# Patient Record
Sex: Male | Born: 1950 | Race: White | Hispanic: No | Marital: Married | Smoking: Never smoker
Health system: Southern US, Community
[De-identification: ages and names within clinical notes are randomized; demographics above are authoritative.]

## PROBLEM LIST (undated history)

## (undated) DIAGNOSIS — I10 Essential (primary) hypertension: Secondary | ICD-10-CM

## (undated) DIAGNOSIS — I83009 Varicose veins of unspecified lower extremity with ulcer of unspecified site: Secondary | ICD-10-CM

## (undated) DIAGNOSIS — M1990 Unspecified osteoarthritis, unspecified site: Secondary | ICD-10-CM

## (undated) DIAGNOSIS — G4730 Sleep apnea, unspecified: Secondary | ICD-10-CM

## (undated) DIAGNOSIS — IMO0002 Reserved for concepts with insufficient information to code with codable children: Secondary | ICD-10-CM

## (undated) DIAGNOSIS — L97909 Non-pressure chronic ulcer of unspecified part of unspecified lower leg with unspecified severity: Secondary | ICD-10-CM

## (undated) HISTORY — DX: Sleep apnea, unspecified: G47.30

## (undated) HISTORY — DX: Varicose veins of unspecified lower extremity with ulcer of unspecified site: L97.909

## (undated) HISTORY — PX: ENDOVENOUS ABLATION SAPHENOUS VEIN W/ LASER: SUR449

## (undated) HISTORY — DX: Varicose veins of unspecified lower extremity with ulcer of unspecified site: I83.009

## (undated) HISTORY — DX: Unspecified osteoarthritis, unspecified site: M19.90

## (undated) HISTORY — DX: Essential (primary) hypertension: I10

## (undated) HISTORY — PX: OTHER SURGICAL HISTORY: SHX169

## (undated) HISTORY — PX: GASTRIC BYPASS: SHX52

## (undated) HISTORY — DX: Reserved for concepts with insufficient information to code with codable children: IMO0002

---

## 2004-06-26 ENCOUNTER — Ambulatory Visit: Payer: Self-pay | Admitting: Internal Medicine

## 2005-07-07 ENCOUNTER — Encounter: Admission: RE | Admit: 2005-07-07 | Discharge: 2005-07-21 | Payer: Self-pay | Admitting: Family Medicine

## 2005-08-28 ENCOUNTER — Encounter: Admission: RE | Admit: 2005-08-28 | Discharge: 2005-09-21 | Payer: Self-pay | Admitting: Family Medicine

## 2005-10-14 ENCOUNTER — Encounter: Admission: RE | Admit: 2005-10-14 | Discharge: 2006-01-12 | Payer: Self-pay | Admitting: Family Medicine

## 2006-11-23 ENCOUNTER — Encounter: Admission: RE | Admit: 2006-11-23 | Discharge: 2006-11-23 | Payer: Self-pay | Admitting: Nephrology

## 2006-12-16 ENCOUNTER — Encounter: Admission: RE | Admit: 2006-12-16 | Discharge: 2006-12-16 | Payer: Self-pay | Admitting: Gastroenterology

## 2007-01-27 ENCOUNTER — Ambulatory Visit (HOSPITAL_COMMUNITY): Admission: RE | Admit: 2007-01-27 | Discharge: 2007-01-27 | Payer: Self-pay | Admitting: Surgery

## 2007-02-03 ENCOUNTER — Ambulatory Visit (HOSPITAL_COMMUNITY): Admission: RE | Admit: 2007-02-03 | Discharge: 2007-02-03 | Payer: Self-pay | Admitting: Surgery

## 2007-03-09 ENCOUNTER — Encounter: Admission: RE | Admit: 2007-03-09 | Discharge: 2007-06-07 | Payer: Self-pay | Admitting: Surgery

## 2007-04-12 ENCOUNTER — Encounter: Admission: RE | Admit: 2007-04-12 | Discharge: 2007-04-12 | Payer: Self-pay | Admitting: Gastroenterology

## 2007-05-10 ENCOUNTER — Encounter (INDEPENDENT_AMBULATORY_CARE_PROVIDER_SITE_OTHER): Payer: Self-pay | Admitting: Surgery

## 2007-05-10 ENCOUNTER — Ambulatory Visit (HOSPITAL_COMMUNITY): Admission: RE | Admit: 2007-05-10 | Discharge: 2007-05-11 | Payer: Self-pay | Admitting: Surgery

## 2007-07-05 ENCOUNTER — Encounter: Admission: RE | Admit: 2007-07-05 | Discharge: 2007-07-05 | Payer: Self-pay | Admitting: Surgery

## 2008-06-19 ENCOUNTER — Encounter: Admission: RE | Admit: 2008-06-19 | Discharge: 2008-06-19 | Payer: Self-pay | Admitting: Gastroenterology

## 2010-03-09 ENCOUNTER — Encounter: Payer: Self-pay | Admitting: Gastroenterology

## 2010-03-10 ENCOUNTER — Encounter: Payer: Self-pay | Admitting: Gastroenterology

## 2010-06-10 ENCOUNTER — Encounter (INDEPENDENT_AMBULATORY_CARE_PROVIDER_SITE_OTHER): Payer: BC Managed Care – PPO

## 2010-06-10 ENCOUNTER — Encounter (INDEPENDENT_AMBULATORY_CARE_PROVIDER_SITE_OTHER): Payer: BC Managed Care – PPO | Admitting: Vascular Surgery

## 2010-06-10 DIAGNOSIS — I83893 Varicose veins of bilateral lower extremities with other complications: Secondary | ICD-10-CM

## 2010-06-11 NOTE — Consult Note (Signed)
NEW PATIENT CONSULTATION  Philip Wolfe, Philip Wolfe DOB:  07/02/1950                                       06/10/2010 WGNFA#:[id number]  Patient presents today for evaluation of lower extremity venous pathology.  He has a long history of progressive swelling, hemosiderin deposit, and now ulceration over his left medial ankle.  He does not have any __________ history of DVT.  He does have pain significantly over the left ankle ulcer, swelling and edema.  He is treating this with Ace wrap and Bactroban ointment.  He has been wearing knee-high compression when possible and does elevate his legs when possible.  He does not have any history of bleeding and has several scattered varices.  PAST HISTORY:  Significant for hypertension.  He is obese and has had gastric bypass in the past.  SOCIAL HISTORY:  He is married with 2 children.  He is a Radio producer.  He does not smoke or drink alcohol.  FAMILY HISTORY:  Negative for premature atherosclerotic disease.  REVIEW OF SYSTEMS:  Weight is 305 pounds.  He is 5 feet 9 inches tall. He does have pain in his legs with walking.  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  Review of systems otherwise negative.  PHYSICAL EXAMINATION:  A well-developed and well-nourished white male appearing his stated age in no acute distress.  Blood pressure is 168/102, heart rate 54, respirations 16.  HEENT is normal.  His radial and dorsalis pedis pulses are 2+ bilaterally.  Abdomen is obese, soft, nontender.  Musculoskeletal shows no major deformities or cyanosis. Neurologic:  No focal weakness or paresthesias.  Skin does have extensive brawny edema from just below his knees down to his ankles bilaterally and a 3 cm ulcer on the medial aspect of his left ankle.  He did undergo noninvasive vascular laboratory studies in our office, and this reveals gross reflux throughout his  left great saphenous vein from the saphenofemoral junction distally.  He does have several incompetent perforators in the calf.  He does have some reflux in the small saphenous vein on the right as well.  No reflux in his left small saphenous vein and no reflux in his great saphenous vein.  He does have some reflux in the deep system as well.  I discussed the significance of this with patient by explaining that his venous stasis changes are related to his venous hypertension.  He has a component of superficial and deep venous reflux.  On images with SonoSite, this does extend down to the area of his pathology on the medial aspect of his left great saphenous vein.  He has not worn thigh- high compression, and we have fitted him with these today and instructed him on the use of these.  He is also given a prescription for Silvadene ointment and instructed on the use of this for his ulcer.  We will see him again in 3 months to determine if conservative treatment is being adequate for improvement of symptoms.  He understands that he may achieve improvement with ablation of the saphenous vein should he fail conservative treatment.    Larina Earthly, M.D. Electronically Signed  TFE/MEDQ  D:  06/11/2010  T:  06/11/2010  Job:  [id number]  cc:   Caryn Bee L. Little, M.D.

## 2010-06-18 NOTE — Procedures (Unsigned)
LOWER EXTREMITY VENOUS REFLUX EXAM  INDICATION:  Varicose veins.  EXAM:  Using color-flow imaging and pulse Doppler spectral analysis, the right and left common femoral, superficial femoral, popliteal, posterior tibial, greater and lesser saphenous veins are evaluated.  There is no evidence suggesting deep venous insufficiency in the right and left lower extremities.  The right greater saphenous vein was not visualized due to stripping. The left saphenofemoral junction is not competent with Reflux of >529milliseconds. The right GSV was not visualized due to a history of stripping.  The left GSV is not competent with a reflux >500 milliseconds with the caliber as described below.  The right proximal small saphenous vein demonstrates incompetency.  The left small saphenous vein was not visualized.  The measurements for the right small saphenous vein measured between 0.68 to 0.40 cm.  GSV Diameter (used if found to be incompetent only)                                           Right    Left Proximal Greater Saphenous Vein           cm       0.79 cm Proximal-to-mid-thigh                     cm       0.71 cm Mid thigh                                 cm       0.86 cm Mid-distal thigh                          cm       cm Distal thigh                              cm       0.78 cm Knee                                      cm       cm  IMPRESSION: 1. The right greater saphenous vein was not visualized. 2. The left greater saphenous vein is not competent with reflux >500     milliseconds. 3. The right great saphenous vein, again, was not visualized.  The     left great saphenous vein is not tortuous. 4. The deep venous system is competent. 5. The right small saphenous vein is not competent with Reflux of     >540milliseconds.  The left small saphenous vein was not     visualized.       ___________________________________________ Larina Earthly, M.D.  OD/MEDQ  D:   06/11/2010  T:  06/11/2010  Job:  [id number]

## 2010-07-01 NOTE — Op Note (Signed)
Philip Wolfe, Philip Wolfe                 ACCOUNT NO.:  [id number]   MEDICAL RECORD NO.:  [id number]          PATIENT TYPE:  OIB   LOCATION:  0098                         FACILITY:  [facility]   PHYSICIAN:  Sandria Bales. Ezzard Standing, M.D.  DATE OF BIRTH:  [birth year]   DATE OF PROCEDURE:  05/10/2007  DATE OF DISCHARGE:                               OPERATIVE REPORT   Date of surgery here ??   PREOPERATIVE DIAGNOSIS:  Morbid obesity with a weight of 334, body mass  index of 49.5.   POSTOPERATIVE DIAGNOSIS:  Morbid obesity with a weight of 334, body mass  index of 49.5, nodule of left lobe of liver.   PROCEDURE:  Lap band placement with AP large band, biopsy of the left  lobe of liver.   SURGEON:  Sandria Bales. Ezzard Standing, M.D.   FIRST ASSISTANT:  Sharlet Salina T. Hoxworth, M.D.   ANESTHESIA:  General endotracheal.   ESTIMATED BLOOD LOSS:  Minimal.   PROCEDURE:  Mr. Yu is a 60 year old white male patient Dr. Henrine Screws who has been morbidly obese much of his adult life.  He is been  through our preop bariatric program and is now ready to go ahead with  lap band placement.   The indications and potential complications of lap band placement have  been discussed with the patient.  The potential complications include,  but are not limited to, bleeding, infection, perforation of bowel,  slippage of the band, erosion of the band, and long-term nutritional  consequences.   OPERATIVE NOTE:  The patient was placed in the supine position and given  a general endotracheal anesthetic.  His was given a gram of Ancef at the  initiation of the procedure.  He had PAS stockings in place.  He abdomen  was prepped with Techni-Care and sterilely draped.  A time-out was held identifying the patient and procedure.   I accessed his abdomen through a left upper quadrant with a 11-mm  Ethicon Optiview trocar.   I placed five additional trocars, a 5-mm subxiphoid trocar for the  liver, a 15-mm trocar in the right  subcostal, an 11-mm in the right  paramedian, and 11-mm trocar in the left paramedian, and a 5-mm trocar  in the left lateral upper abdomen.   I insuflated and explored the abdomen.  The right and left lobes of the  liver were examined.  He had three superficial nodules on the right lobe  of the liver which appeared benign, but he had a nodule with some  retraction of the liver on the left lobe, and I will biopsy this at the  end of the procedure.  The stomach was unremarkable.  The bowel, that I  could see, was unremarkable.   I then looked at the stomach which was unremarkable.  The bowel that I  could see was unremarkable.  Most of it was covered with omentum.  I  then placed a Nathanson retractor in the left lobe of the liver and  retracted this.  I then identified the angle of His, opened that a  little bit.  He  did have a very narrow angle, and his upper abdomen was  a fairly tight space, consistent with a large male.   I then went along the gastrohepatic ligament.  I opened this and then  passed the finger dissector behind the proximal stomach up to the angle  of His.  I then introduced the AP large band and inserted this into the  abdominal cavity.  I passed it around the proximal stomach without  difficulty.   I then visualized the proximal stomach.  The nurse anesthetists, Peggy  Williford, passed down the lap band sizer without difficulty.  I then  cinched the AP large band around the sizer, and then the sizer was  removed.   I then imbricated the stomach over the band using 2-0 Ethibond sutures  in 3 different sutures.   This made the band lay at what appeared to be the appropriate angle.  I  did not think the stomach the wrap was too tight.   I then turned my attention to the liver.  I cauterized around this  little nodule again which was about 8 mm in size and then biopsied this  nodule and sent it to pathology, with pathology pending at the time of  this  dictation.   I then brought out the Silastic tubing out of the abdominal cavity and  attached it to the reservoir.  I sewed the reservoir in place with four  2-0 Prolene sutures and then attached it by the attachment device.   The reservoir lay flat.  The tubing went in easily into his abdomen.  I  then closed the subcutaneous tissues with 3-0 Vicryl suture.  I closed  the skin with a 5-0 Monocryl suture.  I painted the wound with tincture  of benzoin and Steri-Strips.   The patient tolerated the procedure well and was transported to the  recovery room in good condition.  Sponge and needle count were correct  at the end of the case.      Sandria Bales. Ezzard Standing, M.D.  Electronically Signed     DHN/MEDQ  D:  05/10/2007  T:  05/10/2007  Job:  [id number]   cc:   Chales Salmon. Abigail Miyamoto, M.D.  Fax: [phone]   Mindi Slicker. Lowell Guitar, M.D.  Fax: [phone]

## 2010-07-31 ENCOUNTER — Encounter: Payer: Self-pay | Admitting: *Deleted

## 2010-07-31 DIAGNOSIS — L97309 Non-pressure chronic ulcer of unspecified ankle with unspecified severity: Secondary | ICD-10-CM

## 2010-07-31 DIAGNOSIS — I83893 Varicose veins of bilateral lower extremities with other complications: Secondary | ICD-10-CM | POA: Insufficient documentation

## 2010-07-31 DIAGNOSIS — M79606 Pain in leg, unspecified: Secondary | ICD-10-CM | POA: Insufficient documentation

## 2010-08-06 ENCOUNTER — Encounter (INDEPENDENT_AMBULATORY_CARE_PROVIDER_SITE_OTHER): Payer: BC Managed Care – PPO

## 2010-08-06 DIAGNOSIS — I83893 Varicose veins of bilateral lower extremities with other complications: Secondary | ICD-10-CM

## 2010-08-26 ENCOUNTER — Encounter: Payer: Self-pay | Admitting: Vascular Surgery

## 2010-09-05 ENCOUNTER — Encounter: Payer: Self-pay | Admitting: Vascular Surgery

## 2010-09-16 ENCOUNTER — Ambulatory Visit: Payer: BC Managed Care – PPO | Admitting: Vascular Surgery

## 2010-09-17 ENCOUNTER — Encounter: Payer: Self-pay | Admitting: Vascular Surgery

## 2010-09-17 ENCOUNTER — Ambulatory Visit (INDEPENDENT_AMBULATORY_CARE_PROVIDER_SITE_OTHER): Payer: BC Managed Care – PPO | Admitting: Vascular Surgery

## 2010-09-17 VITALS — BP 139/73 | HR 60 | Resp 22

## 2010-09-17 DIAGNOSIS — I83893 Varicose veins of bilateral lower extremities with other complications: Secondary | ICD-10-CM

## 2010-09-17 DIAGNOSIS — I739 Peripheral vascular disease, unspecified: Secondary | ICD-10-CM

## 2010-09-17 DIAGNOSIS — L98499 Non-pressure chronic ulcer of skin of other sites with unspecified severity: Secondary | ICD-10-CM

## 2010-09-17 NOTE — Progress Notes (Signed)
F/U VENOUS STASIS ULCERS

## 2010-09-18 NOTE — Assessment & Plan Note (Signed)
OFFICE VISIT  Philip Wolfe, Philip Wolfe DOB:  1950-12-18                                       09/17/2010 ZOXWR#:[id number]  The patient presents today for continued discussion regarding his bilateral severe venous hypertension.  He fortunately has been able to heal his large medial malleolus venous ulcer.  He continues to have marked swelling and hemosiderin deposit bilaterally.  He reports that he does have pain in both lower extremities with prolonged standing.  He does elevate his legs when possible.  He is extremely compliant with his compression, and continues to have discomfort with pain.  He reports that yard work and housework are difficult due to leg pain.  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  I feel that he has clearly failed conservative therapy and have recommended treatment of his left leg for reduction of his venous hypertension.  By physical examination and by SonoSite evaluation, it appears as though he has had stripping of his great saphenous vein from the level of his knee to his ankle.  The vein is quite dilated above this with multiple tributaries.  I have recommended laser ablation of his left great saphenous vein from his knee to his groin.  He wishes to proceed with this as soon as possible.    Larina Earthly, M.D. Electronically Signed  TFE/MEDQ  D:  09/17/2010  T:  09/18/2010  Job:  [id number]  cc:   Caryn Bee L. Little, M.D.

## 2010-10-07 ENCOUNTER — Encounter: Payer: Self-pay | Admitting: Vascular Surgery

## 2010-11-04 ENCOUNTER — Encounter: Payer: Self-pay | Admitting: Vascular Surgery

## 2010-11-05 ENCOUNTER — Ambulatory Visit (INDEPENDENT_AMBULATORY_CARE_PROVIDER_SITE_OTHER): Payer: BC Managed Care – PPO | Admitting: Vascular Surgery

## 2010-11-05 ENCOUNTER — Encounter: Payer: Self-pay | Admitting: Vascular Surgery

## 2010-11-05 VITALS — BP 132/71 | HR 86 | Resp 16 | Ht 69.0 in | Wt 295.0 lb

## 2010-11-05 DIAGNOSIS — I83893 Varicose veins of bilateral lower extremities with other complications: Secondary | ICD-10-CM

## 2010-11-05 NOTE — Progress Notes (Signed)
Laser Ablation Procedure      Date: 11/05/2010    Philip Wolfe DOB:08/23/1950  Consent signed: Yes  Surgeon:T.F. Early  Procedure: Laser Ablation: left Greater Saphenous Vein  BP 132/71  Pulse 86  Resp 16  Ht 5\' 9"  (1.753 m)  Wt 295 lb (133.811 kg)  BMI 43.56 kg/m2  Start time: 915 AM   End time: 10:20 AM  Tumescent Anesthesia: 425 cc 0.9% NaCl with 50 cc Lidocaine HCL with 1% Epi and 15 cc 8.4% NaHCO3  Local Anesthesia: 4 cc Lidocaine HCL and NaHCO3 (ratio 2:1)  Continuous Mode:  15 Watts  Total Energy :  1695 Joules  Total Time :   1: 53           Patient tolerated procedure well: Yes  Rankin, Neena Rhymes  Description of Procedure:  After marking the course of the saphenous vein and the secondary varicosities in the standing position, the patient was placed on the operating table in the supine position, and the left leg was prepped and draped in sterile fashion. Local anesthetic was administered, and under ultrasound guidance the saphenous vein was accessed with a micro needle and guide wire; then the micro puncture sheath was placed. A guide wire was inserted to the saphenofemoral junction, followed by a 5 french sheath.  The position of the sheath and then the laser fiber below the junction was confirmed using the ultrasound and visualization of the aiming beam.  Tumescent anesthesia was administered along the course of the saphenous vein using ultrasound guidance. Protective laser glasses were placed on the patient, and the laser was fired at at 15 watt continuous mode.  For a total of 1695 joules.  A steri strip was applied to the puncture site.   ABD pads and thigh high compression stockings were applied.  Ace wrap bandages were applied  at the top of the saphenofemoral junction.  Blood loss was less than 15 cc.  The patient ambulated out of the operating room having tolerated the procedure well.  Technically successful ablation from left knee to below the  saphenofemoral junction great saphenous vein

## 2010-11-06 ENCOUNTER — Telehealth: Payer: Self-pay | Admitting: *Deleted

## 2010-11-06 NOTE — Telephone Encounter (Signed)
Mr. Philip Wolfe states he has not experienced any leg pain or swelling.  Reminded Mr. Philip Wolfe to wear compression dressing for 48 hours and then wear compression hose daytime for 2 weeks.  Reminded Mr. Philip Wolfe of his post laser ablation duplex and follow up appointment with Dr. Arbie Cookey on 11-12-2010.  Mr. Philip Wolfe will call if he experiences problems or has questions or concerns.  Takeya Marquis, Neena Rhymes

## 2010-11-10 LAB — DIFFERENTIAL
Basophils Absolute: 0
Basophils Relative: 0
Eosinophils Relative: 0
Lymphs Abs: 1.3
Monocytes Relative: 7
Neutrophils Relative %: 83 — ABNORMAL HIGH

## 2010-11-10 LAB — CBC
HCT: 41.7
Hemoglobin: 14.3
MCHC: 34.4
Platelets: 333
RBC: 4.97
RDW: 14.6
WBC: 12.9 — ABNORMAL HIGH

## 2010-11-10 LAB — BASIC METABOLIC PANEL
BUN: 19
Calcium: 9.3
Chloride: 105
Potassium: 3.8
Sodium: 141

## 2010-11-11 ENCOUNTER — Encounter: Payer: Self-pay | Admitting: Vascular Surgery

## 2010-11-12 ENCOUNTER — Encounter: Payer: Self-pay | Admitting: Vascular Surgery

## 2010-11-12 ENCOUNTER — Encounter (INDEPENDENT_AMBULATORY_CARE_PROVIDER_SITE_OTHER): Payer: BC Managed Care – PPO | Admitting: *Deleted

## 2010-11-12 ENCOUNTER — Ambulatory Visit (INDEPENDENT_AMBULATORY_CARE_PROVIDER_SITE_OTHER): Payer: BC Managed Care – PPO | Admitting: Vascular Surgery

## 2010-11-12 VITALS — BP 145/76 | HR 60 | Resp 16 | Ht 69.0 in | Wt 295.0 lb

## 2010-11-12 DIAGNOSIS — I83893 Varicose veins of bilateral lower extremities with other complications: Secondary | ICD-10-CM

## 2010-11-12 DIAGNOSIS — Z48812 Encounter for surgical aftercare following surgery on the circulatory system: Secondary | ICD-10-CM

## 2010-11-12 NOTE — Progress Notes (Signed)
The patient presents today one week followup of his left great saphenous vein ablation. He did well and has minimal pain associated with this and mild bruising in the medial aspect of his thigh. He has been very compliant with his compression garment.  The venous duplex: This reveals no evidence of DVT and a closure of the saphenous vein and the need to 3 cm distal to the saphenofemoral junction.  I'm pleased with his initial results. He will continue his I compression for one additional week. Encouraged him to continue bilateral knee-high compression due to this chronic venous stasis changes. He will see [place] again on an as-needed basis.

## 2010-11-19 NOTE — Procedures (Unsigned)
DUPLEX DEEP VENOUS EXAM - LOWER EXTREMITY  INDICATION:  One week followup left GSV ablation.  HISTORY:  Edema:  Yes. Trauma/Surgery:  EVLT. Pain:  Minor. PE:  No. Previous DVT:  No. Anticoagulants:  No. Other:  DUPLEX EXAM:               CFV   SFV   PopV  PTV    GSV               R  L  R  L  R  L  R   L  R  L Thrombosis       o     o     o      o     + Spontaneous      +     +     +      +     0 Phasic           +     +     +      +     0 Augmentation     +     +     +      +     0 Compressible     +     +     +      +     0 Competent  Legend:  + - yes  o - no  p - partial  D - decreased  IMPRESSION: 1. No evidence of deep venous thrombosis, status post left greater     saphenous vein ablation. 2. Left greater saphenous vein appears completely closed from     approximately 3 cm distal to the junction through the knee.   _____________________________ Larina Earthly, M.D.  LT/MEDQ  D:  11/12/2010  T:  11/12/2010  Job:  [id number]

## 2011-01-26 ENCOUNTER — Encounter: Payer: Self-pay | Admitting: Vascular Surgery

## 2012-08-15 ENCOUNTER — Other Ambulatory Visit: Payer: Self-pay | Admitting: Dermatology

## 2013-01-05 ENCOUNTER — Ambulatory Visit (INDEPENDENT_AMBULATORY_CARE_PROVIDER_SITE_OTHER): Payer: BC Managed Care – PPO | Admitting: Physician Assistant

## 2013-01-05 ENCOUNTER — Encounter (INDEPENDENT_AMBULATORY_CARE_PROVIDER_SITE_OTHER): Payer: Self-pay

## 2013-01-05 VITALS — BP 126/81 | HR 80 | Temp 98.6°F | Resp 18 | Ht 69.0 in | Wt 315.6 lb

## 2013-01-05 DIAGNOSIS — Z4651 Encounter for fitting and adjustment of gastric lap band: Secondary | ICD-10-CM

## 2013-01-05 NOTE — Patient Instructions (Signed)

## 2013-01-05 NOTE — Progress Notes (Signed)
  HISTORY: Philip Wolfe is a 62 y.o.male who received an AP-Large lap-band in March 2009 by Dr. Ezzard Standing. He comes in with 11 lbs weight gain since his last appointment in March 2010. He describes hunger occurring about 2 hours after a meal in which he can eat over 5 oz protein with two full vegetable portions. He denies regurgitation or reflux. He exercises daily at home on a stationary bike.  VITAL SIGNS: Filed Vitals:   01/05/13 1417  BP: 126/81  Pulse: 80  Temp: 98.6 F (37 C)  Resp: 18    PHYSICAL EXAM: Physical exam reveals a very well-appearing 62 y.o.male in no apparent distress Neurologic: Awake, alert, oriented Psych: Bright affect, conversant Respiratory: Breathing even and unlabored. No stridor or wheezing Abdomen: Soft, nontender, nondistended to palpation. Incisions well-healed. No incisional hernias. Port easily palpated. Extremities: Atraumatic, good range of motion.  ASSESMENT: 62 y.o.  male  s/p AP-Large lap-band.   PLAN: The patient's port was accessed with a 20G Huber needle without difficulty. Clear fluid was aspirated and 0.5 mL saline was added to the port to give a total predicted volume of 7.5 mL. The patient was able to swallow water without difficulty following the procedure and was instructed to take clear liquids for the next 24-48 hours and advance slowly as tolerated.

## 2013-03-02 ENCOUNTER — Encounter (INDEPENDENT_AMBULATORY_CARE_PROVIDER_SITE_OTHER): Payer: BC Managed Care – PPO

## 2016-10-29 ENCOUNTER — Other Ambulatory Visit: Payer: Self-pay | Admitting: Gastroenterology

## 2016-10-29 DIAGNOSIS — Z1211 Encounter for screening for malignant neoplasm of colon: Secondary | ICD-10-CM

## 2016-11-13 ENCOUNTER — Ambulatory Visit
Admission: RE | Admit: 2016-11-13 | Discharge: 2016-11-13 | Disposition: A | Discharge status: Home / Self Care | Payer: BC Managed Care – PPO | Source: Ambulatory Visit | Attending: Gastroenterology | Admitting: Gastroenterology

## 2016-11-13 DIAGNOSIS — Z1211 Encounter for screening for malignant neoplasm of colon: Secondary | ICD-10-CM

## 2016-12-02 ENCOUNTER — Ambulatory Visit
Admission: RE | Admit: 2016-12-02 | Discharge: 2016-12-02 | Disposition: A | Discharge status: Home / Self Care | Payer: BC Managed Care – PPO | Source: Ambulatory Visit | Attending: Gastroenterology | Admitting: Gastroenterology

## 2019-01-23 ENCOUNTER — Inpatient Hospital Stay (HOSPITAL_COMMUNITY): Payer: BC Managed Care – PPO

## 2019-01-23 ENCOUNTER — Emergency Department (HOSPITAL_COMMUNITY): Payer: BC Managed Care – PPO

## 2019-01-23 ENCOUNTER — Encounter (HOSPITAL_COMMUNITY): Payer: Self-pay | Admitting: Emergency Medicine

## 2019-01-23 ENCOUNTER — Inpatient Hospital Stay (HOSPITAL_COMMUNITY)
Admission: EM | Admit: 2019-01-23 | Discharge: 2019-02-17 | DRG: 003 | Disposition: E | Payer: BC Managed Care – PPO | Attending: Internal Medicine | Admitting: Internal Medicine

## 2019-01-23 ENCOUNTER — Other Ambulatory Visit: Payer: Self-pay

## 2019-01-23 ENCOUNTER — Other Ambulatory Visit (HOSPITAL_COMMUNITY): Payer: Self-pay | Admitting: Radiology

## 2019-01-23 DIAGNOSIS — E162 Hypoglycemia, unspecified: Secondary | ICD-10-CM | POA: Diagnosis not present

## 2019-01-23 DIAGNOSIS — I82442 Acute embolism and thrombosis of left tibial vein: Secondary | ICD-10-CM | POA: Diagnosis present

## 2019-01-23 DIAGNOSIS — I82432 Acute embolism and thrombosis of left popliteal vein: Secondary | ICD-10-CM | POA: Diagnosis present

## 2019-01-23 DIAGNOSIS — Z66 Do not resuscitate: Secondary | ICD-10-CM

## 2019-01-23 DIAGNOSIS — J9600 Acute respiratory failure, unspecified whether with hypoxia or hypercapnia: Secondary | ICD-10-CM

## 2019-01-23 DIAGNOSIS — Z20828 Contact with and (suspected) exposure to other viral communicable diseases: Secondary | ICD-10-CM | POA: Diagnosis present

## 2019-01-23 DIAGNOSIS — E874 Mixed disorder of acid-base balance: Secondary | ICD-10-CM | POA: Diagnosis present

## 2019-01-23 DIAGNOSIS — J90 Pleural effusion, not elsewhere classified: Secondary | ICD-10-CM | POA: Diagnosis not present

## 2019-01-23 DIAGNOSIS — Z7189 Other specified counseling: Secondary | ICD-10-CM | POA: Diagnosis not present

## 2019-01-23 DIAGNOSIS — R739 Hyperglycemia, unspecified: Secondary | ICD-10-CM | POA: Diagnosis not present

## 2019-01-23 DIAGNOSIS — D696 Thrombocytopenia, unspecified: Secondary | ICD-10-CM | POA: Diagnosis not present

## 2019-01-23 DIAGNOSIS — M1990 Unspecified osteoarthritis, unspecified site: Secondary | ICD-10-CM | POA: Diagnosis present

## 2019-01-23 DIAGNOSIS — K567 Ileus, unspecified: Secondary | ICD-10-CM

## 2019-01-23 DIAGNOSIS — R570 Cardiogenic shock: Secondary | ICD-10-CM | POA: Diagnosis present

## 2019-01-23 DIAGNOSIS — J9601 Acute respiratory failure with hypoxia: Secondary | ICD-10-CM | POA: Diagnosis present

## 2019-01-23 DIAGNOSIS — Z9884 Bariatric surgery status: Secondary | ICD-10-CM

## 2019-01-23 DIAGNOSIS — D62 Acute posthemorrhagic anemia: Secondary | ICD-10-CM | POA: Diagnosis not present

## 2019-01-23 DIAGNOSIS — Z9281 Personal history of extracorporeal membrane oxygenation (ECMO): Secondary | ICD-10-CM

## 2019-01-23 DIAGNOSIS — Z9689 Presence of other specified functional implants: Secondary | ICD-10-CM

## 2019-01-23 DIAGNOSIS — I4891 Unspecified atrial fibrillation: Secondary | ICD-10-CM | POA: Diagnosis present

## 2019-01-23 DIAGNOSIS — I5031 Acute diastolic (congestive) heart failure: Secondary | ICD-10-CM | POA: Diagnosis not present

## 2019-01-23 DIAGNOSIS — Z515 Encounter for palliative care: Secondary | ICD-10-CM | POA: Diagnosis not present

## 2019-01-23 DIAGNOSIS — T45615A Adverse effect of thrombolytic drugs, initial encounter: Secondary | ICD-10-CM | POA: Diagnosis not present

## 2019-01-23 DIAGNOSIS — G4733 Obstructive sleep apnea (adult) (pediatric): Secondary | ICD-10-CM | POA: Diagnosis present

## 2019-01-23 DIAGNOSIS — Z6841 Body Mass Index (BMI) 40.0 and over, adult: Secondary | ICD-10-CM

## 2019-01-23 DIAGNOSIS — I468 Cardiac arrest due to other underlying condition: Secondary | ICD-10-CM | POA: Diagnosis not present

## 2019-01-23 DIAGNOSIS — Z452 Encounter for adjustment and management of vascular access device: Secondary | ICD-10-CM

## 2019-01-23 DIAGNOSIS — Z9889 Other specified postprocedural states: Secondary | ICD-10-CM

## 2019-01-23 DIAGNOSIS — Z8249 Family history of ischemic heart disease and other diseases of the circulatory system: Secondary | ICD-10-CM

## 2019-01-23 DIAGNOSIS — J989 Respiratory disorder, unspecified: Secondary | ICD-10-CM | POA: Diagnosis not present

## 2019-01-23 DIAGNOSIS — T508X5A Adverse effect of diagnostic agents, initial encounter: Secondary | ICD-10-CM | POA: Diagnosis not present

## 2019-01-23 DIAGNOSIS — R040 Epistaxis: Secondary | ICD-10-CM | POA: Diagnosis not present

## 2019-01-23 DIAGNOSIS — R041 Hemorrhage from throat: Secondary | ICD-10-CM | POA: Diagnosis not present

## 2019-01-23 DIAGNOSIS — N179 Acute kidney failure, unspecified: Secondary | ICD-10-CM | POA: Diagnosis not present

## 2019-01-23 DIAGNOSIS — Z96651 Presence of right artificial knee joint: Secondary | ICD-10-CM | POA: Diagnosis present

## 2019-01-23 DIAGNOSIS — L8990 Pressure ulcer of unspecified site, unspecified stage: Secondary | ICD-10-CM | POA: Insufficient documentation

## 2019-01-23 DIAGNOSIS — R34 Anuria and oliguria: Secondary | ICD-10-CM | POA: Diagnosis not present

## 2019-01-23 DIAGNOSIS — N141 Nephropathy induced by other drugs, medicaments and biological substances: Secondary | ICD-10-CM | POA: Diagnosis not present

## 2019-01-23 DIAGNOSIS — N170 Acute kidney failure with tubular necrosis: Secondary | ICD-10-CM | POA: Diagnosis not present

## 2019-01-23 DIAGNOSIS — I2602 Saddle embolus of pulmonary artery with acute cor pulmonale: Principal | ICD-10-CM | POA: Diagnosis present

## 2019-01-23 DIAGNOSIS — Z4589 Encounter for adjustment and management of other implanted devices: Secondary | ICD-10-CM | POA: Diagnosis not present

## 2019-01-23 DIAGNOSIS — I2789 Other specified pulmonary heart diseases: Secondary | ICD-10-CM | POA: Diagnosis not present

## 2019-01-23 DIAGNOSIS — I2699 Other pulmonary embolism without acute cor pulmonale: Secondary | ICD-10-CM | POA: Diagnosis not present

## 2019-01-23 DIAGNOSIS — Z4659 Encounter for fitting and adjustment of other gastrointestinal appliance and device: Secondary | ICD-10-CM

## 2019-01-23 DIAGNOSIS — I82452 Acute embolism and thrombosis of left peroneal vein: Secondary | ICD-10-CM | POA: Diagnosis present

## 2019-01-23 DIAGNOSIS — J9602 Acute respiratory failure with hypercapnia: Secondary | ICD-10-CM | POA: Diagnosis present

## 2019-01-23 DIAGNOSIS — Z9289 Personal history of other medical treatment: Secondary | ICD-10-CM

## 2019-01-23 DIAGNOSIS — I2692 Saddle embolus of pulmonary artery without acute cor pulmonale: Secondary | ICD-10-CM

## 2019-01-23 DIAGNOSIS — I5023 Acute on chronic systolic (congestive) heart failure: Secondary | ICD-10-CM | POA: Diagnosis not present

## 2019-01-23 DIAGNOSIS — I110 Hypertensive heart disease with heart failure: Secondary | ICD-10-CM | POA: Diagnosis present

## 2019-01-23 DIAGNOSIS — I50810 Right heart failure, unspecified: Secondary | ICD-10-CM | POA: Diagnosis present

## 2019-01-23 DIAGNOSIS — Z419 Encounter for procedure for purposes other than remedying health state, unspecified: Secondary | ICD-10-CM

## 2019-01-23 DIAGNOSIS — R579 Shock, unspecified: Secondary | ICD-10-CM

## 2019-01-23 LAB — POCT I-STAT 7, (LYTES, BLD GAS, ICA,H+H)
Acid-base deficit: 8 mmol/L — ABNORMAL HIGH (ref 0.0–2.0)
Acid-base deficit: 8 mmol/L — ABNORMAL HIGH (ref 0.0–2.0)
Acid-base deficit: 7 mmol/L — ABNORMAL HIGH (ref 0.0–2.0)
Bicarbonate: 17.8 mmol/L — ABNORMAL LOW (ref 20.0–28.0)
Bicarbonate: 24.6 mmol/L (ref 20.0–28.0)
Bicarbonate: 22.5 mmol/L (ref 20.0–28.0)
Calcium, Ion: 1.3 mmol/L (ref 1.15–1.40)
Calcium, Ion: 1.2 mmol/L (ref 1.15–1.40)
Calcium, Ion: 1.16 mmol/L (ref 1.15–1.40)
HCT: 49 % (ref 39.0–52.0)
HCT: 46 % (ref 39.0–52.0)
HCT: 46 % (ref 39.0–52.0)
Hemoglobin: 16.7 g/dL (ref 13.0–17.0)
Hemoglobin: 15.6 g/dL (ref 13.0–17.0)
Hemoglobin: 15.6 g/dL (ref 13.0–17.0)
O2 Saturation: 76 %
O2 Saturation: 84 %
O2 Saturation: 92 %
Patient temperature: 97.4
Patient temperature: 98.6
Patient temperature: 98
Potassium: 3.7 mmol/L (ref 3.5–5.1)
Potassium: 4.9 mmol/L (ref 3.5–5.1)
Potassium: 4.2 mmol/L (ref 3.5–5.1)
Sodium: 139 mmol/L (ref 135–145)
Sodium: 141 mmol/L (ref 135–145)
Sodium: 140 mmol/L (ref 135–145)
TCO2: 19 mmol/L — ABNORMAL LOW (ref 22–32)
TCO2: 27 mmol/L (ref 22–32)
TCO2: 24 mmol/L (ref 22–32)
pCO2 arterial: 36.8 mmHg (ref 32.0–48.0)
pCO2 arterial: 82.2 mmHg (ref 32.0–48.0)
pCO2 arterial: 59.6 mmHg — ABNORMAL HIGH (ref 32.0–48.0)
pH, Arterial: 7.288 — ABNORMAL LOW (ref 7.350–7.450)
pH, Arterial: 7.084 — CL (ref 7.350–7.450)
pH, Arterial: 7.183 — CL (ref 7.350–7.450)
pO2, Arterial: 43 mmHg — ABNORMAL LOW (ref 83.0–108.0)
pO2, Arterial: 69 mmHg — ABNORMAL LOW (ref 83.0–108.0)
pO2, Arterial: 80 mmHg — ABNORMAL LOW (ref 83.0–108.0)

## 2019-01-23 LAB — COMPREHENSIVE METABOLIC PANEL
ALT: 39 U/L (ref 0–44)
AST: 42 U/L — ABNORMAL HIGH (ref 15–41)
Albumin: 4 g/dL (ref 3.5–5.0)
Alkaline Phosphatase: 91 U/L (ref 38–126)
Anion gap: 15 (ref 5–15)
BUN: 40 mg/dL — ABNORMAL HIGH (ref 8–23)
CO2: 17 mmol/L — ABNORMAL LOW (ref 22–32)
Calcium: 9.6 mg/dL (ref 8.9–10.3)
Chloride: 107 mmol/L (ref 98–111)
Creatinine, Ser: 1.51 mg/dL — ABNORMAL HIGH (ref 0.61–1.24)
GFR calc Af Amer: 55 mL/min — ABNORMAL LOW (ref >60)
GFR calc non Af Amer: 47 mL/min — ABNORMAL LOW (ref >60)
Glucose, Bld: 200 mg/dL — ABNORMAL HIGH (ref 70–99)
Potassium: 3.7 mmol/L (ref 3.5–5.1)
Sodium: 139 mmol/L (ref 135–145)
Total Bilirubin: 0.4 mg/dL (ref 0.3–1.2)
Total Protein: 8.1 g/dL (ref 6.5–8.1)

## 2019-01-23 LAB — BASIC METABOLIC PANEL
Anion gap: 15 (ref 5–15)
BUN: 42 mg/dL — ABNORMAL HIGH (ref 8–23)
CO2: 20 mmol/L — ABNORMAL LOW (ref 22–32)
Calcium: 7.9 mg/dL — ABNORMAL LOW (ref 8.9–10.3)
Chloride: 107 mmol/L (ref 98–111)
Creatinine, Ser: 1.8 mg/dL — ABNORMAL HIGH (ref 0.61–1.24)
GFR calc Af Amer: 44 mL/min — ABNORMAL LOW (ref >60)
GFR calc non Af Amer: 38 mL/min — ABNORMAL LOW (ref >60)
Glucose, Bld: 242 mg/dL — ABNORMAL HIGH (ref 70–99)
Potassium: 4.3 mmol/L (ref 3.5–5.1)
Sodium: 142 mmol/L (ref 135–145)

## 2019-01-23 LAB — CBC WITH DIFFERENTIAL/PLATELET
Abs Immature Granulocytes: 0.11 10*3/uL — ABNORMAL HIGH (ref 0.00–0.07)
Basophils Absolute: 0.1 10*3/uL (ref 0.0–0.1)
Basophils Relative: 0 %
Eosinophils Absolute: 0.3 10*3/uL (ref 0.0–0.5)
Eosinophils Relative: 2 %
HCT: 52 % (ref 39.0–52.0)
Hemoglobin: 16.6 g/dL (ref 13.0–17.0)
Immature Granulocytes: 1 %
Lymphocytes Relative: 20 %
Lymphs Abs: 3 10*3/uL (ref 0.7–4.0)
MCH: 30.1 pg (ref 26.0–34.0)
MCHC: 31.9 g/dL (ref 30.0–36.0)
MCV: 94.2 fL (ref 80.0–100.0)
Monocytes Absolute: 1.5 10*3/uL — ABNORMAL HIGH (ref 0.1–1.0)
Monocytes Relative: 10 %
Neutro Abs: 10.6 10*3/uL — ABNORMAL HIGH (ref 1.7–7.7)
Neutrophils Relative %: 67 %
Platelets: 242 10*3/uL (ref 150–400)
RBC: 5.52 MIL/uL (ref 4.22–5.81)
RDW: 13.6 % (ref 11.5–15.5)
WBC: 15.6 10*3/uL — ABNORMAL HIGH (ref 4.0–10.5)
nRBC: 0 % (ref 0.0–0.2)

## 2019-01-23 LAB — PROTIME-INR
INR: 1.2 (ref 0.8–1.2)
Prothrombin Time: 14.7 seconds (ref 11.4–15.2)

## 2019-01-23 LAB — BRAIN NATRIURETIC PEPTIDE: B Natriuretic Peptide: 57.6 pg/mL (ref 0.0–100.0)

## 2019-01-23 LAB — ABO/RH: ABO/RH(D): A POS

## 2019-01-23 LAB — TROPONIN I (HIGH SENSITIVITY)
Troponin I (High Sensitivity): 35 ng/L — ABNORMAL HIGH (ref <18)
Troponin I (High Sensitivity): 315 ng/L (ref <18)

## 2019-01-23 LAB — CBC
HCT: 47.1 % (ref 39.0–52.0)
Hemoglobin: 14.7 g/dL (ref 13.0–17.0)
MCH: 29.3 pg (ref 26.0–34.0)
MCHC: 31.2 g/dL (ref 30.0–36.0)
MCV: 94 fL (ref 80.0–100.0)
Platelets: 250 10*3/uL (ref 150–400)
RBC: 5.01 MIL/uL (ref 4.22–5.81)
RDW: 13.7 % (ref 11.5–15.5)
WBC: 27.4 10*3/uL — ABNORMAL HIGH (ref 4.0–10.5)
nRBC: 0 % (ref 0.0–0.2)

## 2019-01-23 LAB — LACTIC ACID, PLASMA
Lactic Acid, Venous: 5.5 mmol/L (ref 0.5–1.9)
Lactic Acid, Venous: 2.5 mmol/L (ref 0.5–1.9)

## 2019-01-23 LAB — RESPIRATORY PANEL BY RT PCR (FLU A&B, COVID)
Influenza A by PCR: NEGATIVE
Influenza B by PCR: NEGATIVE
SARS Coronavirus 2 by RT PCR: NEGATIVE

## 2019-01-23 LAB — I-STAT CREATININE, ED: Creatinine, Ser: 1.3 mg/dL — ABNORMAL HIGH (ref 0.61–1.24)

## 2019-01-23 LAB — D-DIMER, QUANTITATIVE: D-Dimer, Quant: >20 ug/mL-FEU — ABNORMAL HIGH (ref 0.00–0.50)

## 2019-01-23 LAB — POC SARS CORONAVIRUS 2 AG -  ED: SARS Coronavirus 2 Ag: NEGATIVE

## 2019-01-23 MED ORDER — INSULIN ASPART 100 UNIT/ML ~~LOC~~ SOLN
2.0000 [IU] | SUBCUTANEOUS | Status: DC
  Administered 2019-01-24: 6 [IU] via SUBCUTANEOUS

## 2019-01-23 MED ORDER — HEPARIN BOLUS VIA INFUSION
7500.0000 [IU] | Freq: Once | INTRAVENOUS | Status: DC
  Filled 2019-01-23: qty 7500

## 2019-01-23 MED ORDER — NOREPINEPHRINE BITARTRATE 1 MG/ML IV SOLN
INTRAVENOUS | Status: AC | PRN
  Administered 2019-01-23: 10 ug via INTRAVENOUS

## 2019-01-23 MED ORDER — METHYLPREDNISOLONE SODIUM SUCC 125 MG IJ SOLR
125.0000 mg | Freq: Once | INTRAMUSCULAR | Status: AC
  Administered 2019-01-23: 125 mg via INTRAVENOUS
  Filled 2019-01-23: qty 2

## 2019-01-23 MED ORDER — ETOMIDATE 2 MG/ML IV SOLN
INTRAVENOUS | Status: AC | PRN
  Administered 2019-01-23: 40 mg via INTRAVENOUS

## 2019-01-23 MED ORDER — HEPARIN (PORCINE) 25000 UT/250ML-% IV SOLN: 1750.0000 [IU]/h | INTRAVENOUS | Status: DC

## 2019-01-23 MED ORDER — FENTANYL CITRATE (PF) 100 MCG/2ML IJ SOLN
INTRAMUSCULAR | Status: AC
  Filled 2019-01-23: qty 2

## 2019-01-23 MED ORDER — CHLORHEXIDINE GLUCONATE 0.12% ORAL RINSE (MEDLINE KIT)
15.0000 mL | Freq: Two times a day (BID) | OROMUCOSAL | Status: DC
  Administered 2019-01-24 (×2): 15 mL via OROMUCOSAL

## 2019-01-23 MED ORDER — PROPOFOL 1000 MG/100ML IV EMUL
INTRAVENOUS | Status: AC
  Filled 2019-01-23: qty 100

## 2019-01-23 MED ORDER — ALTEPLASE (PULMONARY EMBOLISM) INFUSION
50.0000 mg | Freq: Once | INTRAVENOUS | Status: DC
  Filled 2019-01-23: qty 50

## 2019-01-23 MED ORDER — SODIUM CHLORIDE 0.9 % IV SOLN
1.0000 g | Freq: Once | INTRAVENOUS | Status: AC
  Administered 2019-01-23: 1 g via INTRAVENOUS
  Filled 2019-01-23: qty 10

## 2019-01-23 MED ORDER — PANTOPRAZOLE SODIUM 40 MG IV SOLR
40.0000 mg | Freq: Every day | INTRAVENOUS | Status: DC
  Administered 2019-01-24: 40 mg via INTRAVENOUS
  Filled 2019-01-23: qty 40

## 2019-01-23 MED ORDER — FENTANYL CITRATE (PF) 100 MCG/2ML IJ SOLN: 25.0000 ug | INTRAMUSCULAR | Status: DC | PRN

## 2019-01-23 MED ORDER — ALTEPLASE (PULMONARY EMBOLISM) INFUSION
50.0000 mg | Freq: Once | INTRAVENOUS | Status: AC
  Administered 2019-01-23: 50 mg via INTRAVENOUS
  Filled 2019-01-23: qty 50

## 2019-01-23 MED ORDER — IOHEXOL 350 MG/ML SOLN
75.0000 mL | Freq: Once | INTRAVENOUS | Status: AC | PRN
  Administered 2019-01-23: 75 mL via INTRAVENOUS

## 2019-01-23 MED ORDER — PHENYLEPHRINE HCL (PRESSORS) 10 MG/ML IV SOLN
INTRAVENOUS | Status: AC | PRN
  Administered 2019-01-23: 120 ug

## 2019-01-23 MED ORDER — FENTANYL CITRATE (PF) 100 MCG/2ML IJ SOLN
INTRAMUSCULAR | Status: AC | PRN
  Administered 2019-01-23: 100 ug via INTRAVENOUS

## 2019-01-23 MED ORDER — MIDAZOLAM HCL 5 MG/5ML IJ SOLN
INTRAMUSCULAR | Status: AC | PRN
  Administered 2019-01-23: 4 mg via INTRAVENOUS

## 2019-01-23 MED ORDER — SUCCINYLCHOLINE CHLORIDE 20 MG/ML IJ SOLN
INTRAMUSCULAR | Status: AC | PRN
  Administered 2019-01-23: 200 mg via INTRAVENOUS

## 2019-01-23 MED ORDER — EPINEPHRINE 1 MG/10ML IJ SOSY
PREFILLED_SYRINGE | INTRAMUSCULAR | Status: AC
  Filled 2019-01-23: qty 10

## 2019-01-23 MED ORDER — SODIUM CHLORIDE 0.9% IV SOLUTION: Freq: Once | INTRAVENOUS | Status: DC

## 2019-01-23 MED ORDER — PROPOFOL 1000 MG/100ML IV EMUL
0.0000 ug/kg/min | INTRAVENOUS | Status: DC
  Administered 2019-01-24: 20 ug/kg/min via INTRAVENOUS
  Administered 2019-01-24: 10 ug/kg/min via INTRAVENOUS
  Administered 2019-01-24 (×2): 25 ug/kg/min via INTRAVENOUS
  Filled 2019-01-23 (×3): qty 100

## 2019-01-23 MED ORDER — SODIUM CHLORIDE 0.9 % IV SOLN
50.0000 mg | Freq: Once | INTRAVENOUS | Status: AC
  Administered 2019-01-23: 50 mg via INTRAVENOUS
  Filled 2019-01-23: qty 50

## 2019-01-23 MED ORDER — MIDAZOLAM HCL 2 MG/2ML IJ SOLN
INTRAMUSCULAR | Status: AC
  Filled 2019-01-23: qty 4

## 2019-01-23 MED ORDER — ORAL CARE MOUTH RINSE
15.0000 mL | OROMUCOSAL | Status: DC
  Administered 2019-01-24 (×5): 15 mL via OROMUCOSAL

## 2019-01-23 MED ORDER — FENTANYL CITRATE (PF) 100 MCG/2ML IJ SOLN
INTRAMUSCULAR | Status: AC | PRN
  Administered 2019-01-23 (×2): 50 ug via INTRAVENOUS

## 2019-01-23 MED ORDER — VECURONIUM BROMIDE 10 MG IV SOLR
INTRAVENOUS | Status: AC | PRN
  Administered 2019-01-23: 10 ug via INTRAVENOUS

## 2019-01-23 MED ORDER — LORAZEPAM 2 MG/ML IJ SOLN
0.5000 mg | Freq: Once | INTRAMUSCULAR | Status: AC
  Administered 2019-01-23: 0.5 mg via INTRAVENOUS
  Filled 2019-01-23: qty 1

## 2019-01-23 MED ORDER — PHENYLEPHRINE HCL (PRESSORS) 10 MG/ML IV SOLN
INTRAVENOUS | Status: AC | PRN
  Administered 2019-01-23 (×2): 120 ug
  Administered 2019-01-23: 40 ug

## 2019-01-23 MED ORDER — SODIUM CHLORIDE 0.9 % IV SOLN
500.0000 mg | Freq: Once | INTRAVENOUS | Status: AC
  Administered 2019-01-23: 500 mg via INTRAVENOUS
  Filled 2019-01-23: qty 500

## 2019-01-23 MED ORDER — CHLORHEXIDINE GLUCONATE CLOTH 2 % EX PADS: 6.0000 | MEDICATED_PAD | Freq: Every day | CUTANEOUS | Status: DC

## 2019-01-23 MED ORDER — PROPOFOL 1000 MG/100ML IV EMUL
INTRAVENOUS | Status: AC | PRN
  Administered 2019-01-23: 40 ug/kg/min via INTRAVENOUS

## 2019-01-23 MED ORDER — FENTANYL CITRATE (PF) 100 MCG/2ML IJ SOLN
25.0000 ug | INTRAMUSCULAR | Status: DC | PRN
  Administered 2019-01-24: 100 ug via INTRAVENOUS
  Administered 2019-01-24: 25 ug via INTRAVENOUS
  Filled 2019-01-23 (×2): qty 2

## 2019-01-23 NOTE — Code Documentation (Signed)
7500 unit bolus heprin

## 2019-01-23 NOTE — Code Documentation (Signed)
Took pt off vent, bagging in process

## 2019-01-23 NOTE — ED Notes (Signed)
Art line placed in left arm.

## 2019-01-23 NOTE — H&P (Addendum)
NAME:  Philip Wolfe, MRN:  [id number], DOB:  01/31/1951, LOS: 0 ADMISSION DATE:  02/11/2019, CONSULTATION DATE:  01/22/2019 REFERRING MD:  Dr. Ralene Bathe EDP, CHIEF COMPLAINT:  Hypoxia   Brief History   68 year old male admitted 12/7 with hypoxic respiratory failure of unclear origin with ddx including PE, PNA, COVID-19 infection.  History of present illness   68 year old male with past medical history as below, which is significant for hypertension, obstructive sleep apnea on CPAP, and prediabetes.  Of note he has presented 3 times recently to his PCP for Covid testing with concerns of exposures.  Most recently on November 22.  He presented to [facility] on 12/7 with complaints of acute onset shortness of breath.  He reports the symptoms have been intermittent and progressive over the past few weeks, but became very severe in the several hours leading to his presentation.  Upon arrival to the emergency department his oxygen saturations were in the 60s on a nonrebreather.  There was concern for coronavirus due to his relative comfort despite O2 sats in the 60s.  His POC COVID test came back negative and he was started on BiPAP with some improvement in the symptoms and oxygen saturations to the low 90s. CXR was relatively clear considering his symptoms and there was concern for PE. CT angiogram was ordered and PCCM was asked to evaluate for admission.   On evaluation in ED patient in respiratory distress on BiPAP with oxygen saturation 72-80%. Intubated by PCCM. Oxygen saturation 30-60% while Bagging with PEEP at 20. Progressively hypoxic, coded for about 5 minutes. Given TPA.   Past Medical History   has a past medical history of Arthritis, Hypertension, Sleep apnea, Ulcer (left medial ankle), and Venous stasis ulcer ([place]).   Significant Hospital Events   12/7 admit   Consults:    Procedures:  ETT 12/7 >> Right Femoral CVC 12/7 >> Left Radial Aline 12/7 >>  Significant Diagnostic Tests:   CTA Chest 12/7 > Massive bilateral central pulmonary emboli with significant decrease in pulmonary arterial flow. Right heart strain is noted. Patchy ground-glass infiltrate in the left upper lobe. Patient has negative COVID-19 status and this may represent acute pneumonic infiltrate or sequelae from the known pulmonary emboli. Undisplaced rib fractures on the right as described without Pneumothorax. No other focal abnormality is noted. CT Head 12/7 > Generalized atrophy and chronic ischemic microangiopathy without acute intracranial abnormality.  Micro Data:  Blood 12/7 >> U/A 12/7 >>   Antimicrobials:  Azithromycin 12/7 Rocephin 12/7  Interim history/subjective:    Objective   Blood pressure 134/79, pulse 86, temperature (!) 97.4 F (36.3 C), temperature source Oral, resp. rate (!) 24, height 5\' 9"  (1.753 m), weight (!) 137 kg, SpO2 (!) 88 %.       No intake or output data in the 24 hours ending 02/08/2019 1809 Filed Weights   01/22/2019 1444  Weight: (!) 137 kg    Examination: General: Adult male, intubated/sedated HENT: ETT in place  Lungs: Diminished breath sounds Cardiovascular: Loletha Grayer, no MRG Abdomen: Obese, soft, active bowel sounds Extremities: chronic venous stasis, +3 BLE edema Neuro: sedated, does not follow commands, foley in place   GU: foley in place   Resolved Hospital Problem list     Assessment & Plan:   Acute Hypoxic/Hypercarbic Respiratory Distress in setting of Massive Pulmonary Embolism s/p TPA H/O OSA, CPAP at HS  Plan -Vent Support -Trend ABG/CXR -Have Contacted IR ???EKOS >>> ?? Effectiveness after TPA,  They will call back  -Will start Heparin gtt once APTT <80  -ECHO, Lower Extremity Doppler Pending   Left Upper Lobe Infiltrate  Plan -Trend WBC and Fever Curve -Follow Culture Data -Continue Rocephin/Azithromycin for CAP coverage   PEA Cardiac Arrest in setting of Profound Hypoxia Plan -Cardiac Monitoring -ECHO as above  -Trend  Troponin   Metabolic Acidosis, Lactic Acidosis secondary to hypoperfusion?? Acute Kidney Injury  Plan -Start Bicarb gtt -Trend BMP, Mag Level Pending   DM Plan -Trend Glucose -SSI    Best practice:  Diet: NPO Pain/Anxiety/Delirium protocol (if indicated): Propofol, PRN Fentanyl  VAP protocol (if indicated) DVT prophylaxis: Systemic  GI prophylaxis: PPI Glucose control: SSI Mobility: Bedrest Code Status: FC Family Communication: Wife Updated  Disposition: Admit to ICU   Labs   CBC: Recent Labs  Lab 02/06/2019 1504 02/06/2019 1505  WBC 15.6*  --   NEUTROABS 10.6*  --   HGB 16.6 16.7  HCT 52.0 49.0  MCV 94.2  --   PLT 242  --     Basic Metabolic Panel: Recent Labs  Lab 01/22/2019 1504 02/05/2019 1505 02/09/2019 1507  NA 139 139  --   K 3.7 3.7  --   CL 107  --   --   CO2 17*  --   --   GLUCOSE 200*  --   --   BUN 40*  --   --   CREATININE 1.51*  --  1.30*  CALCIUM 9.6  --   --    GFR: Estimated Creatinine Clearance: 75.8 mL/min (A) (by C-G formula based on SCr of 1.3 mg/dL (H)). Recent Labs  Lab 01/28/2019 1504 02/15/2019 1505  WBC 15.6*  --   LATICACIDVEN  --  5.5*    Liver Function Tests: Recent Labs  Lab 01/22/2019 1504  AST 42*  ALT 39  ALKPHOS 91  BILITOT 0.4  PROT 8.1  ALBUMIN 4.0   No results for input(s): LIPASE, AMYLASE in the last 168 hours. No results for input(s): AMMONIA in the last 168 hours.  ABG    Component Value Date/Time   PHART 7.288 (L) 02/12/2019 1505   PCO2ART 36.8 02/11/2019 1505   PO2ART 43.0 (L) 02/10/2019 1505   HCO3 17.8 (L) 01/29/2019 1505   TCO2 19 (L) 02/05/2019 1505   ACIDBASEDEF 8.0 (H) 01/18/2019 1505   O2SAT 76.0 02/07/2019 1505     Coagulation Profile: Recent Labs  Lab 01/21/2019 1504  INR 1.2    Cardiac Enzymes: No results for input(s): CKTOTAL, CKMB, CKMBINDEX, TROPONINI in the last 168 hours.  HbA1C: No results found for: HGBA1C  CBG: No results for input(s): GLUCAP in the last 168 hours.   Review of Systems:   Unable to review as patient is intubated/sedated   Past Medical History  He,  has a past medical history of Arthritis, Hypertension, Sleep apnea, Ulcer (left medial ankle), and Venous stasis ulcer (HCC).   Surgical History    Past Surgical History:  Procedure Laterality Date  . ENDOVENOUS ABLATION SAPHENOUS VEIN W/ LASER  11-05-2010  Left Greater Saphenous Vein  . GASTRIC BYPASS    . varicose vein strippin g       Social History   reports that he has never smoked. He has never used smokeless tobacco. He reports that he does not drink alcohol or use drugs.   Family History   His family history includes Hypertension in his mother.   Allergies No Known Allergies   Home Medications  Prior  to Admission medications   Medication Sig Start Date End Date Taking? Authorizing Provider  amLODipine (NORVASC) 10 MG tablet Take 10 mg by mouth daily.      [provider]  fish oil-omega-3 fatty acids 1000 MG capsule Take 1 g by mouth daily.      [provider]  losartan (COZAAR) 100 MG tablet Take 100 mg by mouth daily.      [provider]  Multiple Vitamin (MULTIVITAMIN) tablet Take 1 tablet by mouth daily.      [provider]  mupirocin (BACTROBAN) 2 % ointment Apply topically 3 (three) times daily.      [provider]  nebivolol (BYSTOLIC) 10 MG tablet Take 10 mg by mouth daily.      [provider]  spironolactone (ALDACTONE) 50 MG tablet Take 50 mg by mouth daily.      [provider]     Critical care time: 104 minutes     Jovita Kussmaul, AGACNP-BC [facility]  Pgr: [phone]  PCCM Pgr: [phone]    Attending MD note  Patient was independently seen and examined, treatment plan was discussed with the  Advance Practice Provider. I agree with the above note by NP Janyth Contes I have personally reviewed the clinical findings, labs, ECG, imaging studies and management of  this patient in detail. I have also reviewed the orders written for this patient which were under my direction. I agree with the documentation, as recorded by the Advance Practice Provider.   Briefly, RANALD ALESSIO is a 68 y.o. male with hx of HTN, OSA, obesity, signs of BLE venous stasis, sedentary lifestyle but mobile, no risk factors for PE< here with profound hypoxia and large B PE.   IN ED was found to be normotensive but hypoxic, working hard to breath, satting 80s on bipap at 100%.   CXR fairly normal considering hypoxia (mild perihilar infiltrate), but no obvious risk factors for PE.  Intubated in ED for hypoxia, never able to recover saturation above 70s, low 80s. Intubation was easy and not traumatic at all.    Some mild response to recruitement recruitment maneuvers. Gave additional paralytic to try to improve sat with improved synchrony. Mild improvement. Peak pressures had been high, but improved after this.      Attempted OG placement for decompression of stomach, thinking this may help his pulmonary compliance and reduce risk of ongoing aspiration.  OG attempts failed, NG attempts to both nares failed.  Blood noted on NG tube after nasal attempts.     Cardiac arrest 2/2 hypoxia, 5 min, given epi and bicarb 1 each with ROSC.  Immediately after given TPA bolus (50 pushed followed by 50 gtt over 1 hr) sat climbed from 0% to 91%.    Never recovered sat fully, remained with very high PaO2/FIO2 ratio. Remained on epi and levo gtt after code.    CTA following revealed extensive B PE with little blood flow.    Bedside echo prior to cardiac arrest: very poor windows.  Minimal apical view with normal size RV compared to LV, but unable to assess function.    Discussed with IR, unable to safely move patient for cath guided TPA.  Gave additional dose of TPA based on case studies (1/2 dose only 2/2 prior bleeding from OP).  Mild improvement in hemodynamics.    My cc time 75 minutes  This  patient is critically ill, requiring high complexity decision making for assessment and plan,  frequent evaluation, application of advanced monitoring and extensive interpretations of multiple databases.    Critical Care time devoted to patient care services described in this note is 75 minutes, not including time spent on procedures, teaching or supervising.    Charlotte SanesNicole Charolette Bultman, MD

## 2019-01-23 NOTE — Procedures (Signed)
Arterial Catheter Insertion Procedure Note Philip Wolfe [id number] [birth year]  Procedure: Insertion of Arterial Catheter  Indications: Blood pressure monitoring and Frequent blood sampling  Procedure Details Consent: Unable to obtain consent because of emergent medical necessity. Time Out: Verified patient identification, verified procedure, site/side was marked, verified correct patient position, special equipment/implants available, medications/allergies/relevent history reviewed, required imaging and test results available.  Performed  Maximum sterile technique was used including antiseptics, cap, gloves, gown, hand hygiene, mask and sheet. Skin prep: Chlorhexidine; local anesthetic administered 22 gauge catheter was inserted into left radial artery using the Seldinger technique. ULTRASOUND GUIDANCE USED: YES Evaluation Blood flow good; BP tracing good. Complications: No apparent complications.   Philip Wolfe 02/04/2019

## 2019-01-23 NOTE — Code Documentation (Signed)
Took pt off of vent, bagging in process

## 2019-01-23 NOTE — Code Documentation (Signed)
CPR started.

## 2019-01-23 NOTE — Procedures (Signed)
Central Venous Catheter Insertion Procedure Note JOSHUS ROGAN [id number] 08-21-1950  Procedure: Insertion of Central Venous Catheter Indications: Drug and/or fluid administration  Procedure Details Consent: Unable to obtain consent because of emergent medical necessity. Time Out: Verified patient identification, verified procedure, site/side was marked, verified correct patient position, special equipment/implants available, medications/allergies/relevent history reviewed, required imaging and test results available.  Performed  Maximum sterile technique was used including antiseptics, cap, gloves, gown, hand hygiene, mask and sheet. Skin prep: Chlorhexidine; local anesthetic administered A antimicrobial bonded/coated triple lumen catheter was placed in the right femoral vein due to emergent situation using the Seldinger technique.  Evaluation Blood flow good Complications: No apparent complications Patient did tolerate procedure well. Chest X-ray ordered to verify placement.  CXR: naCollier Bullock 01/19/2019, 10:10 PM

## 2019-01-23 NOTE — ED Provider Notes (Signed)
Patient care assumed at 1500. Patient here for evaluation of sudden onset shortness of breath, has experienced cough and mild shortness of breath for the last few weeks. He is requiring significant supplemental oxygen of high flow nasal cannula as well as non-rebreather. COVID swab has returned negative any was put on BiPAP for respiratory support. Patient feels significantly improved with BiPAP. Chest x-ray with possible infiltrate he was treated with antibiotics for possible pneumonia. He does have asymmetric lower extremity edema, concern for possible PE, he was treated with empiric heparin. He was also treated with Solu-Medrol for possible reactive airway component to his symptoms. Critical care consulted for admission for further treatment for hypoxic respiratory failure, concern for PE.   CRITICAL CARE Performed by: Quintella Reichert   Total critical care time: 65 minutes  Critical care time was exclusive of separately billable procedures and treating other patients.  Critical care was necessary to treat or prevent imminent or life-threatening deterioration.  Critical care was time spent personally by me on the following activities: development of treatment plan with patient and/or surrogate as well as nursing, discussions with consultants, evaluation of patient's response to treatment, examination of patient, obtaining history from patient or surrogate, ordering and performing treatments and interventions, ordering and review of laboratory studies, ordering and review of radiographic studies, pulse oximetry and re-evaluation of patient's condition.   Philip Wolfe was evaluated in Emergency Department on 02/02/2019 for the symptoms described in the history of present illness. He was evaluated in the context of the global COVID-19 pandemic, which necessitated consideration that the patient might be at risk for infection with the SARS-CoV-2 virus that causes COVID-19. Institutional protocols and  algorithms that pertain to the evaluation of patients at risk for COVID-19 are in a state of rapid change based on information released by regulatory bodies including the CDC and federal and state organizations. These policies and algorithms were followed during the patient's care in the ED.    Quintella Reichert, MD 02/14/2019 [phone]

## 2019-01-23 NOTE — ED Notes (Signed)
Copious amounts of bright red blood coming from mouth

## 2019-01-23 NOTE — ED Notes (Signed)
OG attempted with glidoscope, unsuccessful

## 2019-01-23 NOTE — ED Provider Notes (Signed)
[facility] EMERGENCY DEPARTMENT Provider Note   CSN: [id number] Arrival date & time: 02/08/2019  1440     History   Chief Complaint Chief Complaint  Patient presents with  . Shortness of Breath    HPI Philip Wolfe is a 68 y.o. male.     68 yo M with a cc of sob.  This been going on for the past couple weeks.  Acutely worsening over the past 2 or 3 hours.  Per EMS the patient's oxygen saturation was never higher than 60 even on nonrebreather.  Really tachypneic and cyanotic.  Level 5 caveat acuity of condition.  The history is provided by the patient.  Shortness of Breath Severity:  Severe Onset quality:  Gradual Duration:  2 weeks Timing:  Constant Progression:  Worsening Chronicity:  New Relieved by:  Nothing Worsened by:  Nothing Ineffective treatments:  None tried Associated symptoms: cough   Associated symptoms: no abdominal pain, no chest pain, no fever, no headaches, no rash and no vomiting     Past Medical History:  Diagnosis Date  . Arthritis   . Hypertension   . Sleep apnea   . Ulcer left medial ankle  . Venous stasis ulcer (HCC)     Patient Active Problem List   Diagnosis Date Noted  . Ankle ulcer (HCC) 07/31/2010  . Leg pain 07/31/2010  . Varicose veins of lower extremities with other complications 07/31/2010    Past Surgical History:  Procedure Laterality Date  . ENDOVENOUS ABLATION SAPHENOUS VEIN W/ LASER  11-05-2010  Left Greater Saphenous Vein  . GASTRIC BYPASS    . varicose vein strippin g          Home Medications    Prior to Admission medications   Medication Sig Start Date End Date Taking? Authorizing Provider  amLODipine (NORVASC) 10 MG tablet Take 10 mg by mouth daily.      [provider]  fish oil-omega-3 fatty acids 1000 MG capsule Take 1 g by mouth daily.      [provider]  losartan (COZAAR) 100 MG tablet Take 100 mg by mouth daily.      [provider]  Multiple Vitamin  (MULTIVITAMIN) tablet Take 1 tablet by mouth daily.      [provider]  mupirocin (BACTROBAN) 2 % ointment Apply topically 3 (three) times daily.      [provider]  nebivolol (BYSTOLIC) 10 MG tablet Take 10 mg by mouth daily.      [provider]  spironolactone (ALDACTONE) 50 MG tablet Take 50 mg by mouth daily.      [provider]    Family History Family History  Problem Relation Age of Onset  . Hypertension Mother     Social History Social History   Tobacco Use  . Smoking status: Never Smoker  . Smokeless tobacco: Never Used  Substance Use Topics  . Alcohol use: No  . Drug use: No     Allergies   Patient has no known allergies.   Review of Systems Review of Systems  Unable to perform ROS: Severe respiratory distress  Constitutional: Negative for chills and fever.  HENT: Negative for congestion and facial swelling.   Eyes: Negative for discharge and visual disturbance.  Respiratory: Positive for cough and shortness of breath.   Cardiovascular: Negative for chest pain and palpitations.  Gastrointestinal: Negative for abdominal pain, diarrhea and vomiting.  Musculoskeletal: Negative for arthralgias and myalgias.  Skin: Negative for  color change and rash.  Neurological: Negative for tremors, syncope and headaches.  Psychiatric/Behavioral: Negative for confusion and dysphoric mood.     Physical Exam Updated Vital Signs BP (!) 146/80   Pulse 92   Temp (!) 97.4 F (36.3 C) (Oral)   Resp (!) 32   Ht 5\' 9"  (1.753 m)   Wt (!) 137 kg   SpO2 (!) 81%   BMI 44.60 kg/m   Physical Exam Vitals signs and nursing note reviewed.  Constitutional:      Appearance: He is well-developed. He is morbidly obese.  HENT:     Head: Normocephalic and atraumatic.  Eyes:     Pupils: Pupils are equal, round, and reactive to light.  Neck:     Musculoskeletal: Normal range of motion and neck supple.     Vascular: No JVD.  Cardiovascular:      Rate and Rhythm: Normal rate and regular rhythm.     Heart sounds: No murmur. No friction rub. No gallop.   Pulmonary:     Effort: Tachypnea and respiratory distress present.     Breath sounds: Wheezing (faint end expiratory) present.  Abdominal:     General: There is no distension.     Tenderness: There is no guarding or rebound.  Musculoskeletal: Normal range of motion.  Skin:    Coloration: Skin is not pale.     Findings: No rash.  Neurological:     Mental Status: He is alert and oriented to person, place, and time.  Psychiatric:        Behavior: Behavior normal.      ED Treatments / Results  Labs (all labs ordered are listed, but only abnormal results are displayed) Labs Reviewed  CBC WITH DIFFERENTIAL/PLATELET - Abnormal; Notable for the following components:      Result Value   WBC 15.6 (*)    Neutro Abs 10.6 (*)    Monocytes Absolute 1.5 (*)    Abs Immature Granulocytes 0.11 (*)    All other components within normal limits  I-STAT CREATININE, ED - Abnormal; Notable for the following components:   Creatinine, Ser 1.30 (*)    All other components within normal limits  POCT I-STAT 7, (LYTES, BLD GAS, ICA,H+H) - Abnormal; Notable for the following components:   pH, Arterial 7.288 (*)    pO2, Arterial 43.0 (*)    Bicarbonate 17.8 (*)    TCO2 19 (*)    Acid-base deficit 8.0 (*)    All other components within normal limits  CULTURE, BLOOD (ROUTINE X 2)  CULTURE, BLOOD (ROUTINE X 2)  RESPIRATORY PANEL BY RT PCR (FLU A&B, COVID)  BLOOD GAS, ARTERIAL  COMPREHENSIVE METABOLIC PANEL  PROTIME-INR  LACTIC ACID, PLASMA  LACTIC ACID, PLASMA  BRAIN NATRIURETIC PEPTIDE  POC SARS CORONAVIRUS 2 AG -  ED  TROPONIN I (HIGH SENSITIVITY)    EKG EKG Interpretation  Date/Time:  Monday January 23 2019 14:47:23 EST Ventricular Rate:  99 PR Interval:    QRS Duration: 108 QT Interval:  339 QTC Calculation: 435 R Axis:   -94 Text Interpretation: Atrial fibrillation Paired  ventricular premature complexes Anterolateral infarct, old TECHNICALLY DIFFICULT No significant change since last tracing Confirmed by Deno Etienne [phone]) on 02/08/2019 2:55:20 PM   Radiology No results found.  Procedures Procedures (including critical care time)  Medications Ordered in ED Medications - No data to display   Initial Impression / Assessment and Plan / ED Course  I have reviewed the triage vital signs and  the nursing notes.  Pertinent labs & imaging results that were available during my care of the patient were reviewed by me and considered in my medical decision making (see chart for details).        68 yo M with a chief complaints of acute onset shortness of breath.  Has been off and on for the past couple weeks with severely worsening over the past few hours.  Patient is very tachypneic but is able to talk with me in short sentences.  Oxygen saturation is with a very poor Plath.  Had some improvement with high flow nasal cannula and nonrebreather in combination.  Initial rapid test was negative for the novel coronavirus.  His story is still somewhat concerning for it as the patient has had significant shortness of breath and is able to talk and mentate even with a low oxygen saturation.  His blood gas was reportedly pulsatile though the PO2 is significantly low on nonrebreather and high flow nasal cannula.  I reassessed the patient who is feeling much better, the pulse ox is still not reading well though is more routinely saying that it is in the low 90s.  I discussed with him my concern that he may need to be intubated to apply the appropriate amount of oxygen.  As he is feeling better he would prefer to hold off at this time.  I discussed with the respiratory therapist about placing him on BiPAP though there is some reluctance with the initial concern about his symptoms and there are no negative pressure rooms available for him to go.  Signed out to Dr. Madilyn Hookees, please see their  note for further details of care.   CRITICAL CARE Performed by: Rae Roamaniel Patrick Keir Foland   Total critical care time: 35 minutes  Critical care time was exclusive of separately billable procedures and treating other patients.  Critical care was necessary to treat or prevent imminent or life-threatening deterioration.  Critical care was time spent personally by me on the following activities: development of treatment plan with patient and/or surrogate as well as nursing, discussions with consultants, evaluation of patient's response to treatment, examination of patient, obtaining history from patient or surrogate, ordering and performing treatments and interventions, ordering and review of laboratory studies, ordering and review of radiographic studies, pulse oximetry and re-evaluation of patient's condition.  The patients results and plan were reviewed and discussed.   Any x-rays performed were independently reviewed by myself.   Differential diagnosis were considered with the presenting HPI.  Medications - No data to display  Vitals:   01/24/2019 1444 02/02/2019 1445 01/24/2019 1508  BP:   (!) 146/80  Pulse:   92  Resp:  (!) 29 (!) 32  Temp:   (!) 97.4 F (36.3 C)  TempSrc:   Oral  SpO2:  (!) 75% (!) 81%  Weight: (!) 137 kg    Height: 5\' 9"  (1.753 m)      Final diagnoses:  Acute respiratory failure with hypoxia [facility](HCC)       Final Clinical Impressions(s) / ED Diagnoses   Final diagnoses:  Acute respiratory failure with hypoxia [facility](HCC)    ED Discharge Orders    None       Melene PlanFloyd, Ismahan Lippman, DO 01/31/2019 1530

## 2019-01-23 NOTE — Code Documentation (Signed)
Pt placed back on vent

## 2019-01-23 NOTE — ED Notes (Signed)
Pulse check.   

## 2019-01-23 NOTE — ED Notes (Signed)
Pulse back

## 2019-01-23 NOTE — Procedures (Addendum)
Intubation Procedure Note RYKEN PASCHAL [id number] Apr 19, 1950  Procedure: Intubation Indications: Respiratory insufficiency  Procedure Details Consent: Risks of procedure as well as the alternatives and risks of each were explained to the (patient/caregiver).  Consent for procedure obtained. Time Out: Verified patient identification, verified procedure, site/side was marked, verified correct patient position, special equipment/implants available, medications/allergies/relevent history reviewed, required imaging and test results available.  Performed  Maximum sterile technique was used including gloves, gown, hand hygiene and mask.  3 Easy intubation, glidescope used, passed ET tube immediately, no trauma.  Hypoxic prior to intubation, on 100% on Bipap, sat 83%.  Sat dropped eventually to 50%, were able to raise sat up to 70s.    Evaluation Hemodynamic Status: BP stable throughout; O2 sats: desaturated persistently. See admission note.  Patient's Current Condition: unstable Complications: Complications of  hypoxia Patient did not tolerate procedure well. Chest X-ray ordered to verify placement.  CXR: pending.   Collier Bullock 01/28/2019

## 2019-01-23 NOTE — ED Triage Notes (Signed)
Pt arrives via EMS from home with SOB that began today. Denies recent fever, some cough. Denies recent weight gain. Pt awake, alert, oriented, able to speak in short sentences only. 15L 79%. Full code.

## 2019-01-23 NOTE — Code Documentation (Signed)
OG attempted, unsuccessful

## 2019-01-23 NOTE — Code Documentation (Signed)
Lost pulse. 

## 2019-01-23 NOTE — Progress Notes (Signed)
ANTICOAGULATION CONSULT NOTE - Initial Consult  Pharmacy Consult for Heparin  Indication: pulmonary embolus  No Known Allergies  Patient Measurements: Height: (P) 5\' 9"  (175.3 cm) Weight: (!) 302 lb (137 kg) IBW/kg (Calculated) : (P) 70.7 Heparin Dosing Weight: 103 kg  Vital Signs: Temp: 97.4 F (36.3 C) (12/07 1508) Temp Source: Oral (12/07 1508) BP: 87/53 (12/07 2130) Pulse Rate: 74 (12/07 2130)  Labs: Recent Labs    01/18/2019 1500  01/27/2019 1504 01/21/2019 1505 02/14/2019 1507 01/19/2019 1657 01/22/2019 2051 02/02/2019 2157  HGB  --    < > 16.6 16.7  --   --  15.6 15.6  HCT  --    < > 52.0 49.0  --   --  46.0 46.0  PLT  --   --  242  --   --   --   --   --   LABPROT  --   --  14.7  --   --   --   --   --   INR  --   --  1.2  --   --   --   --   --   CREATININE  --   --  1.51*  --  1.30*  --   --   --   TROPONINIHS 35*  --   --   --   --  315*  --   --    < > = values in this interval not displayed.    Estimated Creatinine Clearance: 75.8 mL/min (A) (by C-G formula based on SCr of 1.3 mg/dL (H)).   Medical History: Past Medical History:  Diagnosis Date  . Arthritis   . Hypertension   . Sleep apnea   . Ulcer left medial ankle  . Venous stasis ulcer (HCC)     Medications:  Scheduled:  . sodium chloride   Intravenous Once  . EPINEPHrine      . EPINEPHrine      . fentaNYL      . fentaNYL      . heparin  7,500 Units Intravenous Once  . midazolam      . midazolam        Assessment: Patient is a 76 yom that presented to the ED with complaints of SOB. COVID test was negative and is currently on BiPAP. The patient had an elevated D-Dimer and was evaluated for a PE. Patient decompensated in the ED and was unable to obtain CT scan. There was an extremely high suspension for PE during this time. During this time the bolus of heparin was given and the patient went unresponsive shortly after starting the bolus. With this it was decided to give the patient Alteplase during  the code. The patient was given Alteplase 50mg  IV bolus over 2 min. The patient obtained ROSC from this. The remaining 50 mg of Alteplase was then infused over an hour.  Heparin was resumed after after a period following the alteplase. Then D/cd as to wait until the patient's aPTT is below 80 seconds.   - Patient is experiencing bleeding from his mouth. It is not in the lungs as when the ET tube is suctioned it is not present. The patient is has suctioned out ~ 500 ml at this point.  Goal of Therapy:  Heparin level 0.3-0.5 units/ml (first 24 hours) then  Heparin level 0.3-0.7 units/ml Monitor platelets by anticoagulation protocol: Yes   Plan:  - aPTT ~ 30 min after alteplase completed ordered with patient's  critical condition likely will be a prolonged time. . - If aPTT is > 80 seconds will repeat aPTT in 2 hours  - Pharmacy will monitor patients aPTT and start heparin once below 80 seconds. - Will then dose heparin based on Heparin levels  - With no bolus and the patient's weight would recommend being more aggressive with the infusion after the aPTT is < 80 seconds  - Monitor patient for worsening s/s of bleeding and CBC daily while on heparin   Joaquim Lai PharmD. BCPS  01/19/2019,10:00 PM

## 2019-01-23 NOTE — ED Notes (Signed)
Pt placed back on vent

## 2019-01-24 ENCOUNTER — Inpatient Hospital Stay (HOSPITAL_COMMUNITY): Payer: BC Managed Care – PPO

## 2019-01-24 ENCOUNTER — Encounter (HOSPITAL_COMMUNITY): Payer: Self-pay | Admitting: Interventional Radiology

## 2019-01-24 ENCOUNTER — Inpatient Hospital Stay (HOSPITAL_COMMUNITY): Payer: BC Managed Care – PPO | Admitting: Certified Registered Nurse Anesthetist

## 2019-01-24 ENCOUNTER — Encounter (HOSPITAL_COMMUNITY): Payer: BC Managed Care – PPO

## 2019-01-24 ENCOUNTER — Inpatient Hospital Stay (HOSPITAL_COMMUNITY): Admission: EM | Disposition: E | Payer: Self-pay | Source: Home / Self Care | Attending: Internal Medicine

## 2019-01-24 DIAGNOSIS — J9601 Acute respiratory failure with hypoxia: Secondary | ICD-10-CM | POA: Diagnosis not present

## 2019-01-24 DIAGNOSIS — I2699 Other pulmonary embolism without acute cor pulmonale: Secondary | ICD-10-CM

## 2019-01-24 DIAGNOSIS — R570 Cardiogenic shock: Secondary | ICD-10-CM

## 2019-01-24 HISTORY — PX: IR ANGIOGRAM SELECTIVE EACH ADDITIONAL VESSEL: IMG667

## 2019-01-24 HISTORY — PX: IR INFUSION THROMBOL ARTERIAL INITIAL (MS): IMG5376

## 2019-01-24 HISTORY — PX: CANNULATION FOR ECMO (EXTRACORPOREAL MEMBRANE OXYGENATION): SHX6796

## 2019-01-24 HISTORY — PX: IR ANGIOGRAM PULMONARY BILATERAL SELECTIVE: IMG664

## 2019-01-24 HISTORY — PX: TEE WITHOUT CARDIOVERSION: SHX5443

## 2019-01-24 LAB — URINALYSIS, ROUTINE W REFLEX MICROSCOPIC
Bilirubin Urine: NEGATIVE
Glucose, UA: NEGATIVE mg/dL
Ketones, ur: NEGATIVE mg/dL
Leukocytes,Ua: NEGATIVE
Nitrite: NEGATIVE
Protein, ur: 100 mg/dL — AB
Specific Gravity, Urine: >1.046 — ABNORMAL HIGH (ref 1.005–1.030)
pH: 5 (ref 5.0–8.0)

## 2019-01-24 LAB — GLUCOSE, CAPILLARY
Glucose-Capillary: 280 mg/dL — ABNORMAL HIGH (ref 70–99)
Glucose-Capillary: 334 mg/dL — ABNORMAL HIGH (ref 70–99)
Glucose-Capillary: 419 mg/dL — ABNORMAL HIGH (ref 70–99)
Glucose-Capillary: 436 mg/dL — ABNORMAL HIGH (ref 70–99)
Glucose-Capillary: 447 mg/dL — ABNORMAL HIGH (ref 70–99)
Glucose-Capillary: 418 mg/dL — ABNORMAL HIGH (ref 70–99)
Glucose-Capillary: 402 mg/dL — ABNORMAL HIGH (ref 70–99)
Glucose-Capillary: 224 mg/dL — ABNORMAL HIGH (ref 70–99)
Glucose-Capillary: 222 mg/dL — ABNORMAL HIGH (ref 70–99)

## 2019-01-24 LAB — POCT I-STAT, CHEM 8
BUN: 49 mg/dL — ABNORMAL HIGH (ref 8–23)
BUN: 56 mg/dL — ABNORMAL HIGH (ref 8–23)
Calcium, Ion: 1.04 mmol/L — ABNORMAL LOW (ref 1.15–1.40)
Calcium, Ion: 0.99 mmol/L — ABNORMAL LOW (ref 1.15–1.40)
Chloride: 105 mmol/L (ref 98–111)
Chloride: 101 mmol/L (ref 98–111)
Creatinine, Ser: 2.2 mg/dL — ABNORMAL HIGH (ref 0.61–1.24)
Creatinine, Ser: 2.1 mg/dL — ABNORMAL HIGH (ref 0.61–1.24)
Glucose, Bld: 424 mg/dL — ABNORMAL HIGH (ref 70–99)
Glucose, Bld: 360 mg/dL — ABNORMAL HIGH (ref 70–99)
HCT: 34 % — ABNORMAL LOW (ref 39.0–52.0)
HCT: 24 % — ABNORMAL LOW (ref 39.0–52.0)
Hemoglobin: 11.6 g/dL — ABNORMAL LOW (ref 13.0–17.0)
Hemoglobin: 8.2 g/dL — ABNORMAL LOW (ref 13.0–17.0)
Potassium: 3.2 mmol/L — ABNORMAL LOW (ref 3.5–5.1)
Potassium: 3 mmol/L — ABNORMAL LOW (ref 3.5–5.1)
Sodium: 144 mmol/L (ref 135–145)
Sodium: 145 mmol/L (ref 135–145)
TCO2: 23 mmol/L (ref 22–32)
TCO2: 26 mmol/L (ref 22–32)

## 2019-01-24 LAB — HEPARIN LEVEL (UNFRACTIONATED)
Heparin Unfractionated: 0.64 IU/mL (ref 0.30–0.70)
Heparin Unfractionated: 0.34 IU/mL (ref 0.30–0.70)

## 2019-01-24 LAB — MAGNESIUM
Magnesium: 2 mg/dL (ref 1.7–2.4)
Magnesium: 2.1 mg/dL (ref 1.7–2.4)

## 2019-01-24 LAB — POCT I-STAT 7, (LYTES, BLD GAS, ICA,H+H)
Acid-Base Excess: 5 mmol/L — ABNORMAL HIGH (ref 0.0–2.0)
Acid-base deficit: 1 mmol/L (ref 0.0–2.0)
Acid-base deficit: 4 mmol/L — ABNORMAL HIGH (ref 0.0–2.0)
Acid-base deficit: 5 mmol/L — ABNORMAL HIGH (ref 0.0–2.0)
Acid-base deficit: 6 mmol/L — ABNORMAL HIGH (ref 0.0–2.0)
Acid-base deficit: 9 mmol/L — ABNORMAL HIGH (ref 0.0–2.0)
Bicarbonate: 19 mmol/L — ABNORMAL LOW (ref 20.0–28.0)
Bicarbonate: 22.4 mmol/L (ref 20.0–28.0)
Bicarbonate: 22.7 mmol/L (ref 20.0–28.0)
Bicarbonate: 23 mmol/L (ref 20.0–28.0)
Bicarbonate: 26.3 mmol/L (ref 20.0–28.0)
Bicarbonate: 29.5 mmol/L — ABNORMAL HIGH (ref 20.0–28.0)
Calcium, Ion: 0.97 mmol/L — ABNORMAL LOW (ref 1.15–1.40)
Calcium, Ion: 0.99 mmol/L — ABNORMAL LOW (ref 1.15–1.40)
Calcium, Ion: 1.01 mmol/L — ABNORMAL LOW (ref 1.15–1.40)
Calcium, Ion: 1.05 mmol/L — ABNORMAL LOW (ref 1.15–1.40)
Calcium, Ion: 1.07 mmol/L — ABNORMAL LOW (ref 1.15–1.40)
Calcium, Ion: 1.09 mmol/L — ABNORMAL LOW (ref 1.15–1.40)
HCT: 23 % — ABNORMAL LOW (ref 39.0–52.0)
HCT: 25 % — ABNORMAL LOW (ref 39.0–52.0)
HCT: 33 % — ABNORMAL LOW (ref 39.0–52.0)
HCT: 39 % (ref 39.0–52.0)
HCT: 43 % (ref 39.0–52.0)
HCT: 43 % (ref 39.0–52.0)
Hemoglobin: 11.2 g/dL — ABNORMAL LOW (ref 13.0–17.0)
Hemoglobin: 13.3 g/dL (ref 13.0–17.0)
Hemoglobin: 14.6 g/dL (ref 13.0–17.0)
Hemoglobin: 14.6 g/dL (ref 13.0–17.0)
Hemoglobin: 7.8 g/dL — ABNORMAL LOW (ref 13.0–17.0)
Hemoglobin: 8.5 g/dL — ABNORMAL LOW (ref 13.0–17.0)
O2 Saturation: 100 %
O2 Saturation: 100 %
O2 Saturation: 100 %
O2 Saturation: 100 %
O2 Saturation: 100 %
O2 Saturation: 97 %
Patient temperature: 100.4
Patient temperature: 37
Patient temperature: 37.5
Patient temperature: 37.5
Patient temperature: 37.6
Potassium: 2.9 mmol/L — ABNORMAL LOW (ref 3.5–5.1)
Potassium: 2.9 mmol/L — ABNORMAL LOW (ref 3.5–5.1)
Potassium: 3 mmol/L — ABNORMAL LOW (ref 3.5–5.1)
Potassium: 3.6 mmol/L (ref 3.5–5.1)
Potassium: 3.8 mmol/L (ref 3.5–5.1)
Potassium: 5.5 mmol/L — ABNORMAL HIGH (ref 3.5–5.1)
Sodium: 138 mmol/L (ref 135–145)
Sodium: 141 mmol/L (ref 135–145)
Sodium: 142 mmol/L (ref 135–145)
Sodium: 143 mmol/L (ref 135–145)
Sodium: 145 mmol/L (ref 135–145)
Sodium: 147 mmol/L — ABNORMAL HIGH (ref 135–145)
TCO2: 20 mmol/L — ABNORMAL LOW (ref 22–32)
TCO2: 24 mmol/L (ref 22–32)
TCO2: 24 mmol/L (ref 22–32)
TCO2: 24 mmol/L (ref 22–32)
TCO2: 28 mmol/L (ref 22–32)
TCO2: 31 mmol/L (ref 22–32)
pCO2 arterial: 45.4 mmHg (ref 32.0–48.0)
pCO2 arterial: 48.8 mmHg — ABNORMAL HIGH (ref 32.0–48.0)
pCO2 arterial: 48.9 mmHg — ABNORMAL HIGH (ref 32.0–48.0)
pCO2 arterial: 52.4 mmHg — ABNORMAL HIGH (ref 32.0–48.0)
pCO2 arterial: 57.9 mmHg — ABNORMAL HIGH (ref 32.0–48.0)
pCO2 arterial: 63.1 mmHg — ABNORMAL HIGH (ref 32.0–48.0)
pH, Arterial: 7.2 — ABNORMAL LOW (ref 7.350–7.450)
pH, Arterial: 7.201 — ABNORMAL LOW (ref 7.350–7.450)
pH, Arterial: 7.23 — ABNORMAL LOW (ref 7.350–7.450)
pH, Arterial: 7.248 — ABNORMAL LOW (ref 7.350–7.450)
pH, Arterial: 7.282 — ABNORMAL LOW (ref 7.350–7.450)
pH, Arterial: 7.421 (ref 7.350–7.450)
pO2, Arterial: 116 mmHg — ABNORMAL HIGH (ref 83.0–108.0)
pO2, Arterial: 229 mmHg — ABNORMAL HIGH (ref 83.0–108.0)
pO2, Arterial: 243 mmHg — ABNORMAL HIGH (ref 83.0–108.0)
pO2, Arterial: 331 mmHg — ABNORMAL HIGH (ref 83.0–108.0)
pO2, Arterial: 352 mmHg — ABNORMAL HIGH (ref 83.0–108.0)
pO2, Arterial: 453 mmHg — ABNORMAL HIGH (ref 83.0–108.0)

## 2019-01-24 LAB — BASIC METABOLIC PANEL
Anion gap: 21 — ABNORMAL HIGH (ref 5–15)
Anion gap: 12 (ref 5–15)
BUN: 54 mg/dL — ABNORMAL HIGH (ref 8–23)
BUN: 55 mg/dL — ABNORMAL HIGH (ref 8–23)
CO2: 19 mmol/L — ABNORMAL LOW (ref 22–32)
CO2: 25 mmol/L (ref 22–32)
Calcium: 7.6 mg/dL — ABNORMAL LOW (ref 8.9–10.3)
Calcium: 8 mg/dL — ABNORMAL LOW (ref 8.9–10.3)
Chloride: 104 mmol/L (ref 98–111)
Chloride: 105 mmol/L (ref 98–111)
Creatinine, Ser: 2.33 mg/dL — ABNORMAL HIGH (ref 0.61–1.24)
Creatinine, Ser: 2.32 mg/dL — ABNORMAL HIGH (ref 0.61–1.24)
GFR calc Af Amer: 32 mL/min — ABNORMAL LOW (ref >60)
GFR calc Af Amer: 32 mL/min — ABNORMAL LOW (ref >60)
GFR calc non Af Amer: 28 mL/min — ABNORMAL LOW (ref >60)
GFR calc non Af Amer: 28 mL/min — ABNORMAL LOW (ref >60)
Glucose, Bld: 390 mg/dL — ABNORMAL HIGH (ref 70–99)
Glucose, Bld: 280 mg/dL — ABNORMAL HIGH (ref 70–99)
Potassium: 3.4 mmol/L — ABNORMAL LOW (ref 3.5–5.1)
Potassium: 2.8 mmol/L — ABNORMAL LOW (ref 3.5–5.1)
Sodium: 144 mmol/L (ref 135–145)
Sodium: 142 mmol/L (ref 135–145)

## 2019-01-24 LAB — CBC
HCT: 44.2 % (ref 39.0–52.0)
HCT: 25.5 % — ABNORMAL LOW (ref 39.0–52.0)
HCT: 23.7 % — ABNORMAL LOW (ref 39.0–52.0)
Hemoglobin: 13.9 g/dL (ref 13.0–17.0)
Hemoglobin: 8.5 g/dL — ABNORMAL LOW (ref 13.0–17.0)
Hemoglobin: 8.1 g/dL — ABNORMAL LOW (ref 13.0–17.0)
MCH: 29.6 pg (ref 26.0–34.0)
MCH: 30.6 pg (ref 26.0–34.0)
MCH: 30.8 pg (ref 26.0–34.0)
MCHC: 31.4 g/dL (ref 30.0–36.0)
MCHC: 33.3 g/dL (ref 30.0–36.0)
MCHC: 34.2 g/dL (ref 30.0–36.0)
MCV: 94 fL (ref 80.0–100.0)
MCV: 91.7 fL (ref 80.0–100.0)
MCV: 90.1 fL (ref 80.0–100.0)
Platelets: 246 10*3/uL (ref 150–400)
Platelets: 152 10*3/uL (ref 150–400)
Platelets: 122 10*3/uL — ABNORMAL LOW (ref 150–400)
RBC: 4.7 MIL/uL (ref 4.22–5.81)
RBC: 2.78 MIL/uL — ABNORMAL LOW (ref 4.22–5.81)
RBC: 2.63 MIL/uL — ABNORMAL LOW (ref 4.22–5.81)
RDW: 13.8 % (ref 11.5–15.5)
RDW: 13.5 % (ref 11.5–15.5)
RDW: 13.4 % (ref 11.5–15.5)
WBC: 26.5 10*3/uL — ABNORMAL HIGH (ref 4.0–10.5)
WBC: 21.7 10*3/uL — ABNORMAL HIGH (ref 4.0–10.5)
WBC: 23.1 10*3/uL — ABNORMAL HIGH (ref 4.0–10.5)
nRBC: 0 % (ref 0.0–0.2)
nRBC: 0.2 % (ref 0.0–0.2)
nRBC: 0.2 % (ref 0.0–0.2)

## 2019-01-24 LAB — ECHO INTRAOPERATIVE TEE
Height: 69.016 in
Weight: 5167.58 oz

## 2019-01-24 LAB — TROPONIN I (HIGH SENSITIVITY): Troponin I (High Sensitivity): 884 ng/L (ref <18)

## 2019-01-24 LAB — PROTIME-INR
INR: 1.5 — ABNORMAL HIGH (ref 0.8–1.2)
INR: 1.6 — ABNORMAL HIGH (ref 0.8–1.2)
INR: 2 — ABNORMAL HIGH (ref 0.8–1.2)
Prothrombin Time: 17.6 seconds — ABNORMAL HIGH (ref 11.4–15.2)
Prothrombin Time: 19.2 seconds — ABNORMAL HIGH (ref 11.4–15.2)
Prothrombin Time: 22.9 seconds — ABNORMAL HIGH (ref 11.4–15.2)

## 2019-01-24 LAB — ECHOCARDIOGRAM LIMITED
Height: 69 in
Weight: 5167.58 oz

## 2019-01-24 LAB — APTT
aPTT: 137 seconds — ABNORMAL HIGH (ref 24–36)
aPTT: 138 seconds — ABNORMAL HIGH (ref 24–36)
aPTT: 50 seconds — ABNORMAL HIGH (ref 24–36)

## 2019-01-24 LAB — HEMOGLOBIN A1C
Hgb A1c MFr Bld: 6.3 % — ABNORMAL HIGH (ref 4.8–5.6)
Mean Plasma Glucose: 134.11 mg/dL

## 2019-01-24 LAB — PREPARE RBC (CROSSMATCH)

## 2019-01-24 LAB — PHOSPHORUS: Phosphorus: 9.1 mg/dL — ABNORMAL HIGH (ref 2.5–4.6)

## 2019-01-24 LAB — TRIGLYCERIDES: Triglycerides: 224 mg/dL — ABNORMAL HIGH (ref <150)

## 2019-01-24 LAB — HIV ANTIBODY (ROUTINE TESTING W REFLEX): HIV Screen 4th Generation wRfx: NONREACTIVE

## 2019-01-24 LAB — FIBRINOGEN
Fibrinogen: 117 mg/dL — ABNORMAL LOW (ref 210–475)
Fibrinogen: 112 mg/dL — ABNORMAL LOW (ref 210–475)

## 2019-01-24 LAB — LACTIC ACID, PLASMA
Lactic Acid, Venous: 3.2 mmol/L (ref 0.5–1.9)
Lactic Acid, Venous: 5 mmol/L (ref 0.5–1.9)
Lactic Acid, Venous: 4.3 mmol/L (ref 0.5–1.9)

## 2019-01-24 LAB — MRSA PCR SCREENING: MRSA by PCR: NEGATIVE

## 2019-01-24 LAB — PROCALCITONIN
Procalcitonin: 0.61 ng/mL
Procalcitonin: 1.12 ng/mL

## 2019-01-24 SURGERY — CANNULATION FOR ECMO (EXTRACORPOREAL MEMBRANE OXYGENATION)
Anesthesia: General

## 2019-01-24 MED ORDER — SODIUM CHLORIDE 0.9% FLUSH: 3.0000 mL | INTRAVENOUS | Status: DC | PRN

## 2019-01-24 MED ORDER — DEXTROSE 50 % IV SOLN: 0.0000 mL | INTRAVENOUS | Status: DC | PRN

## 2019-01-24 MED ORDER — NOREPINEPHRINE BITARTRATE 1 MG/ML IV SOLN
INTRAVENOUS | Status: AC | PRN
  Administered 2019-01-23: 5 ug/kg/min via INTRAVENOUS

## 2019-01-24 MED ORDER — SODIUM CHLORIDE 0.9 % IV SOLN
INTRAVENOUS | Status: DC
  Filled 2019-01-24: qty 30

## 2019-01-24 MED ORDER — PHENYLEPHRINE HCL-NACL 20-0.9 MG/250ML-% IV SOLN
30.0000 ug/min | INTRAVENOUS | Status: DC
  Filled 2019-01-24: qty 250

## 2019-01-24 MED ORDER — 0.9 % SODIUM CHLORIDE (POUR BTL) OPTIME
TOPICAL | Status: DC | PRN
  Administered 2019-01-24: 2000 mL

## 2019-01-24 MED ORDER — METOPROLOL TARTRATE 5 MG/5ML IV SOLN: 2.5000 mg | INTRAVENOUS | Status: DC | PRN

## 2019-01-24 MED ORDER — SODIUM BICARBONATE 8.4 % IV SOLN
INTRAVENOUS | Status: AC
  Administered 2019-01-24: 100 meq via INTRAVENOUS
  Filled 2019-01-24: qty 100

## 2019-01-24 MED ORDER — MIDAZOLAM HCL (PF) 10 MG/2ML IJ SOLN
INTRAMUSCULAR | Status: AC
  Filled 2019-01-24: qty 2

## 2019-01-24 MED ORDER — PANTOPRAZOLE SODIUM 40 MG PO TBEC: 40.0000 mg | DELAYED_RELEASE_TABLET | Freq: Every day | ORAL | Status: DC

## 2019-01-24 MED ORDER — SODIUM CHLORIDE 0.9% FLUSH
3.0000 mL | Freq: Two times a day (BID) | INTRAVENOUS | Status: DC
  Administered 2019-01-26: 3 mL via INTRAVENOUS

## 2019-01-24 MED ORDER — ASPIRIN EC 81 MG PO TBEC: 81.0000 mg | DELAYED_RELEASE_TABLET | Freq: Every day | ORAL | Status: DC

## 2019-01-24 MED ORDER — CHLORHEXIDINE GLUCONATE CLOTH 2 % EX PADS
6.0000 | MEDICATED_PAD | Freq: Every day | CUTANEOUS | Status: DC
  Administered 2019-01-24 – 2019-01-27 (×4): 6 via TOPICAL

## 2019-01-24 MED ORDER — MIDAZOLAM HCL 2 MG/2ML IJ SOLN
2.0000 mg | INTRAMUSCULAR | Status: DC | PRN
  Administered 2019-01-24 – 2019-02-02 (×4): 2 mg via INTRAVENOUS
  Filled 2019-01-24 (×3): qty 2

## 2019-01-24 MED ORDER — CHLORHEXIDINE GLUCONATE 0.12 % MT SOLN
15.0000 mL | OROMUCOSAL | Status: AC
  Administered 2019-01-24: 15 mL via OROMUCOSAL
  Filled 2019-01-24: qty 15

## 2019-01-24 MED ORDER — ACETAMINOPHEN 160 MG/5ML PO SOLN
650.0000 mg | Freq: Once | ORAL | Status: AC
  Filled 2019-01-24: qty 20.3

## 2019-01-24 MED ORDER — DEXMEDETOMIDINE HCL IN NACL 400 MCG/100ML IV SOLN
0.4000 ug/kg/h | INTRAVENOUS | Status: DC
  Administered 2019-01-25 (×7): 0.8 ug/kg/h via INTRAVENOUS
  Administered 2019-01-26: 1 ug/kg/h via INTRAVENOUS
  Administered 2019-01-26: 1.2 ug/kg/h via INTRAVENOUS
  Administered 2019-01-26: 0.8 ug/kg/h via INTRAVENOUS
  Filled 2019-01-24: qty 100
  Filled 2019-01-24: qty 200
  Filled 2019-01-24 (×5): qty 100
  Filled 2019-01-24: qty 200
  Filled 2019-01-24 (×3): qty 100
  Filled 2019-01-24: qty 200
  Filled 2019-01-24: qty 100

## 2019-01-24 MED ORDER — FENTANYL CITRATE (PF) 250 MCG/5ML IJ SOLN
INTRAMUSCULAR | Status: AC
  Filled 2019-01-24: qty 10

## 2019-01-24 MED ORDER — VANCOMYCIN HCL IN DEXTROSE 1-5 GM/200ML-% IV SOLN
1000.0000 mg | INTRAVENOUS | Status: AC
  Administered 2019-01-25 – 2019-01-26 (×2): 1000 mg via INTRAVENOUS
  Filled 2019-01-24 (×2): qty 200

## 2019-01-24 MED ORDER — STERILE WATER FOR INJECTION IV SOLN
INTRAVENOUS | Status: DC
  Administered 2019-01-24: 01:00:00 via INTRAVENOUS
  Administered 2019-01-24: 100 mL via INTRAVENOUS
  Filled 2019-01-24 (×3): qty 850

## 2019-01-24 MED ORDER — HEPARIN (PORCINE) 25000 UT/250ML-% IV SOLN
1600.0000 [IU]/h | INTRAVENOUS | Status: DC
  Administered 2019-01-24: 1600 [IU]/h via INTRAVENOUS
  Filled 2019-01-24: qty 250

## 2019-01-24 MED ORDER — DEXTROSE-NACL 5-0.45 % IV SOLN: INTRAVENOUS | Status: DC

## 2019-01-24 MED ORDER — "THROMBI-PAD 3""X3"" EX PADS"
1.0000 | MEDICATED_PAD | Freq: Once | CUTANEOUS | Status: AC
  Administered 2019-01-24: 1 via TOPICAL
  Filled 2019-01-24: qty 1

## 2019-01-24 MED ORDER — LACTATED RINGERS IV SOLN
INTRAVENOUS | Status: DC | PRN
  Administered 2019-01-24: 13:00:00 via INTRAVENOUS

## 2019-01-24 MED ORDER — VASOPRESSIN 20 UNIT/ML IV SOLN
0.0100 [IU]/min | INTRAVENOUS | Status: DC
  Administered 2019-01-24 – 2019-01-25 (×3): 0.04 [IU]/min via INTRAVENOUS
  Administered 2019-01-26: 20:00:00 0.03 [IU]/min via INTRAVENOUS
  Administered 2019-01-27: 0.04 [IU]/min via INTRAVENOUS
  Administered 2019-01-28: 0.01 [IU]/min via INTRAVENOUS
  Administered 2019-01-28: 0.04 [IU]/min via INTRAVENOUS
  Filled 2019-01-24 (×7): qty 2

## 2019-01-24 MED ORDER — VASOPRESSIN 20 UNIT/ML IV SOLN
INTRAVENOUS | Status: DC | PRN
  Administered 2019-01-24 (×2): 1 [IU] via INTRAVENOUS

## 2019-01-24 MED ORDER — POTASSIUM CHLORIDE 10 MEQ/50ML IV SOLN
10.0000 meq | INTRAVENOUS | Status: DC
  Administered 2019-01-24: 10 meq via INTRAVENOUS
  Filled 2019-01-24 (×4): qty 50

## 2019-01-24 MED ORDER — SODIUM CHLORIDE 0.9 % IV BOLUS
1000.0000 mL | Freq: Once | INTRAVENOUS | Status: AC
  Administered 2019-01-24: 1000 mL via INTRAVENOUS

## 2019-01-24 MED ORDER — NITROGLYCERIN IN D5W 200-5 MCG/ML-% IV SOLN
2.0000 ug/min | INTRAVENOUS | Status: DC
  Filled 2019-01-24: qty 250

## 2019-01-24 MED ORDER — MIDAZOLAM 50MG/50ML (1MG/ML) PREMIX INFUSION
0.0000 mg/h | INTRAVENOUS | Status: DC
  Administered 2019-01-24: 4 mg/h via INTRAVENOUS
  Administered 2019-01-25 (×3): 5 mg/h via INTRAVENOUS
  Administered 2019-01-26: 03:00:00 7 mg/h via INTRAVENOUS
  Filled 2019-01-24: qty 50
  Filled 2019-01-24: qty 100
  Filled 2019-01-24 (×3): qty 50

## 2019-01-24 MED ORDER — MIDAZOLAM HCL 5 MG/5ML IJ SOLN
INTRAMUSCULAR | Status: DC | PRN
  Administered 2019-01-24 (×2): 2 mg via INTRAVENOUS

## 2019-01-24 MED ORDER — LACTATED RINGERS IV SOLN
INTRAVENOUS | Status: DC
  Administered 2019-01-24: 21:00:00 via INTRAVENOUS

## 2019-01-24 MED ORDER — SODIUM CHLORIDE 0.9 % IV SOLN
12.0000 mg | Freq: Once | INTRAVENOUS | Status: AC
  Administered 2019-01-24: 12 mg via INTRAVENOUS
  Filled 2019-01-24: qty 12

## 2019-01-24 MED ORDER — POTASSIUM CHLORIDE 2 MEQ/ML IV SOLN
80.0000 meq | INTRAVENOUS | Status: DC
  Filled 2019-01-24: qty 40

## 2019-01-24 MED ORDER — MAGNESIUM SULFATE 50 % IJ SOLN
40.0000 meq | INTRAMUSCULAR | Status: DC
  Filled 2019-01-24: qty 9.85

## 2019-01-24 MED ORDER — DEXMEDETOMIDINE HCL IN NACL 400 MCG/100ML IV SOLN
0.1000 ug/kg/h | INTRAVENOUS | Status: AC
  Administered 2019-01-24: .2 ug/kg/h via INTRAVENOUS
  Filled 2019-01-24: qty 100

## 2019-01-24 MED ORDER — BISACODYL 10 MG RE SUPP: 10.0000 mg | Freq: Every day | RECTAL | Status: DC | PRN

## 2019-01-24 MED ORDER — MORPHINE SULFATE (PF) 2 MG/ML IV SOLN: 1.0000 mg | INTRAVENOUS | Status: DC | PRN

## 2019-01-24 MED ORDER — TRANEXAMIC ACID (OHS) PUMP PRIME SOLUTION
2.0000 mg/kg | INTRAVENOUS | Status: DC
  Filled 2019-01-24: qty 2.93

## 2019-01-24 MED ORDER — FENTANYL CITRATE (PF) 250 MCG/5ML IJ SOLN
INTRAMUSCULAR | Status: DC | PRN
  Administered 2019-01-24: 100 ug via INTRAVENOUS

## 2019-01-24 MED ORDER — PLASMA-LYTE 148 IV SOLN
INTRAVENOUS | Status: DC | PRN
  Administered 2019-01-24: 500 mL via INTRAVASCULAR

## 2019-01-24 MED ORDER — SODIUM BICARBONATE 8.4 % IV SOLN
INTRAVENOUS | Status: AC | PRN
  Administered 2019-01-23 (×2): 50 meq via INTRAVENOUS

## 2019-01-24 MED ORDER — EPINEPHRINE HCL 5 MG/250ML IV SOLN IN NS
0.5000 ug/min | INTRAVENOUS | Status: DC
  Administered 2019-01-24: 0.5 ug/min via INTRAVENOUS
  Filled 2019-01-24: qty 250

## 2019-01-24 MED ORDER — EPINEPHRINE PF 1 MG/ML IJ SOLN: 0.0000 ug/min | INTRAVENOUS | Status: DC

## 2019-01-24 MED ORDER — EPINEPHRINE PF 1 MG/ML IJ SOLN
0.5000 ug/min | INTRAVENOUS | Status: DC
  Administered 2019-01-24: 50 ug/min via INTRAVENOUS
  Administered 2019-01-24: 15 ug/min via INTRAVENOUS
  Administered 2019-01-24: 50 ug/min via INTRAVENOUS
  Filled 2019-01-24 (×8): qty 8

## 2019-01-24 MED ORDER — VECURONIUM BROMIDE 10 MG IV SOLR
INTRAVENOUS | Status: AC | PRN
  Administered 2019-01-23: 10 mg via INTRAVENOUS

## 2019-01-24 MED ORDER — SODIUM BICARBONATE 8.4 % IV SOLN
INTRAVENOUS | Status: AC
  Filled 2019-01-24: qty 100

## 2019-01-24 MED ORDER — INSULIN ASPART 100 UNIT/ML ~~LOC~~ SOLN
0.0000 [IU] | SUBCUTANEOUS | Status: DC
  Administered 2019-01-24: 7 [IU] via SUBCUTANEOUS

## 2019-01-24 MED ORDER — FENTANYL 2500MCG IN NS 250ML (10MCG/ML) PREMIX INFUSION
25.0000 ug/h | INTRAVENOUS | Status: DC
  Administered 2019-01-24: 09:00:00 50 ug/h via INTRAVENOUS
  Filled 2019-01-24: qty 250

## 2019-01-24 MED ORDER — NOREPINEPHRINE 4 MG/250ML-% IV SOLN
INTRAVENOUS | Status: DC | PRN
  Administered 2019-01-24: 70 ug/min via INTRAVENOUS
  Administered 2019-01-24: 60 ug/min via INTRAVENOUS

## 2019-01-24 MED ORDER — FENTANYL BOLUS VIA INFUSION
25.0000 ug | INTRAVENOUS | Status: DC | PRN
  Administered 2019-01-27: 25 ug via INTRAVENOUS
  Filled 2019-01-24: qty 25

## 2019-01-24 MED ORDER — ONDANSETRON HCL 4 MG/2ML IJ SOLN: 4.0000 mg | Freq: Four times a day (QID) | INTRAMUSCULAR | Status: DC | PRN

## 2019-01-24 MED ORDER — TRANEXAMIC ACID (OHS) BOLUS VIA INFUSION
15.0000 mg/kg | INTRAVENOUS | Status: DC
  Filled 2019-01-24: qty 2198

## 2019-01-24 MED ORDER — DOCUSATE SODIUM 100 MG PO CAPS
200.0000 mg | ORAL_CAPSULE | Freq: Every day | ORAL | Status: DC
  Administered 2019-01-29: 200 mg via ORAL
  Filled 2019-01-24: qty 2

## 2019-01-24 MED ORDER — NOREPINEPHRINE 4 MG/250ML-% IV SOLN
0.0000 ug/min | INTRAVENOUS | Status: DC
  Administered 2019-01-24 (×2): 40 ug/min via INTRAVENOUS
  Filled 2019-01-24 (×2): qty 250

## 2019-01-24 MED ORDER — VANCOMYCIN HCL 10 G IV SOLR
1500.0000 mg | INTRAVENOUS | Status: AC
  Administered 2019-01-24: 1500 mg via INTRAVENOUS
  Filled 2019-01-24: qty 1500

## 2019-01-24 MED ORDER — CALCIUM CHLORIDE 10 % IV SOLN
INTRAVENOUS | Status: DC | PRN
  Administered 2019-01-24: 300 mg via INTRAVENOUS

## 2019-01-24 MED ORDER — SODIUM CHLORIDE 0.9 % IV SOLN: INTRAVENOUS | Status: DC

## 2019-01-24 MED ORDER — CHLORHEXIDINE GLUCONATE 4 % EX LIQD
CUTANEOUS | Status: AC
  Filled 2019-01-24: qty 15

## 2019-01-24 MED ORDER — SODIUM CHLORIDE (PF) 0.9 % IJ SOLN
INTRAMUSCULAR | Status: AC
  Filled 2019-01-24: qty 10

## 2019-01-24 MED ORDER — ACETAMINOPHEN 650 MG RE SUPP
650.0000 mg | Freq: Once | RECTAL | Status: AC
  Administered 2019-01-24: 650 mg via RECTAL

## 2019-01-24 MED ORDER — SODIUM CHLORIDE 0.9 % IV SOLN
INTRAVENOUS | Status: DC
  Administered 2019-01-25 – 2019-01-26 (×2): via INTRAVENOUS

## 2019-01-24 MED ORDER — VANCOMYCIN HCL IN DEXTROSE 1-5 GM/200ML-% IV SOLN: 1000.0000 mg | Freq: Two times a day (BID) | INTRAVENOUS | Status: DC

## 2019-01-24 MED ORDER — SODIUM BICARBONATE 8.4 % IV SOLN
100.0000 meq | Freq: Once | INTRAVENOUS | Status: AC
  Administered 2019-01-24: 03:00:00 100 meq via INTRAVENOUS

## 2019-01-24 MED ORDER — SODIUM CHLORIDE 0.9 % IV SOLN: 250.0000 mL | INTRAVENOUS | Status: DC

## 2019-01-24 MED ORDER — CHLORHEXIDINE GLUCONATE CLOTH 2 % EX PADS: 6.0000 | MEDICATED_PAD | Freq: Once | CUTANEOUS | Status: DC

## 2019-01-24 MED ORDER — MILRINONE LACTATE IN DEXTROSE 20-5 MG/100ML-% IV SOLN
0.3000 ug/kg/min | INTRAVENOUS | Status: DC
  Filled 2019-01-24: qty 100

## 2019-01-24 MED ORDER — FENTANYL 2500MCG IN NS 250ML (10MCG/ML) PREMIX INFUSION
25.0000 ug/h | INTRAVENOUS | Status: DC
  Administered 2019-01-25 (×3): 200 ug/h via INTRAVENOUS
  Filled 2019-01-24 (×2): qty 250
  Filled 2019-01-24: qty 500

## 2019-01-24 MED ORDER — BISACODYL 5 MG PO TBEC: 5.0000 mg | DELAYED_RELEASE_TABLET | Freq: Once | ORAL | Status: DC

## 2019-01-24 MED ORDER — SODIUM CHLORIDE 0.9 % IV SOLN
1.5000 g | INTRAVENOUS | Status: AC
  Administered 2019-01-24: 1.5 g via INTRAVENOUS
  Filled 2019-01-24: qty 1.5

## 2019-01-24 MED ORDER — ROCURONIUM BROMIDE 10 MG/ML (PF) SYRINGE
PREFILLED_SYRINGE | INTRAVENOUS | Status: DC | PRN
  Administered 2019-01-24: 50 mg via INTRAVENOUS
  Administered 2019-01-24: 100 mg via INTRAVENOUS

## 2019-01-24 MED ORDER — SODIUM CHLORIDE 0.9 % IV SOLN
500.0000 mg | INTRAVENOUS | Status: DC
  Filled 2019-01-24: qty 500

## 2019-01-24 MED ORDER — NOREPINEPHRINE 4 MG/250ML-% IV SOLN
0.0000 ug/min | INTRAVENOUS | Status: DC
  Filled 2019-01-24: qty 250

## 2019-01-24 MED ORDER — INSULIN REGULAR(HUMAN) IN NACL 100-0.9 UT/100ML-% IV SOLN
INTRAVENOUS | Status: DC
  Administered 2019-01-24: 11.5 [IU]/h via INTRAVENOUS
  Filled 2019-01-24 (×2): qty 100

## 2019-01-24 MED ORDER — SODIUM CHLORIDE 0.9 % IV SOLN
1.0000 g | Freq: Once | INTRAVENOUS | Status: AC
  Administered 2019-01-24: 10:00:00 1 g via INTRAVENOUS
  Filled 2019-01-24: qty 10

## 2019-01-24 MED ORDER — BISACODYL 10 MG RE SUPP
10.0000 mg | Freq: Every day | RECTAL | Status: DC
  Administered 2019-01-27: 10 mg via RECTAL
  Filled 2019-01-24 (×3): qty 1

## 2019-01-24 MED ORDER — VASOPRESSIN 20 UNIT/ML IV SOLN
INTRAVENOUS | Status: AC
  Filled 2019-01-24: qty 1

## 2019-01-24 MED ORDER — SODIUM CHLORIDE 0.9% FLUSH
3.0000 mL | Freq: Two times a day (BID) | INTRAVENOUS | Status: DC
  Administered 2019-01-26: 3 mL via INTRAVENOUS
  Administered 2019-01-27: 5 mL via INTRAVENOUS
  Administered 2019-01-27 – 2019-01-31 (×6): 3 mL via INTRAVENOUS

## 2019-01-24 MED ORDER — EPINEPHRINE PF 1 MG/ML IJ SOLN
0.5000 ug/min | INTRAVENOUS | Status: DC
  Administered 2019-01-24: 22:00:00 30 ug/min via INTRAVENOUS
  Administered 2019-01-25 (×5): 36 ug/min via INTRAVENOUS
  Administered 2019-01-25: 03:00:00 25 ug/min via INTRAVENOUS
  Administered 2019-01-25: 30 ug/min via INTRAVENOUS
  Administered 2019-01-26: 35 ug/min via INTRAVENOUS
  Administered 2019-01-26: 23 ug/min via INTRAVENOUS
  Administered 2019-01-26 (×2): 36 ug/min via INTRAVENOUS
  Administered 2019-01-27 (×2): 23 ug/min via INTRAVENOUS
  Administered 2019-01-27: 21 ug/min via INTRAVENOUS
  Filled 2019-01-24 (×21): qty 8

## 2019-01-24 MED ORDER — SODIUM CHLORIDE 0.9 % IV SOLN
INTRAVENOUS | Status: DC
  Administered 2019-01-25 – 2019-01-26 (×2): via INTRAVENOUS

## 2019-01-24 MED ORDER — BISACODYL 5 MG PO TBEC
10.0000 mg | DELAYED_RELEASE_TABLET | Freq: Every day | ORAL | Status: DC
  Administered 2019-01-28 – 2019-02-03 (×5): 10 mg via ORAL
  Filled 2019-01-24 (×5): qty 2

## 2019-01-24 MED ORDER — STERILE WATER FOR INJECTION IV SOLN
INTRAVENOUS | Status: DC
  Administered 2019-01-24: 21:00:00 via INTRAVENOUS
  Filled 2019-01-24: qty 850

## 2019-01-24 MED ORDER — HEPARIN (PORCINE) 25000 UT/250ML-% IV SOLN
1600.0000 [IU]/h | INTRAVENOUS | Status: DC
  Administered 2019-01-24: 1600 [IU]/h via INTRAVENOUS
  Filled 2019-01-24 (×2): qty 250

## 2019-01-24 MED ORDER — ALBUMIN HUMAN 5 % IV SOLN
250.0000 mL | INTRAVENOUS | Status: AC | PRN
  Administered 2019-01-25: 12.5 g via INTRAVENOUS
  Filled 2019-01-24: qty 250

## 2019-01-24 MED ORDER — PLASMA-LYTE 148 IV SOLN
INTRAVENOUS | Status: DC
  Filled 2019-01-24: qty 2.5

## 2019-01-24 MED ORDER — FENTANYL CITRATE (PF) 100 MCG/2ML IJ SOLN: 25.0000 ug | Freq: Once | INTRAMUSCULAR | Status: AC

## 2019-01-24 MED ORDER — OXYCODONE HCL 5 MG PO TABS: 5.0000 mg | ORAL_TABLET | ORAL | Status: DC | PRN

## 2019-01-24 MED ORDER — SODIUM CHLORIDE 0.9 % IV SOLN
1.0000 g | INTRAVENOUS | Status: DC
  Filled 2019-01-24: qty 10

## 2019-01-24 MED ORDER — EPINEPHRINE HCL 5 MG/250ML IV SOLN IN NS
0.0000 ug/min | INTRAVENOUS | Status: DC
  Filled 2019-01-24: qty 250

## 2019-01-24 MED ORDER — TRAMADOL HCL 50 MG PO TABS: 50.0000 mg | ORAL_TABLET | ORAL | Status: DC | PRN

## 2019-01-24 MED ORDER — ALTEPLASE (PULMONARY EMBOLISM) INFUSION
50.0000 mg | Freq: Once | INTRAVENOUS | Status: AC
  Administered 2019-01-24: 50 mg via INTRAVENOUS
  Filled 2019-01-24: qty 50

## 2019-01-24 MED ORDER — IOHEXOL 300 MG/ML  SOLN
50.0000 mL | Freq: Once | INTRAMUSCULAR | Status: AC | PRN
  Administered 2019-01-24: 25 mL via INTRA_ARTERIAL

## 2019-01-24 MED ORDER — CHLORHEXIDINE GLUCONATE 0.12 % MT SOLN: 15.0000 mL | Freq: Once | OROMUCOSAL | Status: DC

## 2019-01-24 MED ORDER — POTASSIUM CHLORIDE 10 MEQ/50ML IV SOLN
10.0000 meq | INTRAVENOUS | Status: AC
  Administered 2019-01-24 (×2): 10 meq via INTRAVENOUS

## 2019-01-24 MED ORDER — HEPARIN SODIUM (PORCINE) 1000 UNIT/ML IJ SOLN
INTRAMUSCULAR | Status: DC | PRN
  Administered 2019-01-24: 5000 [IU] via INTRAVENOUS

## 2019-01-24 MED ORDER — SODIUM CHLORIDE 0.9 % IV SOLN: 250.0000 mL | INTRAVENOUS | Status: DC | PRN

## 2019-01-24 MED ORDER — SODIUM CHLORIDE 0.9 % IV SOLN
750.0000 mg | INTRAVENOUS | Status: DC
  Filled 2019-01-24: qty 750

## 2019-01-24 MED ORDER — NITROGLYCERIN IN D5W 200-5 MCG/ML-% IV SOLN
0.0000 ug/min | INTRAVENOUS | Status: DC
  Filled 2019-01-24: qty 250

## 2019-01-24 MED ORDER — TRANEXAMIC ACID 1000 MG/10ML IV SOLN
1.5000 mg/kg/h | INTRAVENOUS | Status: DC
  Filled 2019-01-24: qty 25

## 2019-01-24 MED ORDER — INSULIN REGULAR(HUMAN) IN NACL 100-0.9 UT/100ML-% IV SOLN
INTRAVENOUS | Status: DC
  Filled 2019-01-24: qty 100

## 2019-01-24 MED ORDER — INSULIN REGULAR(HUMAN) IN NACL 100-0.9 UT/100ML-% IV SOLN
INTRAVENOUS | Status: DC
  Administered 2019-01-25: 13 [IU]/h via INTRAVENOUS
  Administered 2019-01-25: 16 [IU]/h via INTRAVENOUS
  Administered 2019-01-26: 4.4 [IU]/h via INTRAVENOUS
  Administered 2019-01-27: 6 [IU]/h via INTRAVENOUS
  Administered 2019-01-28: 4.2 [IU]/h via INTRAVENOUS
  Administered 2019-01-28: 3.2 [IU]/h via INTRAVENOUS
  Administered 2019-01-28: 3.6 [IU]/h via INTRAVENOUS
  Filled 2019-01-24 (×8): qty 100

## 2019-01-24 MED ORDER — ASPIRIN 81 MG PO CHEW: 324.0000 mg | CHEWABLE_TABLET | Freq: Every day | ORAL | Status: DC

## 2019-01-24 MED ORDER — EPINEPHRINE 1 MG/10ML IJ SOSY
PREFILLED_SYRINGE | INTRAMUSCULAR | Status: AC | PRN
  Administered 2019-01-23: 0.2 mg via INTRAVENOUS
  Administered 2019-01-23: 1 via INTRAVENOUS

## 2019-01-24 MED ORDER — SODIUM CHLORIDE 0.45 % IV SOLN: INTRAVENOUS | Status: DC | PRN

## 2019-01-24 MED ORDER — METOCLOPRAMIDE HCL 5 MG/ML IJ SOLN
10.0000 mg | Freq: Four times a day (QID) | INTRAMUSCULAR | Status: AC
  Administered 2019-01-25 – 2019-01-29 (×18): 10 mg via INTRAVENOUS
  Filled 2019-01-24 (×15): qty 2

## 2019-01-24 MED ORDER — SODIUM BICARBONATE 8.4 % IV SOLN
INTRAVENOUS | Status: AC
  Filled 2019-01-24: qty 50

## 2019-01-24 MED ORDER — VASOPRESSIN 20 UNIT/ML IV SOLN
0.0400 [IU]/min | INTRAVENOUS | Status: DC
  Administered 2019-01-24: 0.04 [IU]/min via INTRAVENOUS
  Filled 2019-01-24 (×3): qty 2

## 2019-01-24 MED ORDER — NOREPINEPHRINE 16 MG/250ML-% IV SOLN
0.0000 ug/min | INTRAVENOUS | Status: DC
  Administered 2019-01-24 (×2): 40 ug/min via INTRAVENOUS
  Filled 2019-01-24 (×4): qty 250

## 2019-01-24 MED ORDER — METOPROLOL TARTRATE 25 MG/10 ML ORAL SUSPENSION: 12.5000 mg | Freq: Two times a day (BID) | ORAL | Status: DC

## 2019-01-24 MED ORDER — FAMOTIDINE IN NACL 20-0.9 MG/50ML-% IV SOLN
20.0000 mg | Freq: Two times a day (BID) | INTRAVENOUS | Status: DC
  Administered 2019-01-25 – 2019-01-27 (×7): 20 mg via INTRAVENOUS
  Filled 2019-01-24 (×6): qty 50

## 2019-01-24 MED ORDER — LACTATED RINGERS IV SOLN
INTRAVENOUS | Status: DC
  Administered 2019-01-25: 11:00:00 via INTRAVENOUS

## 2019-01-24 MED ORDER — CALCIUM CHLORIDE 10 % IV SOLN: 1.0000 g | Freq: Once | INTRAVENOUS | Status: DC | PRN

## 2019-01-24 MED ORDER — SODIUM CHLORIDE 0.9 % IV SOLN
INTRAVENOUS | Status: DC | PRN
  Administered 2019-01-24: 13:00:00 via INTRAVENOUS

## 2019-01-24 MED ORDER — SODIUM CHLORIDE 0.9 % IV SOLN
1.5000 g | Freq: Two times a day (BID) | INTRAVENOUS | Status: DC
  Administered 2019-01-25: 1.5 g via INTRAVENOUS
  Filled 2019-01-24 (×3): qty 1.5

## 2019-01-24 MED ORDER — PROPOFOL 10 MG/ML IV BOLUS
INTRAVENOUS | Status: AC
  Filled 2019-01-24: qty 20

## 2019-01-24 MED ORDER — ALBUMIN HUMAN 5 % IV SOLN
INTRAVENOUS | Status: DC | PRN
  Administered 2019-01-24 (×2): via INTRAVENOUS

## 2019-01-24 MED ORDER — METOPROLOL TARTRATE 12.5 MG HALF TABLET: 12.5000 mg | ORAL_TABLET | Freq: Once | ORAL | Status: DC

## 2019-01-24 MED ORDER — HEMOSTATIC AGENTS (NO CHARGE) OPTIME
TOPICAL | Status: DC | PRN
  Administered 2019-01-24 (×2): 1 via TOPICAL

## 2019-01-24 MED ORDER — METOPROLOL TARTRATE 12.5 MG HALF TABLET: 12.5000 mg | ORAL_TABLET | Freq: Two times a day (BID) | ORAL | Status: DC

## 2019-01-24 MED ORDER — NOREPINEPHRINE 16 MG/250ML-% IV SOLN
0.0000 ug/min | INTRAVENOUS | Status: DC
  Administered 2019-01-24: 25 ug/min via INTRAVENOUS
  Administered 2019-01-25: 16 ug/min via INTRAVENOUS
  Administered 2019-01-26: 10 ug/min via INTRAVENOUS
  Administered 2019-01-27: 24 ug/min via INTRAVENOUS
  Administered 2019-01-27: 22 ug/min via INTRAVENOUS
  Administered 2019-01-28: 24 ug/min via INTRAVENOUS
  Administered 2019-01-28: 25 ug/min via INTRAVENOUS
  Administered 2019-01-29: 28 ug/min via INTRAVENOUS
  Administered 2019-01-29: 24 ug/min via INTRAVENOUS
  Administered 2019-01-30: 38 ug/min via INTRAVENOUS
  Administered 2019-01-30: 50 ug/min via INTRAVENOUS
  Administered 2019-01-30: 28 ug/min via INTRAVENOUS
  Administered 2019-01-31: 30 ug/min via INTRAVENOUS
  Administered 2019-01-31: 50 ug/min via INTRAVENOUS
  Administered 2019-02-01: 40 ug/min via INTRAVENOUS
  Administered 2019-02-01: 38 ug/min via INTRAVENOUS
  Administered 2019-02-01: 44 ug/min via INTRAVENOUS
  Administered 2019-02-02: 38 ug/min via INTRAVENOUS
  Administered 2019-02-02: 50 ug/min via INTRAVENOUS
  Administered 2019-02-02: 36 ug/min via INTRAVENOUS
  Administered 2019-02-02: 30 ug/min via INTRAVENOUS
  Administered 2019-02-03: 50 ug/min via INTRAVENOUS
  Filled 2019-01-24 (×23): qty 250

## 2019-01-24 MED ORDER — ACETAMINOPHEN 500 MG PO TABS: 1000.0000 mg | ORAL_TABLET | Freq: Four times a day (QID) | ORAL | Status: AC

## 2019-01-24 MED ORDER — METOCLOPRAMIDE HCL 5 MG/ML IJ SOLN: 10.0000 mg | Freq: Four times a day (QID) | INTRAMUSCULAR | Status: DC

## 2019-01-24 MED ORDER — ACETAMINOPHEN 160 MG/5ML PO SOLN
1000.0000 mg | Freq: Four times a day (QID) | ORAL | Status: AC
  Administered 2019-01-28 – 2019-01-29 (×8): 1000 mg
  Filled 2019-01-24 (×8): qty 40.6

## 2019-01-24 MED ORDER — MAGNESIUM SULFATE 4 GM/100ML IV SOLN
4.0000 g | Freq: Once | INTRAVENOUS | Status: AC
  Administered 2019-01-25: 4 g via INTRAVENOUS
  Filled 2019-01-24 (×2): qty 100

## 2019-01-24 MED FILL — Medication: Qty: 1 | Status: AC

## 2019-01-24 SURGICAL SUPPLY — 81 items
ANCHOR CATH FOLEY SECURE (MISCELLANEOUS) ×16 IMPLANT
APPLIER CLIP 13 LRG OPEN (CLIP)
APR CLP LRG 13 20 CLIP (CLIP)
BAG DECANTER FOR FLEXI CONT (MISCELLANEOUS) ×3 IMPLANT
BLADE CLIPPER SURG (BLADE) ×3 IMPLANT
BLADE STERNUM SYSTEM 6 (BLADE) ×2 IMPLANT
BLADE SURG 11 STRL SS (BLADE) ×5 IMPLANT
CANNULA EZ GLIDE 8.0 24FR (CANNULA) ×2 IMPLANT
CANNULA FEM BIOMEDICUS 25FR (CANNULA) ×2 IMPLANT
CANNULA FEMORAL ART 14 SM (MISCELLANEOUS) IMPLANT
CATH ROBINSON RED A/P 18FR (CATHETERS) ×2 IMPLANT
CLIP APPLIE 13 LRG OPEN (CLIP) IMPLANT
CLIP VESOCCLUDE LG 6/CT (CLIP) ×5 IMPLANT
CLIP VESOCCLUDE MED 24/CT (CLIP) ×2 IMPLANT
CLIP VESOCCLUDE SM WIDE 24/CT (CLIP) ×2 IMPLANT
CONN 3/8X3/8 GISH STERILE (MISCELLANEOUS) ×6 IMPLANT
CONN 3/8X3/8 STRAIGHT W/LL (MISCELLANEOUS) ×3 IMPLANT
CONN ST 1/4X3/8  BEN (MISCELLANEOUS) ×2
CONN ST 1/4X3/8 BEN (MISCELLANEOUS) ×1 IMPLANT
COVER PROBE W GEL 5X96 (DRAPES) ×5 IMPLANT
COVER SURGICAL LIGHT HANDLE (MISCELLANEOUS) ×5 IMPLANT
COVER WAND RF STERILE (DRAPES) ×3 IMPLANT
DRAPE C-ARM 42X72 X-RAY (DRAPES) ×3 IMPLANT
DRAPE CARDIOVASCULAR INCISE (DRAPES) ×3
DRAPE INCISE IOBAN 66X45 STRL (DRAPES) ×7 IMPLANT
DRAPE SLUSH/WARMER DISC (DRAPES) ×3 IMPLANT
DRAPE SRG 135X102X78XABS (DRAPES) ×1 IMPLANT
DRSG AQUACEL AG ADV 3.5X10 (GAUZE/BANDAGES/DRESSINGS) ×2 IMPLANT
DRSG TEGADERM 4X4.5 CHG (GAUZE/BANDAGES/DRESSINGS) ×4 IMPLANT
ELECT CAUTERY BLADE 6.4 (BLADE) ×3 IMPLANT
ELECT REM PT RETURN 9FT ADLT (ELECTROSURGICAL) ×3
ELECTRODE REM PT RTRN 9FT ADLT (ELECTROSURGICAL) ×1 IMPLANT
FELT TEFLON 1X6 (MISCELLANEOUS) ×2 IMPLANT
FEMORAL VENOUS CANN RAP (CANNULA) IMPLANT
GAUZE SPONGE 4X4 12PLY STRL LF (GAUZE/BANDAGES/DRESSINGS) ×2 IMPLANT
GLOVE BIO SURGEON STRL SZ 6 (GLOVE) ×9 IMPLANT
GLOVE BIO SURGEON STRL SZ7 (GLOVE) ×4 IMPLANT
GLOVE BIO SURGEON STRL SZ7.5 (GLOVE) ×3 IMPLANT
GLOVE BIOGEL PI IND STRL 6.5 (GLOVE) IMPLANT
GLOVE BIOGEL PI INDICATOR 6.5 (GLOVE) ×2
GLOVE SURG SS PI 7.5 STRL IVOR (GLOVE) ×2 IMPLANT
GOWN STRL REUS W/ TWL LRG LVL3 (GOWN DISPOSABLE) ×4 IMPLANT
GOWN STRL REUS W/TWL LRG LVL3 (GOWN DISPOSABLE) ×12
GRAFT GELWEAVE IMPREG 10X30CM (Prosthesis & Implant Heart) ×2 IMPLANT
INSERT FOGARTY SM (MISCELLANEOUS) ×4 IMPLANT
KIT BASIN OR (CUSTOM PROCEDURE TRAY) ×3 IMPLANT
KIT DILATOR VASC 18G NDL (KITS) ×5 IMPLANT
LOOP VESSEL MAXI BLUE (MISCELLANEOUS) ×2 IMPLANT
LOOP VESSEL MINI RED (MISCELLANEOUS) ×2 IMPLANT
LOOP VESSEL SUPERMAXI WHITE (MISCELLANEOUS) ×2 IMPLANT
NS IRRIG 1000ML POUR BTL (IV SOLUTION) ×6 IMPLANT
PACK GENERAL/GYN (CUSTOM PROCEDURE TRAY) ×3 IMPLANT
PAD ARMBOARD 7.5X6 YLW CONV (MISCELLANEOUS) ×3 IMPLANT
POWDER SURGICEL 3.0 GRAM (HEMOSTASIS) ×2 IMPLANT
SCRUB POVIDONE IODINE 4 OZ (MISCELLANEOUS) ×3 IMPLANT
SEALANT SURG COSEAL 8ML (VASCULAR PRODUCTS) ×2 IMPLANT
SET CANNULATION TOURNIQUET (MISCELLANEOUS) IMPLANT
SET PRESSURE INFUSION 24 (MISCELLANEOUS) ×3 IMPLANT
SHEATH AVANTI 11CM 5FR (SHEATH) ×3 IMPLANT
SOL PREP POV-IOD 4OZ 10% (MISCELLANEOUS) ×3 IMPLANT
SPONGE INTESTINAL PEANUT (DISPOSABLE) ×3 IMPLANT
SPONGE LAP 4X18 RFD (DISPOSABLE) ×3 IMPLANT
SUT PROLENE 5 0 C 1 36 (SUTURE) ×45 IMPLANT
SUT PROLENE 6 0 C 1 30 (SUTURE) IMPLANT
SUT PROLENE 6 0 CC (SUTURE) ×12 IMPLANT
SUT SILK  1 MH (SUTURE) ×26
SUT SILK 1 MH (SUTURE) ×6 IMPLANT
SUT SILK 1 TIES 10X30 (SUTURE) ×2 IMPLANT
SUT VIC AB 1 CTX 18 (SUTURE) ×3 IMPLANT
SUT VIC AB 2-0 CTX 36 (SUTURE) ×3 IMPLANT
SUT VIC AB 3-0 SH 8-18 (SUTURE) ×3 IMPLANT
SUT VIC AB 3-0 X1 27 (SUTURE) ×2 IMPLANT
SYR 10ML LL (SYRINGE) ×5 IMPLANT
SYR 30ML LL (SYRINGE) ×5 IMPLANT
TOWEL GREEN STERILE (TOWEL DISPOSABLE) ×3 IMPLANT
TOWEL GREEN STERILE FF (TOWEL DISPOSABLE) ×3 IMPLANT
TRAY FOLEY MTR SLVR 16FR STAT (SET/KITS/TRAYS/PACK) ×1 IMPLANT
WATER STERILE IRR 1000ML POUR (IV SOLUTION) ×6 IMPLANT
WIRE AMPLATZ SSTIFF .035X260CM (WIRE) ×2 IMPLANT
WIRE J 3MM .035X145CM (WIRE) ×3 IMPLANT
YANKAUER SUCT BULB TIP NO VENT (SUCTIONS) ×2 IMPLANT

## 2019-01-24 NOTE — Progress Notes (Signed)
Pt transported to OR 14 on ventilator and INO with no complications. Pt placed on Anesthesia vent upon arrival and RT ensured NO running properly

## 2019-01-24 NOTE — CV Procedure (Signed)
  ECMO INITIATION   Patient: Philip Wolfe, [birth year], 68 y.o. Location:   Date of Service:  02/10/2019     Time: 4:40 PM  Date of Admission: 01/29/2019 Admitting diagnosis: Acute hypoxemic respiratory failure (HCC)  Ht: 5' 9.02" (175.3 cm) Wt: (!) 146.5 kg BSA: Body surface area is 2.67 meters squared.  Blood Type: Conflict (See Lab Report): A POS/A POS Performed at [facility], [place] [address] [place], [place] [address]  Allergies: No Known Allergies  Past medical history:  Past Medical History:  Diagnosis Date  . Arthritis   . Hypertension   . Sleep apnea   . Ulcer left medial ankle  . Venous stasis ulcer ([place])    Past surgical history:  Past Surgical History:  Procedure Laterality Date  . ENDOVENOUS ABLATION SAPHENOUS VEIN W/ LASER  11-05-2010  Left Greater Saphenous Vein  . GASTRIC BYPASS    . varicose vein strippin g      Indication for ECMO: Other  ECMO was deployed at 1130 and initiated at 1619  Anticoagulation achieved with Heparin bolus of 5000 units given to patient at 1445. ACT: 236 sec. Cannulated for ECMO Mode: VA and achieved initial ECMO Flow (LPM): 4.49 and ECMO Sweep Gas (LPM): 2.    ECMO Cannula Information     Staff Present  Primary Perfusionist Gweneth Fritter, CCP, [place]  Assisting Perfusionist/ECMO Specialist Warden Fillers, RN  Cannulating Physician Dr. Darcey Nora   ECMO Lot Numbers  CardioHelp Console  #2  Oxygenator [id number]  [place]  [id number]  ECMO Goals  Cardiovascular Goals   Flow 4.0 lpm-5.0 lpm  Respiratory/ABG Goals   Normalized  Other Goals     Anticoagulation Goals   PTT 60-80   ECMO Handoff  Patient Information * Age Height Weight BSA IBW BMI  68 y.o. 5' 9.02" (175.3 cm)  ((!) 146.5 kg Body surface area is 2.67 meters squared. 72.7 kg  Body mass index is 47.67 kg/m.   Review History * Primary Diagnosis   Acute hypoxemic respiratory failure (HCC)  Prior Cardiac Arrest within 24hrs of ECMO initiation? yes  ECMO  and MCS * Type ECMO Flow ECMO Sweep Gases   ECMO Device: Cardiohelp   Flow (LPM): 4.49   Sweep Gas (LPM): 2     Additional Mechanical Support   Ventilation *    $ Ventilator Initial/Subsequent : Subsequent, Vent Mode: PRVC, Vt Set: 600 mL, Set Rate: 35 bmp, FiO2 (%): 40 %, I Time: 0.8 Sec(s), PEEP: 8 cmH20     Access Sites * Arterial    24 Fr. Through 74mm graft right axillary   Central    Peripheral  25 Fr. Multi stage right femoral vein   *Cannula(e) sutured and anchored, secured and dressed.   Infusions and Interventions * Drugs/Dose    Interventions/Blood Products     Labs and Imaging *  *Cannulation position verified via imaging on arrival to ICU. Concerns communicated to attending surgeon. Labs reviewed.   All ECMO safety checks complete. ECMO flowsheet initiated, applicable charges captured, LDA's entered/confirmed, imaging and labs verified, blood products available, and report given to Warden Fillers.

## 2019-01-24 NOTE — Progress Notes (Signed)
PHARMACY NOTE:  ANTIMICROBIAL RENAL DOSAGE ADJUSTMENT  Current antimicrobial regimen includes a mismatch between antimicrobial dosage and estimated renal function.  As per policy approved by the Pharmacy & Therapeutics and Medical Executive Committees, the antimicrobial dosage will be adjusted accordingly.  Current antimicrobial dosage:  Vancomycin 1g IV q12h x 4 doses post-op  Indication: Post-op surgical prophylaxis  Renal Function:  Estimated Creatinine Clearance: 44.1 mL/min (A) (by C-G formula based on SCr of 2.32 mg/dL (H)). []      On intermittent HD, scheduled: []      On CRRT    Antimicrobial dosage has been changed to:  Vancomycin 1g IV q24h x 2 doses  Additional comments:  Alycia Rossetti, PharmD, BCPS Clinical Pharmacist 01/25/2019 10:24 PM   .

## 2019-01-24 NOTE — Code Documentation (Signed)
Pulse check, pt has pulse back

## 2019-01-24 NOTE — Transfer of Care (Signed)
Immediate Anesthesia Transfer of Care Note  Patient: Philip Wolfe  Procedure(s) Performed: CANNULATION FOR VA ECMO (EXTRACORPOREAL MEMBRANE OXYGENATION) (N/A ) TRANSESOPHAGEAL ECHOCARDIOGRAM (TEE) (N/A )  Patient Location: IR  Anesthesia Type:General  Level of Consciousness: sedated and Patient remains intubated per anesthesia plan  Airway & Oxygen Therapy: Patient remains intubated per anesthesia plan and Patient placed on Ventilator (see vital sign flow sheet for setting)  Post-op Assessment: Report given to RN and Post -op Vital signs reviewed and stable  Post vital signs: Reviewed and stable  Last Vitals:  Vitals Value Taken Time  BP    Temp    Pulse    Resp    SpO2      Last Pain:  Vitals:   01/31/2019 0400  TempSrc: Bladder  PainSc:          Complications: None noted  Transported to IR from OR 14.  Transport assist from RRT, Ecmo specialist, and circulating nurse.  Report to CVICU at bedside prior to transport.  Tolerated transport well.  Drips titrated to maintain map >70. Transported and let on ICU ventilator.  Nitric remains @ 20ppm. Second unit of blood started by CVICU nurse once positioned on table in IR.  Surgeon at bedside.

## 2019-01-24 NOTE — Progress Notes (Signed)
  Echocardiogram 2D Echocardiogram has been performed.  Philip Wolfe 2019/02/04, 9:05 AM

## 2019-01-24 NOTE — Progress Notes (Signed)
  Echocardiogram Echocardiogram Transesophageal has been performed.  Bobbye Charleston 01/28/2019, 2:34 PM

## 2019-01-24 NOTE — Anesthesia Preprocedure Evaluation (Signed)
Anesthesia Evaluation  Patient identified by MRN, date of birth, ID band Patient unresponsive  Preop documentation limited or incomplete due to emergent nature of procedure.  Airway Mallampati: Intubated       Dental   Pulmonary sleep apnea , PE    + decreased breath sounds      Cardiovascular hypertension, + DVT   Rhythm:regular Rate:Tachycardia     Neuro/Psych    GI/Hepatic   Endo/Other    Renal/GU Renal InsufficiencyRenal disease     Musculoskeletal  (+) Arthritis ,   Abdominal   Peds  Hematology   Anesthesia Other Findings   Reproductive/Obstetrics                             Anesthesia Physical Anesthesia Plan  ASA: IV and emergent  Anesthesia Plan: General   Post-op Pain Management:    Induction: Intravenous  PONV Risk Score and Plan: 2 and Ondansetron, Dexamethasone and Treatment may vary due to age or medical condition  Airway Management Planned: Oral ETT  Additional Equipment: Arterial line, CVP, PA Cath, TEE and Ultrasound Guidance Line Placement  Intra-op Plan:   Post-operative Plan: Post-operative intubation/ventilation  Informed Consent: I have reviewed the patients History and Physical, chart, labs and discussed the procedure including the risks, benefits and alternatives for the proposed anesthesia with the patient or authorized representative who has indicated his/her understanding and acceptance.       Plan Discussed with: CRNA, Anesthesiologist and Surgeon  Anesthesia Plan Comments:         Anesthesia Quick Evaluation

## 2019-01-24 NOTE — Progress Notes (Signed)
Output: 150 mL of bright red blood from mouth/nose.   Philip Wolfe

## 2019-01-24 NOTE — Progress Notes (Signed)
NAME:  Philip Wolfe, MRN:  [id number], DOB:  1950/09/04, LOS: 1 ADMISSION DATE:  01/21/2019, CONSULTATION DATE:  02/09/2019 REFERRING MD:  Dr. Madilyn Hook EDP, CHIEF COMPLAINT:  Hypoxia   Brief History   68 year old male admitted 12/7 with massive PE s/p thrombolytics.  History of present illness   68 year old male with past medical history as below, which is significant for hypertension, obstructive sleep apnea on CPAP, and prediabetes.  Of note he has presented 3 times recently to his PCP for Covid testing with concerns of exposures.  Most recently on November 22.  He presented to [facility] on 12/7 with complaints of acute onset shortness of breath.  He reports the symptoms have been intermittent and progressive over the past few weeks, but became very severe in the several hours leading to his presentation.  Upon arrival to the emergency department his oxygen saturations were in the 60s on a nonrebreather.  There was concern for coronavirus due to his relative comfort despite O2 sats in the 60s.  His POC COVID test came back negative and he was started on BiPAP with some improvement in the symptoms and oxygen saturations to the low 90s. CXR was relatively clear considering his symptoms and there was concern for PE. CT angiogram was ordered and PCCM was asked to evaluate for admission.   On evaluation in ED patient in respiratory distress on BiPAP with oxygen saturation 72-80%. Intubated by PCCM. Oxygen saturation 30-60% while Bagging with PEEP at 20. Progressively hypoxic, coded for about 5 minutes. Given TPA.   Past Medical History   has a past medical history of Arthritis, Hypertension, Sleep apnea, Ulcer (left medial ankle), and Venous stasis ulcer (HCC).   Significant Hospital Events   12/7 admit   Consults:  Cardiology/ECMO 12/8 >>  Procedures:  ETT 12/7 >> Right Femoral CVC 12/7 >> Left Radial Aline 12/7 >>  Significant Diagnostic Tests:  CTA Chest 12/7 > Massive bilateral  central pulmonary emboli with significant decrease in pulmonary arterial flow. Right heart strain is noted. Patchy ground-glass infiltrate in the left upper lobe. Patient has negative COVID-19 status and this may represent acute pneumonic infiltrate or sequelae from the known pulmonary emboli. Undisplaced rib fractures on the right as described without Pneumothorax. No other focal abnormality is noted. CT Head 12/7 > Generalized atrophy and chronic ischemic microangiopathy without acute intracranial abnormality.  Micro Data:  Blood 12/7 >> U/A 12/7 >>   Antimicrobials:  Azithromycin 12/7 Rocephin 12/7  Interim history/subjective:  Remains critically ill on multiple pressors.  Objective   Blood pressure (!) 91/55, pulse 97, temperature (!) 100.6 F (38.1 C), resp. rate 15, height 5\' 9"  (1.753 m), weight (!) 146.5 kg, SpO2 100 %.    Vent Mode: PRVC FiO2 (%):  [40 %-100 %] 40 % Set Rate:  [35 bmp] 35 bmp Vt Set:  [560 mL] 560 mL PEEP:  [10 cmH20-16 cmH20] 10 cmH20 Plateau Pressure:  [28 cmH20-30 cmH20] 30 cmH20   Intake/Output Summary (Last 24 hours) at 01/18/2019 0827 Last data filed at 02/07/2019 0600 Gross per 24 hour  Intake 2099.58 ml  Output 350 ml  Net 1749.58 ml   Filed Weights   02/07/2019 1444 01/29/2019 2354 01/30/2019 0500  Weight: (!) 137 kg (!) 144.2 kg (!) 146.5 kg    Examination: GEN: obese man in bed HEENT: R nares rhinorocket in place, some blood from corner of mouth CV: Distant, tachycardic, ext lukewarm PULM: Diminished bases, no wheezing GI: Soft,  hypoactive BS EXT: Chronic lymphedema NEURO: Was moving both ext yesterday evening PSYCH: Sedated SKIN: Pale, venous stasis changes of extremities   Resolved Hospital Problem list     Assessment & Plan:  # Acute cor pulmonale due to massive PE- s/p 100mg  tPA yesterday evening during code.  Another 50mg  tPA this AM around 3 AM for worsening shock.  # Obesity, OSA on CPAP # Reactive severe hyperglycemia #  Reactive leukocytosis, PCT neg # Developing pulmonary infarcts # Acute kidney injury with acidosis # Acute hypoxemic and hypercarbic respiratory failure- latest ABG shows ventilatory insufficiency despite high MV indicating huge dead space from PE # R nares epistaxis- rhinorocket in place, some oozing persists, H/H stable  Remains extremely tenuous, discussed with Bensihmon, bedside echo poor windows but not obvious RV dilation.  Await formal echo, called echo dept to do ASAP.  Would be poor VA candidate at present given recent TPA.  Continue epi/norepi/vasopressin, wean as able  Try to get to a spot where either EKOS or catheter-guided embolectomy can be attempted.  Check heparin levels, watch bleeding, trend H/H.  Switch propofol to fentanyl gtt  Start insulin gtt  Will have to deviate from the 6-8cc/kg protocol given amount of dead space, MV currently 21-22, increased TV to 600cc with rate 32, now around 23, need to keep pH > 7.2 if able.  Continue bicarb gtt  Will update wife later today  Best practice:  Diet: NPO Pain/Anxiety/Delirium protocol (if indicated): Fentanyl  VAP protocol (if indicated) Ordered DVT prophylaxis: Systemic  GI prophylaxis: PPI Glucose control: endotool Mobility: Bedrest Code Status: FC Family Communication: Wife Updated  Disposition: Admit to ICU   Critical care time: 69 minutes     Erskine Emery MD PCCM

## 2019-01-24 NOTE — Progress Notes (Signed)
ANTICOAGULATION CONSULT NOTE  Pharmacy Consult for Heparin  Indication: pulmonary embolus  No Known Allergies  Patient Measurements: Height: 5' 9.02" (175.3 cm) Weight: (!) 322 lb 15.6 oz (146.5 kg) IBW/kg (Calculated) : 70.74 Heparin Dosing Weight: 103 kg  Vital Signs: Temp: 100.9 F (38.3 C) (12/08 1100) Temp Source: Bladder (12/08 0400) BP: 92/59 (12/08 1100) Pulse Rate: 98 (12/08 1100)  Labs: Recent Labs    01/20/2019 1500  02/15/2019 1504  02/15/2019 1507 01/18/2019 1657  01/18/2019 2150  02/09/2019 0029 02/12/2019 0206 02/12/2019 0454 01/27/2019 0750 01/21/2019 1030 01/31/2019 1111  HGB  --   --  16.6   < >  --   --    < > 14.7   < >  --  14.6 13.9 13.3  --   --   HCT  --   --  52.0   < >  --   --    < > 47.1   < >  --  43.0 44.2 39.0  --   --   PLT  --   --  242  --   --   --   --  250  --   --   --  246  --   --   --   APTT  --   --   --   --   --   --   --   --   --  50*  --   --   --   --   --   LABPROT  --   --  14.7  --   --   --   --   --   --  17.6*  --   --   --   --  19.2*  INR  --   --  1.2  --   --   --   --   --   --  1.5*  --   --   --   --  1.6*  HEPARINUNFRC  --   --   --   --   --   --   --   --   --   --   --   --   --  0.64  --   CREATININE  --    < > 1.51*  --  1.30*  --   --  1.80*  --   --   --  2.33*  --   --   --   TROPONINIHS 35*  --   --   --   --  315*  --   --   --  884*  --   --   --   --   --    < > = values in this interval not displayed.    Estimated Creatinine Clearance: 43.9 mL/min (A) (by C-G formula based on SCr of 2.33 mg/dL (H)).  Assessment: 68 y.o. male with massive PE s/p alteplase for heparin  Patient currently being assessed for possible VA ECMO.  Heparin drip may be held and/or changed to ACT monitoring if they proceed  Goal of Therapy:  Heparin level 0.3-0.7 units/ml Monitor platelets by anticoagulation protocol: Yes   Plan:  Continue heparin 1600 units/hr Check confirmatory heparin level in 6-8 hours.  F/u VA ECMO  Alanda Slim, PharmD, [facility] Clinical Pharmacist Please see AMION for all Pharmacists' Contact Phone Numbers 02/04/2019, 12:09 PM

## 2019-01-24 NOTE — Anesthesia Procedure Notes (Signed)
Central Venous Catheter Insertion Performed by: Albertha Ghee, MD, anesthesiologist Start/End12/21/2020 1:30 PM, 02/08/2019 1:36 PM Patient location: Pre-op. Preanesthetic checklist: patient identified, IV checked, site marked, risks and benefits discussed, surgical consent, monitors and equipment checked, pre-op evaluation, timeout performed and anesthesia consent Patient sedated Hand hygiene performed  and maximum sterile barriers used  Catheter size: 9 Fr MAC introducer Procedure performed using ultrasound guided technique. Ultrasound Notes:anatomy identified, needle tip was noted to be adjacent to the nerve/plexus identified, no ultrasound evidence of intravascular and/or intraneural injection and image(s) printed for medical record Attempts: 1 Following insertion, line sutured and dressing applied. Post procedure assessment: blood return through all ports, free fluid flow and no air  Patient tolerated the procedure well with no immediate complications.

## 2019-01-24 NOTE — Brief Op Note (Signed)
01/26/2019 - 01/31/2019  4:27 PM  PATIENT:  Philip Wolfe  68 y.o. male  PRE-OPERATIVE DIAGNOSIS:  PE SHOCK  POST-OPERATIVE DIAGNOSIS:  PE SHOCK  PROCEDURES:  -CANNULATION FOR VA ECMO (EXTRACORPOREAL MEMBRANE OXYGENATION) USING 48fr RIGHT AXILLARY ARTERIAL CANNULA and 24fr RIGHT FEMORAL VENOUS CANNULA  -TRANSESOPHAGEAL ECHOCARDIOGRAM (TEE) (N/A)  SURGEON:  Ivin Poot, MD - Primary  PHYSICIAN ASSISTANT: [place]   ANESTHESIA:   general  EBL:  700 mL   BLOOD ADMINISTERED: 1 unit  PRBC  DRAINS: none   LOCAL MEDICATIONS USED:  NONE  SPECIMEN:  No Specimen  DISPOSITION OF SPECIMEN:  N/A  COUNTS:  YES  DICTATION: .Dragon Dictation  PLAN OF CARE: Admit to inpatient   PATIENT DISPOSITION:  ICU - intubated and hemodynamically stable.   Delay start of Pharmacological VTE agent (>24hrs) due to surgical blood loss or risk of bleeding: yes

## 2019-01-24 NOTE — Anesthesia Procedure Notes (Signed)
Arterial Line Insertion Start/End12/30/2020 5:50 PM, 02/02/2019 6:00 PM Performed by: Lillia Abed, MD, anesthesiologist  Preanesthetic checklist: patient identified, IV checked, risks and benefits discussed, surgical consent, monitors and equipment checked, pre-op evaluation, timeout performed and anesthesia consent Patient sedated Left, brachial was placed Catheter size: 20 G Hand hygiene performed  and Seldinger technique used  Attempts: 1 Procedure performed using ultrasound guided technique. Ultrasound Notes:needle tip was noted to be adjacent to the nerve/plexus identified and no ultrasound evidence of intravascular and/or intraneural injection Following insertion, line sutured, dressing applied and Biopatch. Post procedure assessment: normal  Patient tolerated the procedure well with no immediate complications.

## 2019-01-24 NOTE — Consult Note (Signed)
Advanced Heart Failure Team Consult Note   Primary Physician: Derinda Late, MD PCP-Cardiologist:  No primary care provider on file.  Reason for Consultation: RV failure/cardiogenic shock  HPI:    Philip Wolfe is seen today for evaluation of RV failure/cardiogenic shock at the request of Dr. Tamala Julian.   68 y.o. with history of morbid obesity s/p gastric bypass, OSA on CPAP, and HTN is seen with RV failure s/p massive PE.  COVID-19 negative.  Per his wife, he had been developing progressive exertional dyspnea for at least several weeks.  Prior to admission on 12/7, he became acutely worse . Oxygen saturation in the markedly low in the ER.  CTA chest showed massive bilateral central PEs with significantly decreased PAD flow and RV strain, also with ground glass infiltrate LUL. Initially started on Bipap, in ER had PEA arrest with 5 minutes CPR and was intubated. After arrest, he moved purposefully.  He received 100 mg IV tPA last night and another 50 mg this morning around 3 am. Post-procedure, he had right nares epistaxis with ongoing oozing.  He is now on heparin gtt.   ABG shows mixed acidosis, PaCO2 remains elevated with significant dead space ventilation. He is now on epinephrine 50, norepinephrine 40, vasopressin 0.04.  Echo was difficult but showed RV failure with D-shaped septum, LV EF probably 45-50% but not well-visualized.  He is oliguric with rising creatinine, 2.3 currently. Too unstable currently to go to IR.   Review of Systems: Unable to obtain, intubated.   Home Medications Prior to Admission medications   Medication Sig Start Date End Date Taking? Authorizing Provider  amLODipine (NORVASC) 10 MG tablet Take 10 mg by mouth daily.      [provider]  fish oil-omega-3 fatty acids 1000 MG capsule Take 1 g by mouth daily.      [provider]  losartan (COZAAR) 100 MG tablet Take 100 mg by mouth daily.      [provider]  Multiple Vitamin  (MULTIVITAMIN) tablet Take 1 tablet by mouth daily.      [provider]  mupirocin (BACTROBAN) 2 % ointment Apply topically 3 (three) times daily.      [provider]  nebivolol (BYSTOLIC) 10 MG tablet Take 10 mg by mouth daily.      [provider]  spironolactone (ALDACTONE) 50 MG tablet Take 50 mg by mouth daily.      [provider]    Past Medical History: Past Medical History:  Diagnosis Date   Arthritis    Hypertension    Sleep apnea    Ulcer left medial ankle   Venous stasis ulcer ([place])     Past Surgical History: Past Surgical History:  Procedure Laterality Date   ENDOVENOUS ABLATION SAPHENOUS VEIN W/ LASER  11-05-2010  Left Greater Saphenous Vein   GASTRIC BYPASS     varicose vein strippin g      Family History: Family History  Problem Relation Age of Onset   Hypertension Mother     Social History: Social History   Socioeconomic History   Marital status: Married    Spouse name: Not on file   Number of children: Not on file   Years of education: Not on file   Highest education level: Not on file  Occupational History   Not on file  Social Needs   Financial resource strain: Not on file   Food insecurity    Worry: Not on file  Inability: Not on file   Transportation needs    Medical: Not on file    Non-medical: Not on file  Tobacco Use   Smoking status: Never Smoker   Smokeless tobacco: Never Used  Substance and Sexual Activity   Alcohol use: No   Drug use: No   Sexual activity: Not on file  Lifestyle   Physical activity    Days per week: Not on file    Minutes per session: Not on file   Stress: Not on file  Relationships   Social connections    Talks on phone: Not on file    Gets together: Not on file    Attends religious service: Not on file    Active member of club or organization: Not on file    Attends meetings of clubs or organizations: Not on file    Relationship status:  Not on file  Other Topics Concern   Not on file  Social History Narrative   Not on file    Allergies:  No Known Allergies  Objective:    Vital Signs:   Temp:  [97.4 F (36.3 C)-100.6 F (38.1 C)] 100.6 F (38.1 C) (12/08 1030) Pulse Rate:  [54-114] 98 (12/08 1030) Resp:  [12-39] 31 (12/08 1030) BP: (76-155)/(47-93) 95/61 (12/08 1000) SpO2:  [10 %-100 %] 100 % (12/08 1030) Arterial Line BP: (72-110)/(48-68) 72/68 (12/08 1030) FiO2 (%):  [40 %-100 %] 40 % (12/08 0754) Weight:  [137 kg-146.5 kg] 146.5 kg (12/08 0500) Last BM Date: (PTA)  Weight change: Filed Weights   01/25/2019 1444 01/22/2019 2354 01/19/2019 0500  Weight: (!) 137 kg (!) 144.2 kg (!) 146.5 kg    Intake/Output:   Intake/Output Summary (Last 24 hours) at 01/28/2019 1116 Last data filed at 01/31/2019 1000 Gross per 24 hour  Intake 4044.3 ml  Output 370 ml  Net 3674.3 ml      Physical Exam    General:  Intubated/sedated HEENT: normal Neck: Thick, JVP difficult. Carotids 2+ bilat; no bruits. No lymphadenopathy or thyromegaly appreciated. Cor: PMI nonpalpable. Regular rate & rhythm. No rubs, gallops or murmurs. Lungs: Decreased at bases.  Abdomen: soft, nontender, nondistended. No hepatosplenomegaly. No bruits or masses. Good bowel sounds. Extremities: no cyanosis, clubbing, rash. 1+ chronic edema to knees.  Neuro: alert & orientedx3, cranial nerves grossly intact. moves all 4 extremities w/o difficulty. Affect pleasant   Telemetry   NSR 90s  EKG    NSR, LAFB, ?old inferior and anterior MIs  Labs   Basic Metabolic Panel: Recent Labs  Lab 02/10/2019 1504  01/17/2019 1507  02/15/2019 2150 02/04/2019 2157 01/29/2019 0007 02/05/2019 0029 02/04/2019 0206 02/05/2019 0454 01/25/2019 0750  NA 139   < >  --    < > 142 140 138  --  141 144 142  K 3.7   < >  --    < > 4.3 4.2 5.5*  --  3.8 3.4* 3.6  CL 107  --   --   --  107  --   --   --   --  104  --   CO2 17*  --   --   --  20*  --   --   --   --  19*  --     GLUCOSE 200*  --   --   --  242*  --   --   --   --  390*  --   BUN 40*  --   --   --  42*  --   --   --   --  54*  --   CREATININE 1.51*  --  1.30*  --  1.80*  --   --   --   --  2.33*  --   CALCIUM 9.6  --   --   --  7.9*  --   --   --   --  7.6*  --   MG  --   --   --   --   --   --   --  2.0  --  2.1  --   PHOS  --   --   --   --   --   --   --   --   --  9.1*  --    < > = values in this interval not displayed.    Liver Function Tests: Recent Labs  Lab 01/29/2019 1504  AST 42*  ALT 39  ALKPHOS 91  BILITOT 0.4  PROT 8.1  ALBUMIN 4.0   No results for input(s): LIPASE, AMYLASE in the last 168 hours. No results for input(s): AMMONIA in the last 168 hours.  CBC: Recent Labs  Lab 02/02/2019 1504  01/21/2019 2150 02/10/2019 2157 01/25/2019 0007 01/31/2019 0206 02/12/2019 0454 01/31/2019 0750  WBC 15.6*  --  27.4*  --   --   --  26.5*  --   NEUTROABS 10.6*  --   --   --   --   --   --   --   HGB 16.6   < > 14.7 15.6 14.6 14.6 13.9 13.3  HCT 52.0   < > 47.1 46.0 43.0 43.0 44.2 39.0  MCV 94.2  --  94.0  --   --   --  94.0  --   PLT 242  --  250  --   --   --  246  --    < > = values in this interval not displayed.    Cardiac Enzymes: No results for input(s): CKTOTAL, CKMB, CKMBINDEX, TROPONINI in the last 168 hours.  BNP: BNP (last 3 results) Recent Labs    02/16/2019 1505  BNP 57.6    ProBNP (last 3 results) No results for input(s): PROBNP in the last 8760 hours.   CBG: Recent Labs  Lab 02/10/2019 0226 01/29/2019 0510 01/21/2019 0734 01/26/2019 0909  GLUCAP 280* 334* 419* 436*    Coagulation Studies: Recent Labs    02/12/2019 1504 01/31/2019 0029  LABPROT 14.7 17.6*  INR 1.2 1.5*     Imaging   Ct Head Wo Contrast  Result Date: 02/02/2019 CLINICAL DATA:  Encephalopathy EXAM: CT HEAD WITHOUT CONTRAST TECHNIQUE: Contiguous axial images were obtained from the base of the skull through the vertex without intravenous contrast. COMPARISON:  None. FINDINGS: Brain: There is no  mass, hemorrhage or extra-axial collection. There is generalized atrophy without lobar predilection. Hypodensity of the white matter is most commonly associated with chronic microvascular disease. Vascular: No abnormal hyperdensity of the major intracranial arteries or dural venous sinuses. No intracranial atherosclerosis. Skull: Fluid levels in the frontal and maxillary sinuses. Moderate ethmoid sinus opacification. Sinuses/Orbits: No fluid levels or advanced mucosal thickening of the visualized paranasal sinuses. No mastoid or middle ear effusion. The orbits are normal. 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 Electronically Signed   By: Ulyses Jarred M.D.   On: 02/12/2019 23:32   Ct Angio Chest Pe W/cm &/or Wo Cm  Result Date: 01/29/2019 CLINICAL DATA:  Shortness of breath and recent CPR EXAM: CT ANGIOGRAPHY CHEST WITH CONTRAST TECHNIQUE: Multidetector CT imaging of the chest was performed using the standard protocol during bolus administration of intravenous contrast. Multiplanar CT image reconstructions and MIPs were obtained to evaluate the vascular anatomy. CONTRAST:  48m OMNIPAQUE IOHEXOL 350 MG/ML SOLN COMPARISON:  Chest x-ray from earlier in the same day. FINDINGS: Cardiovascular: Thoracic aorta demonstrates mild atherosclerotic calcifications. No aneurysmal dilatation or dissection is seen. Pulmonary artery is well visualized within normal branching pattern. Large bilateral pulmonary emboli are identified with near complete loss of pulmonary arterial flow on the right and significant decrease on the left. Changes consistent with right heart strain are noted. Mediastinum/Nodes: Thoracic inlet is within normal limits. No hilar or mediastinal adenopathy is noted. The esophagus as visualized is within normal limits. Endotracheal tube is noted in satisfactory position. Lungs/Pleura: Lungs are well aerated bilaterally. Patchy ground-glass  infiltrate is noted in the left upper lobe in a perihilar distribution. No other focal infiltrate is seen. No sizable effusion or pneumothorax is noted. Upper Abdomen: Visualized upper abdomen shows gastric lap band in satisfactory position. No other focal abnormality is noted. Musculoskeletal: Degenerative changes of the thoracic spine are seen. Some undisplaced rib fractures are noted anteriorly involving the fourth, fifth and sixth ribs on the right. Review of the MIP images confirms the above findings. IMPRESSION: Massive bilateral central pulmonary emboli with significant decrease in pulmonary arterial flow. Right heart strain is noted. Patchy ground-glass infiltrate in the left upper lobe. Patient has negative COVID-19 status and this may represent acute pneumonic infiltrate or sequelae from the known pulmonary emboli. Undisplaced rib fractures on the right as described without pneumothorax. No other focal abnormality is noted. Aortic Atherosclerosis (ICD10-I70.0). Critical Value/emergent results were called by telephone at the time of interpretation on 02/05/2019 at 11:33 pm to Dr. RRalene Bathe who verbally acknowledged these results. Electronically Signed   By: MInez CatalinaM.D.   On: 02/07/2019 23:41   Dg Chest Portable 1 View  Result Date: 02/10/2019 CLINICAL DATA:  Status post intubation, recent CPR EXAM: PORTABLE CHEST 1 VIEW COMPARISON:  Film from earlier in the same day. FINDINGS: Patchy opacity is again noted in the left mid lung stable from the prior exam. Lungs are otherwise clear. Endotracheal tube is noted in satisfactory position. A rounded metallic artifact is noted overlying the upper chest related to patient's hand. No other focal abnormality is noted. IMPRESSION: Stable opacity over the left lung. Endotracheal tube in satisfactory position. Extrinsic artifact over the upper chest. Electronically Signed   By: MInez CatalinaM.D.   On: 02/08/2019 20:52   Dg Chest Portable 1 View  Result Date:  01/26/2019 CLINICAL DATA:  Shortness of breath increasing over the past few hours EXAM: PORTABLE CHEST 1 VIEW COMPARISON:  01/27/2007 FINDINGS: Cardiac shadow is stable. Tortuosity of the thoracic aorta is again seen. Patchy left perihilar infiltrate is noted. No sizable effusion is seen. Old rib fractures on the right are seen. IMPRESSION: Patchy left perihilar infiltrate. Followup films following appropriate therapy are recommended. Electronically Signed   By: MInez CatalinaM.D.   On: 01/21/2019 17:08      Medications:     Current Medications:  sodium chloride   Intravenous Once   chlorhexidine gluconate (MEDLINE KIT)  15 mL Mouth Rinse BID   Chlorhexidine Gluconate Cloth  6 each Topical Q0600   mouth rinse  15 mL Mouth Rinse 10 times per day  pantoprazole (PROTONIX) IV  40 mg Intravenous QHS     Infusions:  azithromycin (ZITHROMAX) 500 MG IVPB (Vial-Mate [employer])     cefTRIAXone (ROCEPHIN)  IV     dextrose 5 % and 0.45% NaCl     epinephrine 50 mcg/min (01/29/2019 1106)   fentaNYL infusion INTRAVENOUS 200 mcg/hr (01/22/2019 1000)   heparin 1,600 Units/hr (02/16/2019 1000)   insulin 15 Units/hr (01/25/2019 1032)   norepinephrine (LEVOPHED) Adult infusion 40 mcg/min (02/05/2019 1034)   propofol (DIPRIVAN) infusion 10 mcg/kg/min (02/05/2019 1000)    sodium bicarbonate (isotonic) infusion in sterile water 100 mL/hr at 02/14/2019 1000   vasopressin (PITRESSIN) infusion - *FOR SHOCK* 0.04 Units/min (01/28/2019 1000)       Assessment/Plan   1.  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  Currently on high doses of norepinephrine and epinephrine as well as vasopressin 0.04 to maintain stable MAP.  Minimal UOP with AKI.  Has had tPA, but at this point too unstable to go for IR procedure.  I think that our best chance to get him through this will be VA ECMO support.  - Discussed with Drs Tamala Julian and Prescott Gum, to go to OR for [facility] ECMO today . - Once  stabilized on ECMO, would like to get him to IR for possible intervention (?catheter-directed tPA). Need to preserve right IJ for IR.  - Continue pressor/inotrope support until ECMO can be placed.  - Reasonable to try iNO.  2. AKI: Oliguric currently.  Suspect ATN in setting of cardiogenic shock.  Need to restore adequate cardiac output, plan VA ECMO.  3. Acute hypoxic/hypoxemic respiratory failure: Adequate oxygenation but still with mixed acidosis in setting of significant dead space ventilation and elevated PaCO2.   - ECMO as above.  4. ID: LUL infiltrate, PCT not elevated.  - Covering with ceftriaxone/azithromycin.   Length of Stay: 1  Loralie Champagne, MD  02/02/2019, 11:16 AM  Advanced Heart Failure Team Pager [phone] (M-F; 7a - 4p)  Please contact [facility] for night-coverage after hours (4p -7a ) and weekends on [email or web address]

## 2019-01-24 NOTE — Consult Note (Signed)
[address]       [place] [address]             [id number]        Chong R Modisette [place] [facility] Medical Record #[id number] Date of Birth: 1950-05-13  Referring Erskine Emery CCM Primary Care: Derinda Late, MD Primary Cardiologist:No primary care provider on file.  Chief Complaint:    Chief Complaint  Patient presents with  . Shortness of Breath  Patient examined, images of CTA  and transthoracic echocardiogram personally reviewed.  Patient discussed with Dr. Aundra Dubin cardiology and Dr. Tamala Julian of critical care medicine for coordination of care.  Patient condition diagnosis and plan of care discussed with patients wife  at the bedside.  History of Present Illness:     68 year old morbidly obese male with history of varicose veins and venous stasis of both lower extremities presents with acute shortness of breath requiring intubation in the ED.  CTA on admission showed large bilateral pulmonary emboli.  He required hemodynamic support for hypotension due to RV dysfunction on echo.  He was given 2 doses of TPA lysis of the wound by without improvement.  He is on high-dose pressors with mixed respiratory and metabolic acidosis. Injection of the thrombi with central catheters placed by interventional radiology has been recommended however the patient is hemodynamically unstable.  Interventional radiology will proceed with the directed lytic therapy to the bilateral pulmonary emboli but hemodynamically stable.  Patient's physicians have recommended veno -arterial ECMO to support the patient while the central pulmonary emboli are being treated.  I discussed the procedure of ECMO cannulation with the patient's wife.  The patient has severe morbid obesity with large abdominal panniculus over the groins.  I do not feel that femoral arterial cannulation would be safe so right axillary artery cannulation for inflow from the circuit is planned as well as a percutaneous venous cannula to the  right atrium from the right femoral vein.  I would not recommend central cannulation as the patient has had significant nosebleeding following repeated doses of TPA.  Current Activity/ Functional Status: Prior to his acute illness patient was fairly functional.  He had a right knee replacement at [facility] 2 years ago.  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  Lost 50 pounds.   Zubrod Score: At the time of surgery this patient's most appropriate activity status/level should be described as: '[]'     0    Normal activity, no symptoms '[]'     1    Restricted in physical strenuous activity but ambulatory, able to do out light work '[]'     2    Ambulatory and capable of self care, unable to do work activities, up and about                 more than 50%  Of the time                            '[]'     3    Only limited self care, in bed greater than 50% of waking hours '[]'     4    Completely disabled, no self care, confined to bed or chair '[x]'     5    Moribund  Past Medical History:  Diagnosis Date  . Arthritis   . Hypertension   . Sleep apnea   . Ulcer left medial ankle  . Venous stasis ulcer (  [facility])     Past Surgical History:  Procedure Laterality Date  . ENDOVENOUS ABLATION SAPHENOUS VEIN W/ LASER  11-05-2010  Left Greater Saphenous Vein  . GASTRIC BYPASS    . varicose vein strippin g      Social History   Tobacco Use  Smoking Status Never Smoker  Smokeless Tobacco Never Used    Social History   Substance and Sexual Activity  Alcohol Use No     No Known Allergies  Current Facility-Administered Medications  Medication Dose Route Frequency Provider Last Rate Last Dose  . 0.9 %  sodium chloride infusion (Manually program via Guardrails IV Fluids)   Intravenous Once [name], NP      . azithromycin (ZITHROMAX) 500 mg in sodium chloride 0.9 % 250 mL IVPB  500 mg Intravenous Q24H Hayden Pedro M, NP      . bisacodyl (DULCOLAX) EC tablet 5 mg  5 mg Oral Once Prescott Gum,  Collier Salina, MD      . cefTRIAXone (ROCEPHIN) 1 g in sodium chloride 0.9 % 100 mL IVPB  1 g Intravenous Q24H Hayden Pedro M, NP      . cefUROXime (ZINACEF) 1.5 g in sodium chloride 0.9 % 100 mL IVPB  1.5 g Intravenous To OR Prescott Gum, Collier Salina, MD      . cefUROXime (ZINACEF) 750 mg in sodium chloride 0.9 % 100 mL IVPB  750 mg Intravenous To OR Prescott Gum, Collier Salina, MD      . Derrill Memo ON 01/25/2019] chlorhexidine (PERIDEX) 0.12 % solution 15 mL  15 mL Mouth/Throat Once Prescott Gum, Collier Salina, MD      . chlorhexidine gluconate (MEDLINE KIT) (PERIDEX) 0.12 % solution 15 mL  15 mL Mouth Rinse BID Collier Bullock, MD   15 mL at 01/31/2019 0738  . Chlorhexidine Gluconate Cloth 2 % PADS 6 each  6 each Topical Q0600 Collier Bullock, MD      . Chlorhexidine Gluconate Cloth 2 % PADS 6 each  6 each Topical Once Prescott Gum, Collier Salina, MD      . dexmedetomidine (PRECEDEX) 400 MCG/100ML (4 mcg/mL) infusion  0.1-0.7 mcg/kg/hr Intravenous To OR Prescott Gum, Collier Salina, MD      . dextrose 5 %-0.45 % sodium chloride infusion   Intravenous Continuous Candee Furbish, MD      . dextrose 50 % solution 0-50 mL  0-50 mL Intravenous PRN Candee Furbish, MD      . EPINEPHrine (ADRENALIN) 4 mg in NS 250 mL (0.016 mg/mL) premix infusion  0-10 mcg/min Intravenous To OR Prescott Gum, Collier Salina, MD      . EPINEPHrine (ADRENALIN) 8 mg in dextrose 5 % 250 mL (0.032 mg/mL) infusion  0.5-50 mcg/min Intravenous Titrated [name], NP 93.8 mL/hr at 02/14/2019 1106 50 mcg/min at 01/31/2019 1106  . fentaNYL (SUBLIMAZE) injection 25 mcg  25 mcg Intravenous Q15 min PRN [name], NP      . fentaNYL (SUBLIMAZE) injection 25-100 mcg  25-100 mcg Intravenous Q30 min PRN [name], NP   100 mcg at 01/31/2019 0937  . fentaNYL 2534mg in NS 2557m(1022mml) infusion-PREMIX  25-200 mcg/hr Intravenous Continuous SmiCandee FurbishD 20 mL/hr at 02/14/2019 1000 200 mcg/hr at 02/02/2019 1000  . heparin 2,500 Units, papaverine 30 mg in electrolyte-148 (PLASMALYTE-148)  500 mL irrigation   Irrigation To OR VanPrescott GumetCollier SalinaD      . heparin 30,000 units/NS 1000 mL solution for CELLSAVER   Other To OR VanIvin Poot  MD      . heparin ADULT infusion 100 units/mL (25000 units/246m sodium chloride 0.45%)  1,600 Units/hr Intravenous Continuous GCollier Bullock MD 16 mL/hr at 01/22/2019 1000 1,600 Units/hr at 02/10/2019 1000  . insulin regular, human (MYXREDLIN) 100 units/ 100 mL infusion   Intravenous Continuous SCandee Furbish MD 15 mL/hr at 01/18/2019 1032 15 Units/hr at 02/11/2019 1032  . insulin regular, human (MYXREDLIN) 100 units/ 100 mL infusion   Intravenous To OR VPrescott Gum PCollier Salina MD      . magnesium sulfate (IV Push/IM) injection 40 mEq  40 mEq Other To OR VPrescott Gum PCollier Salina MD      . MEDLINE mouth rinse  15 mL Mouth Rinse 10 times per day GCollier Bullock MD   15 mL at 02/12/2019 1120  . [START ON 01/25/2019] metoprolol tartrate (LOPRESSOR) tablet 12.5 mg  12.5 mg Oral Once VPrescott Gum PCollier Salina MD      . milrinone (PRIMACOR) 20 MG/100 ML (0.2 mg/mL) infusion  0.3 mcg/kg/min Intravenous To OR VPrescott Gum PCollier Salina MD      . nitroGLYCERIN 50 mg in dextrose 5 % 250 mL (0.2 mg/mL) infusion  2-200 mcg/min Intravenous To OR VPrescott Gum PCollier Salina MD      . norepinephrine (LEVOPHED) 16 mg in 258mpremix infusion  0-40 mcg/min Intravenous Titrated EuOmar PersonNP 37.5 mL/hr at 01/21/2019 1034 40 mcg/min at 01/26/2019 1034  . norepinephrine (LEVOPHED) 58m66mn 250m60memix infusion  0-40 mcg/min Intravenous To OR Van Prescott GumteCollier Salina      . pantoprazole (PROTONIX) injection 40 mg  40 mg Intravenous QHS EubaOmar Person   40 mg at 02/10/2019 0307  . phenylephrine (NEOSYNEPHRINE) 20-0.9 MG/250ML-% infusion  30-200 mcg/min Intravenous To OR Van Prescott GumteCollier Salina      . potassium chloride injection 80 mEq  80 mEq Other To OR Van Prescott GumteCollier Salina      . propofol (DIPRIVAN) 1000 MG/100ML infusion  0-50 mcg/kg/min Intravenous Continuous EubaOmar Person 8.22 mL/hr at 02/02/2019  1000 10 mcg/kg/min at 02/10/2019 1000  . sodium bicarbonate 150 mEq in sterile water 1,000 mL infusion   Intravenous Continuous EubaOmar Person 100 mL/hr at 02/11/2019 1000    . tranexamic acid (CYKLOKAPRON) 2,500 mg in sodium chloride 0.9 % 250 mL (10 mg/mL) infusion  1.5 mg/kg/hr Intravenous To OR Van Prescott GumteCollier Salina      . tranexamic acid (CYKLOKAPRON) bolus via infusion - over 30 minutes 2,197.5 mg  15 mg/kg Intravenous To OR Van Prescott GumteCollier Salina      . tranexamic acid (CYKLOKAPRON) pump prime solution 293 mg  2 mg/kg Intracatheter To OR Van Prescott GumteCollier Salina      . vancomycin (VANCOCIN) 1,500 mg in sodium chloride 0.9 % 250 mL IVPB  1,500 mg Intravenous To OR Van Prescott GumteCollier Salina      . vasopressin (PITRESSIN) 40 Units in sodium chloride 0.9 % 250 mL (0.16 Units/mL) infusion  0.04 Units/min Intravenous Continuous SmitCandee Furbish 15 mL/hr at 01/22/2019 1000 0.04 Units/min at 01/28/2019 1000    Medications Prior to Admission  Medication Sig Dispense Refill Last Dose  . amLODipine (NORVASC) 10 MG tablet Take 10 mg by mouth daily.       . fish oil-omega-3 fatty acids 1000 MG capsule Take 1 g by mouth daily.       . loMarland Kitchenartan (COZAAR) 100 MG tablet Take 100 mg by mouth daily.       .Marland Kitchen  Multiple Vitamin (MULTIVITAMIN) tablet Take 1 tablet by mouth daily.       . mupirocin (BACTROBAN) 2 % ointment Apply topically 3 (three) times daily.       . nebivolol (BYSTOLIC) 10 MG tablet Take 10 mg by mouth daily.       Marland Kitchen spironolactone (ALDACTONE) 50 MG tablet Take 50 mg by mouth daily.         Family History  Problem Relation Age of Onset  . Hypertension Mother      Review of Systems:   ROS Information received from patient's wife No history of previous cardiac disease No blood thinners prior to this presentation Right-hand-dominant Type 2 diabetes No drug allergies now No history of previous thoracic trauma    Cardiac Review of Systems: Y or  [    ]= no  Chest Pain [    ]  Resting SOB Blue.Reese    ] Exertional SOB  Blue.Reese  ]  Pontianus.Latina [  ]   Pedal Edema [   ]    Palpitations [  ] Syncope  [  ]   Presyncope [   ]  General Review of Systems: [Y] = yes [  ]=no Constitional: recent weight change [  ]; anorexia [  ]; fatigue [  ]; nausea [  ]; night sweats [  ]; fever [  ]; or chills [  ]                                                               Dental: Last Dentist visit:   Eye : blurred vision [  ]; diplopia [   ]; vision changes [  ];  Amaurosis fugax[  ]; Resp: cough [  ];  wheezing[  ];  hemoptysis[  ]; shortness of breath[  ]; paroxysmal nocturnal dyspnea[  ]; dyspnea on exertion[  ]; or orthopnea[  ];  GI:  gallstones[  ], vomiting[  ];  dysphagia[  ]; melena[  ];  hematochezia [  ]; heartburn[  ];   Hx of  Colonoscopy[  ]; GU: kidney stones [  ]; hematuria[  ];   dysuria [  ];  nocturia[  ];  history of     obstruction [  ]; urinary frequency [  ]             Skin: rash, swelling[  ];, hair loss[  ];  peripheral edema[  ];  or itching[  ]; Musculosketetal: myalgias[  ];  joint swelling[  ];  joint erythema[  ];  joint pain[  ];  back pain[  ];  Heme/Lymph: bruising[  ];  bleeding[  ];  anemia[  ];  Neuro: TIA[  ];  headaches[  ];  stroke[  ];  vertigo[  ];  seizures[  ];   paresthesias[  ];  difficulty walking[  ];  Psych:depression[  ]; anxiety[  ];  Endocrine: diabetes[ y ];  thyroid dysfunction[  ];        Covid test negative      Physical Exam: BP (!) 92/59   Pulse 98   Temp (!) 100.9 F (38.3 C)   Resp (!) 35   Ht 5' 9.02" (1.753 m)   Wt (!) 146.5 kg   SpO2 100%  BMI 47.67 kg/m        Physical Exam  General: Obese sedated Caucasian male invaded with right nasal balloon for nasal bleeding HEENT: Normocephalic pupils equal , dentition adequate Neck: Supple without JVD, adenopathy, or bruit Chest: Clear to auscultation, symmetrical breath sounds, no rhonchi, no tenderness             or deformity Cardiovascular: Regular rate and rhythm, no murmur, no gallop,  peripheral pulses             palpable in all extremities Abdomen:  Soft, nontender, no palpable mass or organomegaly Extremities cool without palpable pedal pulses, no clubbing               Severe bilateral venous stasis changes of the legs is present Rectal/GU: Deferred Neuro: Sedated, head CT scan recently shows no stroke Skin: Clean and dry without rash or ulceration   Diagnostic Studies & Laboratory data:     Recent Radiology Findings:   Ct Head Wo Contrast  Result Date: 02/15/2019 CLINICAL DATA:  Encephalopathy EXAM: CT HEAD WITHOUT CONTRAST TECHNIQUE: Contiguous axial images were obtained from the base of the skull through the vertex without intravenous contrast. COMPARISON:  None. FINDINGS: Brain: There is no mass, hemorrhage or extra-axial collection. There is generalized atrophy without lobar predilection. Hypodensity of the white matter is most commonly associated with chronic microvascular disease. Vascular: No abnormal hyperdensity of the major intracranial arteries or dural venous sinuses. No intracranial atherosclerosis. Skull: Fluid levels in the frontal and maxillary sinuses. Moderate ethmoid sinus opacification. Sinuses/Orbits: No fluid levels or advanced mucosal thickening of the visualized paranasal sinuses. No mastoid or middle ear effusion. The orbits are normal. 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 Electronically Signed   By: Ulyses Jarred M.D.   On: 01/29/2019 23:32   Ct Angio Chest Pe W/cm &/or Wo Cm  Result Date: 02/13/2019 CLINICAL DATA:  Shortness of breath and recent CPR EXAM: CT ANGIOGRAPHY CHEST WITH CONTRAST TECHNIQUE: Multidetector CT imaging of the chest was performed using the standard protocol during bolus administration of intravenous contrast. Multiplanar CT image reconstructions and MIPs were obtained to evaluate the vascular anatomy. CONTRAST:  33m OMNIPAQUE IOHEXOL 350 MG/ML SOLN  COMPARISON:  Chest x-ray from earlier in the same day. FINDINGS: Cardiovascular: Thoracic aorta demonstrates mild atherosclerotic calcifications. No aneurysmal dilatation or dissection is seen. Pulmonary artery is well visualized within normal branching pattern. Large bilateral pulmonary emboli are identified with near complete loss of pulmonary arterial flow on the right and significant decrease on the left. Changes consistent with right heart strain are noted. Mediastinum/Nodes: Thoracic inlet is within normal limits. No hilar or mediastinal adenopathy is noted. The esophagus as visualized is within normal limits. Endotracheal tube is noted in satisfactory position. Lungs/Pleura: Lungs are well aerated bilaterally. Patchy ground-glass infiltrate is noted in the left upper lobe in a perihilar distribution. No other focal infiltrate is seen. No sizable effusion or pneumothorax is noted. Upper Abdomen: Visualized upper abdomen shows gastric lap band in satisfactory position. No other focal abnormality is noted. Musculoskeletal: Degenerative changes of the thoracic spine are seen. Some undisplaced rib fractures are noted anteriorly involving the fourth, fifth and sixth ribs on the right. Review of the MIP images confirms the above findings. IMPRESSION: Massive bilateral central pulmonary emboli with significant decrease in pulmonary arterial flow. Right heart strain is noted. Patchy ground-glass infiltrate in the left upper lobe. Patient has negative COVID-19 status and this may represent acute  pneumonic infiltrate or sequelae from the known pulmonary emboli. Undisplaced rib fractures on the right as described without pneumothorax. No other focal abnormality is noted. Aortic Atherosclerosis (ICD10-I70.0). Critical Value/emergent results were called by telephone at the time of interpretation on 02/16/2019 at 11:33 pm to Dr. Ralene Bathe, who verbally acknowledged these results. Electronically Signed   By: Inez Catalina M.D.    On: 02/10/2019 23:41   Dg Chest Portable 1 View  Result Date: 01/20/2019 CLINICAL DATA:  Status post intubation, recent CPR EXAM: PORTABLE CHEST 1 VIEW COMPARISON:  Film from earlier in the same day. FINDINGS: Patchy opacity is again noted in the left mid lung stable from the prior exam. Lungs are otherwise clear. Endotracheal tube is noted in satisfactory position. A rounded metallic artifact is noted overlying the upper chest related to patient's hand. No other focal abnormality is noted. IMPRESSION: Stable opacity over the left lung. Endotracheal tube in satisfactory position. Extrinsic artifact over the upper chest. Electronically Signed   By: Inez Catalina M.D.   On: 01/22/2019 20:52   Dg Chest Portable 1 View  Result Date: 02/11/2019 CLINICAL DATA:  Shortness of breath increasing over the past few hours EXAM: PORTABLE CHEST 1 VIEW COMPARISON:  01/27/2007 FINDINGS: Cardiac shadow is stable. Tortuosity of the thoracic aorta is again seen. Patchy left perihilar infiltrate is noted. No sizable effusion is seen. Old rib fractures on the right are seen. IMPRESSION: Patchy left perihilar infiltrate. Followup films following appropriate therapy are recommended. Electronically Signed   By: Inez Catalina M.D.   On: 02/14/2019 17:08     I have independently reviewed the above radiologic studies and discussed with the patient   Recent Lab Findings: Lab Results  Component Value Date   WBC 26.5 (H) 01/19/2019   HGB 13.3 01/25/2019   HCT 39.0 02/11/2019   PLT 246 01/22/2019   GLUCOSE 390 (H) 02/13/2019   TRIG 224 (H) 01/29/2019   ALT 39 02/09/2019   AST 42 (H) 02/15/2019   NA 142 02/07/2019   K 3.6 01/29/2019   CL 104 02/13/2019   CREATININE 2.33 (H) 01/21/2019   BUN 54 (H) 01/22/2019   CO2 19 (L) 02/13/2019   INR 1.6 (H) 01/29/2019   HGBA1C 6.3 (H) 01/25/2019      Assessment / Plan:   Bilateral pulmonary emboli with acute respiratory failure and cardiogenic shock from RV failure  Minimal  response to peripherally injected TPA, patient now on high-dose pressors with mixed respiratory and metabolic acidosis consistent with cardiogenic shock. VA ECMO is recommended to stabilize the patient's hemodynamics and provide adequate oxygenation and ventilation.  The patient when stable will be transferred to interventional radiology for catheter injection of the bilateral large pulmonary emboli.  I discussed the risks of ECMO cannulation with the patient's wife including risks of bleeding, extremity ischemia and amputation, infection, organ failure, death.  She is provided for consent.        '@ME1' @ 02/16/2019 11:55 AM

## 2019-01-24 NOTE — Progress Notes (Addendum)
Initial Nutrition Assessment  DOCUMENTATION CODES:   Morbid obesity  INTERVENTION:  Once pt stable, recommend placement of a post pyloric small bore feeding tube. Initiate Vital 1.5 at 20 ml/hr advancing 10 ml every 12 hours until goal rate of 28ml/hr is achieved.   Provide 60 ml prostat TID  Initial TF rate provides 720 kcal and 32 grams of protein.  At goal rate, this regimen provides 2040 kcal, 155 grams protein and 733 ml free water  This regimen along with current propofol rate provides 2251 kilocalories. Monitor rate and adjust TF as appropriate  NUTRITION DIAGNOSIS:   Inadequate oral intake related to inability to eat as evidenced by NPO status.  GOAL:   Patient will meet greater than or equal to 90% of their needs  MONITOR:   Vent status, Labs, Weight trends, TF tolerance, Skin, I & O's  REASON FOR ASSESSMENT:   Ventilator    ASSESSMENT:   68 y.o. male with PMH of Arthritis, Hypertension, Sleep apnea, s/p gastric bypass. Presented to ED with progressive SOB. O2 sats in 60s, in respiratory distress on BiPAP in ED, intubated by PCCM. On CT pulmonary emboli identified, and right heart strain noted. Pt has passed several large blood clots from nose and mouth, has significant clot burden per NP.    12/7  -pt admitted with cadiogenic shock due to passive PE s/p tPA, too unstable for IR 12/8  - Pt to OR for ECMO in hopes of stabilizing for IR. -started insulin drip -remains on high rate pressors  -AKI.  Patient is currently intubated on ventilator support Vent Mode: PRVC FiO2 (%):  [40 %-100 %] 40 % Set Rate:  [35 bmp] 35 bmp Vt Set:  [560 mL-600 mL] 600 mL PEEP:  [8 cmH20-16 cmH20] 8 cmH20 Plateau Pressure:  [20 cmH20-30 cmH20] 20 cmH20  Medications reviewed and include: Protonix, zithromax, rochephin, dextrose 5% @ 75 ml/hr, heparin, myxredlin 11.5 units; levophed @ 32mcg; propofol @ 10 mcg; bicarb; vasopressin;  Calories provided by propofol at 43ml/hr  provides 211 kcal/day  Labs reviewed:  CBGs: 280-436; na 142; K 3.6; mag 2.1; phos 9.1 (H); creat 2.33 (H); BUN 54 (H) UOP: 350 mL I&O: 1.7 L positive MAP: 62 (56-71)   NUTRITION - FOCUSED PHYSICAL EXAM: Deferred; pt going to surgery  Diet Order:   Diet Order            Diet NPO time specified  Diet effective midnight        Diet NPO time specified  Diet effective now              EDUCATION NEEDS:   Not appropriate for education at this time  Skin:  Skin Assessment: Reviewed RN Assessment  Last BM:  unknown  Height:   Ht Readings from Last 1 Encounters:  February 11, 2019 5' 9.02" (1.753 m)    Weight:   Wt Readings from Last 1 Encounters:  02/11/19 (!) 146.5 kg    Ideal Body Weight:  72.7 kg  BMI:  Body mass index is 47.67 kg/m.  Estimated Nutritional Needs:   Kcal:  6440-3474  Protein:  145 grams  Fluid:  >/= 1.6 L  Meda Klinefelter, Dietetic Intern

## 2019-01-24 NOTE — Consult Note (Addendum)
Chief Complaint: Patient was seen in consultation today for massive PE  Referring Physician(s): Charlotte Sanes  Patient Status: [facility] - In-pt  History of Present Illness: Philip Wolfe is a 68 y.o. male who presented with sudden onset of shortness of breath and mild SOB for a few weeks.  Patient's oxygen saturations were very low in the 60s.  Patient coded and was intubated and given systematic TPA.  Reportedly, patient showed some immediate improvement after the TPA.  CTA of the chest after the TPA, demonstrates large bilateral PE with significant right heart strain.  CT head was negative for hemorrhage but patient has bleeding from nose and mouth (possibly related to attempted OG tube placement or intubation).  Heparin was temporarily stopped but heparin has been restarted.  Patient is on pressors with MAP in 60s.    Past Medical History:  Diagnosis Date  . Arthritis   . Hypertension   . Sleep apnea   . Ulcer left medial ankle  . Venous stasis ulcer (HCC)     Past Surgical History:  Procedure Laterality Date  . ENDOVENOUS ABLATION SAPHENOUS VEIN W/ LASER  11-05-2010  Left Greater Saphenous Vein  . GASTRIC BYPASS    . varicose vein strippin g      Allergies: Patient has no known allergies.  Medications: Prior to Admission medications   Medication Sig Start Date End Date Taking? Authorizing Provider  amLODipine (NORVASC) 10 MG tablet Take 10 mg by mouth daily.      [provider]  fish oil-omega-3 fatty acids 1000 MG capsule Take 1 g by mouth daily.      [provider]  losartan (COZAAR) 100 MG tablet Take 100 mg by mouth daily.      [provider]  Multiple Vitamin (MULTIVITAMIN) tablet Take 1 tablet by mouth daily.      [provider]  mupirocin (BACTROBAN) 2 % ointment Apply topically 3 (three) times daily.      [provider]  nebivolol (BYSTOLIC) 10 MG tablet Take 10 mg by mouth daily.      [provider]   spironolactone (ALDACTONE) 50 MG tablet Take 50 mg by mouth daily.      [provider]     Family History  Problem Relation Age of Onset  . Hypertension Mother     Social History   Socioeconomic History  . Marital status: Married    Spouse name: Not on file  . Number of children: Not on file  . Years of education: Not on file  . Highest education level: Not on file  Occupational History  . Not on file  Social Needs  . Financial resource strain: Not on file  . Food insecurity    Worry: Not on file    Inability: Not on file  . Transportation needs    Medical: Not on file    Non-medical: Not on file  Tobacco Use  . Smoking status: Never Smoker  . Smokeless tobacco: Never Used  Substance and Sexual Activity  . Alcohol use: No  . Drug use: No  . Sexual activity: Not on file  Lifestyle  . Physical activity    Days per week: Not on file    Minutes per session: Not on file  . Stress: Not on file  Relationships  . Social Musician on phone: Not on file    Gets together: Not on file    Attends religious service: Not on  file    Active member of club or organization: Not on file    Attends meetings of clubs or organizations: Not on file    Relationship status: Not on file  Other Topics Concern  . Not on file  Social History Narrative  . Not on file      Review of Systems  Vital Signs: BP (!) 87/53   Pulse 82   Temp 97.8 F (36.6 C) (Axillary)   Resp (!) 39   Ht 5\' 9"  (1.753 m)   Wt (!) 144.2 kg   SpO2 99%   BMI 46.95 kg/m   Physical Exam  Intubated Blood around mouth and nose  Imaging: Ct Head Wo Contrast  Result Date: 01/22/2019 CLINICAL DATA:  Encephalopathy EXAM: CT HEAD WITHOUT CONTRAST TECHNIQUE: Contiguous axial images were obtained from the base of the skull through the vertex without intravenous contrast. COMPARISON:  None. FINDINGS: Brain: There is no mass, hemorrhage or extra-axial collection. There is generalized atrophy  without lobar predilection. Hypodensity of the white matter is most commonly associated with chronic microvascular disease. Vascular: No abnormal hyperdensity of the major intracranial arteries or dural venous sinuses. No intracranial atherosclerosis. Skull: Fluid levels in the frontal and maxillary sinuses. Moderate ethmoid sinus opacification. Sinuses/Orbits: No fluid levels or advanced mucosal thickening of the visualized paranasal sinuses. No mastoid or middle ear effusion. The orbits are normal. 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 Electronically Signed   By: Deatra RobinsonKevin  Herman M.D.   On: Nov 15, 2018 23:32   Ct Angio Chest Pe W/cm &/or Wo Cm  Result Date: 02/14/2019 CLINICAL DATA:  Shortness of breath and recent CPR EXAM: CT ANGIOGRAPHY CHEST WITH CONTRAST TECHNIQUE: Multidetector CT imaging of the chest was performed using the standard protocol during bolus administration of intravenous contrast. Multiplanar CT image reconstructions and MIPs were obtained to evaluate the vascular anatomy. CONTRAST:  75mL OMNIPAQUE IOHEXOL 350 MG/ML SOLN COMPARISON:  Chest x-ray from earlier in the same day. FINDINGS: Cardiovascular: Thoracic aorta demonstrates mild atherosclerotic calcifications. No aneurysmal dilatation or dissection is seen. Pulmonary artery is well visualized within normal branching pattern. Large bilateral pulmonary emboli are identified with near complete loss of pulmonary arterial flow on the right and significant decrease on the left. Changes consistent with right heart strain are noted. Mediastinum/Nodes: Thoracic inlet is within normal limits. No hilar or mediastinal adenopathy is noted. The esophagus as visualized is within normal limits. Endotracheal tube is noted in satisfactory position. Lungs/Pleura: Lungs are well aerated bilaterally. Patchy ground-glass infiltrate is noted in the left upper lobe in a perihilar distribution.  No other focal infiltrate is seen. No sizable effusion or pneumothorax is noted. Upper Abdomen: Visualized upper abdomen shows gastric lap band in satisfactory position. No other focal abnormality is noted. Musculoskeletal: Degenerative changes of the thoracic spine are seen. Some undisplaced rib fractures are noted anteriorly involving the fourth, fifth and sixth ribs on the right. Review of the MIP images confirms the above findings. IMPRESSION: Massive bilateral central pulmonary emboli with significant decrease in pulmonary arterial flow. Right heart strain is noted. Patchy ground-glass infiltrate in the left upper lobe. Patient has negative COVID-19 status and this may represent acute pneumonic infiltrate or sequelae from the known pulmonary emboli. Undisplaced rib fractures on the right as described without pneumothorax. No other focal abnormality is noted. Aortic Atherosclerosis (ICD10-I70.0). Critical Value/emergent results were called by telephone at the time of interpretation on 02/06/2019 at 11:33 pm to Dr. Madilyn Hookees, who verbally acknowledged  these results. Electronically Signed   By: Alcide Clever M.D.   On: 2019-02-10 23:41   Dg Chest Portable 1 View  Result Date: 02-10-2019 CLINICAL DATA:  Status post intubation, recent CPR EXAM: PORTABLE CHEST 1 VIEW COMPARISON:  Film from earlier in the same day. FINDINGS: Patchy opacity is again noted in the left mid lung stable from the prior exam. Lungs are otherwise clear. Endotracheal tube is noted in satisfactory position. A rounded metallic artifact is noted overlying the upper chest related to patient's hand. No other focal abnormality is noted. IMPRESSION: Stable opacity over the left lung. Endotracheal tube in satisfactory position. Extrinsic artifact over the upper chest. Electronically Signed   By: Alcide Clever M.D.   On: 02-10-2019 20:52   Dg Chest Portable 1 View  Result Date: 10-Feb-2019 CLINICAL DATA:  Shortness of breath increasing over the past few  hours EXAM: PORTABLE CHEST 1 VIEW COMPARISON:  01/27/2007 FINDINGS: Cardiac shadow is stable. Tortuosity of the thoracic aorta is again seen. Patchy left perihilar infiltrate is noted. No sizable effusion is seen. Old rib fractures on the right are seen. IMPRESSION: Patchy left perihilar infiltrate. Followup films following appropriate therapy are recommended. Electronically Signed   By: Alcide Clever M.D.   On: Feb 10, 2019 17:08    Labs:  CBC: Recent Labs    10-Feb-2019 1504  02-10-2019 2051 2019/02/10 2150 02/10/2019 2157 01/28/2019 0007  WBC 15.6*  --   --  27.4*  --   --   HGB 16.6   < > 15.6 14.7 15.6 14.6  HCT 52.0   < > 46.0 47.1 46.0 43.0  PLT 242  --   --  250  --   --    < > = values in this interval not displayed.    COAGS: Recent Labs    02-10-19 1504 01/31/2019 0029  INR 1.2 1.5*  APTT  --  50*    BMP: Recent Labs    02-10-2019 1504  2019-02-10 1507 02-10-2019 2051 02/10/2019 2150 10-Feb-2019 2157 01/20/2019 0007  NA 139   < >  --  141 142 140 138  K 3.7   < >  --  4.9 4.3 4.2 5.5*  CL 107  --   --   --  107  --   --   CO2 17*  --   --   --  20*  --   --   GLUCOSE 200*  --   --   --  242*  --   --   BUN 40*  --   --   --  42*  --   --   CALCIUM 9.6  --   --   --  7.9*  --   --   CREATININE 1.51*  --  1.30*  --  1.80*  --   --   GFRNONAA 47*  --   --   --  38*  --   --   GFRAA 55*  --   --   --  44*  --   --    < > = values in this interval not displayed.    LIVER FUNCTION TESTS: Recent Labs    2019-02-10 1504  BILITOT 0.4  AST 42*  ALT 39  ALKPHOS 91  PROT 8.1  ALBUMIN 4.0    TUMOR MARKERS: No results for input(s): AFPTM, CEA, CA199, CHROMGRNA in the last 8760 hours.  Assessment and Plan:  68 yo with massive pulmonary embolism and  status post systemic TPA.  Reportedly, patient had immediate improvement with thrombolysis but now he continues to be hypotensive with vasopressor support.  Reviewed Chest CTA and there is a large clot burden and significant right heart  strain.  Currently, patient is not stable enough to travel to IR but he would benefit for catheter-directed TPA in the near future if he can stabilize.  Discussed with Dr. Duwayne Heck of Critical Care.   IR will continue to follow.    Electronically Signed: Burman Riis, MD 02/11/2019, 1:49 AM

## 2019-01-24 NOTE — Sedation Documentation (Signed)
Procedure successful. Pt to be transferred to [facility].

## 2019-01-24 NOTE — Progress Notes (Signed)
2 amps bicarb given per Hayden Pedro verbal order.

## 2019-01-24 NOTE — Progress Notes (Signed)
Patient had grapefruit sized blood clot expel from mouth. Since admission 12/8 at 0000, patient has lost 300 mL of blood via mouth and nose. MD aware. Will continue to monitor.  Jackalyn Lombard

## 2019-01-24 NOTE — Progress Notes (Signed)
Pt started on NO per CCM MD request at 20ppm. Pt stable throughout with no complications. VS within normal limits.

## 2019-01-24 NOTE — Progress Notes (Signed)
Day of Surgery Procedure(s) (LRB): CANNULATION FOR VA ECMO (EXTRACORPOREAL MEMBRANE OXYGENATION) (N/A) TRANSESOPHAGEAL ECHOCARDIOGRAM (TEE) (N/A) Subjective: Tolerated transfer to interventional radiology for bilateral pulmonary artery catheter placement to infuse TPA 1 mg/h in each vessel for 12 hours  He remains on VA ECMO with moderate doses of epinephrine, norepinephrine, and vasopressin.  Urine output is 100 cc/h and mean arterial pressure 80.  Objective: Vital signs in last 24 hours: Temp:  [97.8 F (36.6 C)-101.5 F (38.6 C)] 101.5 F (38.6 C) (12/08 1200) Pulse Rate:  [54-114] 101 (12/08 1305) Cardiac Rhythm: Normal sinus rhythm (12/08 1200) Resp:  [12-39] 35 (12/08 1300) BP: (76-100)/(47-64) 94/61 (12/08 1200) SpO2:  [10 %-100 %] 100 % (12/08 1900) Arterial Line BP: (72-130)/(48-102) 96/83 (12/08 1910) FiO2 (%):  [40 %-100 %] 40 % (12/08 1105) Weight:  [144.2 kg-146.5 kg] 146.5 kg (12/08 1100)  Hemodynamic parameters for last 24 hours:  Sinus rhythm  Intake/Output from previous day: 12/07 0701 - 12/08 0700 In: 2343.4 [I.V.:1993.4; IV Piggyback:350] Out: 350 [Urine:350] Intake/Output this shift: No intake/output data recorded.  Sedated on ventilator ECMO cannulas secure No bleeding at the ECMO cannulation sites Right arm warm with Doppler pulse, baseline general edema which he had preop  Lab Results: Recent Labs    Jan 27, 2019 0454  2019/01/27 1657 27-Jan-2019 1736  WBC 26.5*  --   --  21.7*  HGB 13.9   < > 8.5* 8.5*  HCT 44.2   < > 25.0* 25.5*  PLT 246  --   --  152   < > = values in this interval not displayed.   BMET:  Recent Labs    02/04/2019 2150  01/27/2019 0454  2019-01-27 1353 01/27/2019 1640 2019/01/27 1657  NA 142   < > 144   < > 144 145 145  K 4.3   < > 3.4*   < > 3.2* 3.0* 2.9*  CL 107  --  104  --  105 101  --   CO2 20*  --  19*  --   --   --   --   GLUCOSE 242*  --  390*  --  424* 360*  --   BUN 42*  --  54*  --  49* 56*  --   CREATININE 1.80*  --   2.33*  --  2.20* 2.10*  --   CALCIUM 7.9*  --  7.6*  --   --   --   --    < > = values in this interval not displayed.    PT/INR:  Recent Labs    Jan 27, 2019 1736  LABPROT 22.9*  INR 2.0*   ABG    Component Value Date/Time   PHART 7.230 (L) 2019/01/27 1657   HCO3 26.3 01-27-2019 1657   TCO2 28 Jan 27, 2019 1657   ACIDBASEDEF 1.0 2019-01-27 1657   O2SAT 100.0 January 27, 2019 1657   CBG (last 3)  Recent Labs    27-Jan-2019 1025 January 27, 2019 1134 27-Jan-2019 1244  GLUCAP 447* 418* 402*    Assessment/Plan: S/P Procedure(s) (LRB): CANNULATION FOR VA ECMO (EXTRACORPOREAL MEMBRANE OXYGENATION) (N/A) TRANSESOPHAGEAL ECHOCARDIOGRAM (TEE) (N/A) Continue VA ECMO support Keep patient sedated Follow serial hemoglobin and lactate   LOS: 1 day    Philip Wolfe 2019/01/27

## 2019-01-24 NOTE — Significant Event (Signed)
Patient with significant clot burden. Increased pressor requirements now on 35 mcg/min EPI and 40 mcg/min Levophed. Discussed with Dr. Duwayne Heck requests re-dosing of TPA at half dose. Dicussed risks and benefits with wife who consents and agrees.

## 2019-01-24 NOTE — Progress Notes (Signed)
ANTICOAGULATION CONSULT NOTE  Pharmacy Consult for Heparin  Indication: pulmonary embolus  No Known Allergies  Patient Measurements: Height: 5\' 9"  (175.3 cm) Weight: (!) 317 lb 14.5 oz (144.2 kg) IBW/kg (Calculated) : 70.7 Heparin Dosing Weight: 103 kg  Vital Signs: Temp: 97.8 F (36.6 C) (12/07 2354) Temp Source: Axillary (12/07 2354) BP: 87/53 (12/07 2130) Pulse Rate: 82 (12/08 0007)  Labs: Recent Labs    02/07/2019 1500 01/31/2019 1504  01/29/2019 1507 01/17/2019 1657  01/28/2019 2150 01/29/2019 2157 02-22-19 0007 02-22-19 0029  HGB  --  16.6   < >  --   --    < > 14.7 15.6 14.6  --   HCT  --  52.0   < >  --   --    < > 47.1 46.0 43.0  --   PLT  --  242  --   --   --   --  250  --   --   --   APTT  --   --   --   --   --   --   --   --   --  50*  LABPROT  --  14.7  --   --   --   --   --   --   --  17.6*  INR  --  1.2  --   --   --   --   --   --   --  1.5*  CREATININE  --  1.51*  --  1.30*  --   --  1.80*  --   --   --   TROPONINIHS 35*  --   --   --  315*  --   --   --   --   --    < > = values in this interval not displayed.    Estimated Creatinine Clearance: 56.4 mL/min (A) (by C-G formula based on SCr of 1.8 mg/dL (H)).  Assessment: 68 y.o. male with massive PE s/p alteplase for heparin  Goal of Therapy:  Heparin level 0.3-0.5 units/ml (first 24 hours) then  Heparin level 0.3-0.7 units/ml Monitor platelets by anticoagulation protocol: Yes   Plan:  Start heparin 1600 units/hr Check heparin level in 6 hours.   Phillis Knack, PharmD, BCPS  2019-02-22,1:16 AM

## 2019-01-24 NOTE — Progress Notes (Signed)
eLink Physician-Brief Progress Note Patient Name: Philip Wolfe DOB: June 11, 1950 MRN: [id number]   Date of Service  01/29/2019  HPI/Events of Note  Hyperglycemia - Blood glucose = 280.   eICU Interventions  Will order: 1. Change to Q 4 hour sensitive Novolog SSI.      Intervention Category Major Interventions: Hyperglycemia - active titration of insulin therapy  Rosalin Buster Cornelia Copa 02/06/2019, 2:47 AM

## 2019-01-24 NOTE — Anesthesia Procedure Notes (Signed)
Central Venous Catheter Insertion Performed by: Albertha Ghee, MD, anesthesiologist Start/EndDec 09, 2020 1:40 PM, 2019-01-25 1:45 PM Patient location: Pre-op. Preanesthetic checklist: patient identified, IV checked, site marked, risks and benefits discussed, surgical consent, monitors and equipment checked, pre-op evaluation, timeout performed and anesthesia consent Patient sedated Hand hygiene performed  and maximum sterile barriers used  Catheter size: 8.5 Fr Sheath introducer Procedure performed using ultrasound guided technique. Ultrasound Notes:anatomy identified, needle tip was noted to be adjacent to the nerve/plexus identified, no ultrasound evidence of intravascular and/or intraneural injection and image(s) printed for medical record Attempts: 1 Following insertion, line sutured and dressing applied. Post procedure assessment: blood return through all ports, free fluid flow and no air  Patient tolerated the procedure well with no immediate complications.

## 2019-01-24 NOTE — Progress Notes (Signed)
ANTICOAGULATION CONSULT NOTE   Pharmacy Consult for Heparin  Indication: pulmonary embolus  No Known Allergies  Patient Measurements: Height: 5' 9.02" (175.3 cm) Weight: (!) 322 lb 15.6 oz (146.5 kg) IBW/kg (Calculated) : 70.74 Heparin Dosing Weight: 103 kg  Vital Signs: Temp: 101.5 F (38.6 C) (12/08 1200) BP: 94/61 (12/08 1200) Pulse Rate: 101 (12/08 1305)  Labs: Recent Labs    February 06, 2019 1500  02/06/2019 1657  February 06, 2019 2150  02/10/2019 0029  01/22/2019 0454  01/21/2019 1030 02/02/2019 1111  01/27/2019 1353 02/09/2019 1640 02/05/2019 1657 02/02/2019 1736  HGB  --    < >  --    < > 14.7   < >  --    < > 13.9   < >  --   --    < > 11.6* 8.2* 8.5* 8.5*  HCT  --    < >  --    < > 47.1   < >  --    < > 44.2   < >  --   --    < > 34.0* 24.0* 25.0* 25.5*  PLT  --    < >  --   --  250  --   --   --  246  --   --   --   --   --   --   --  152  APTT  --   --   --   --   --   --  50*  --   --   --   --   --   --   --   --   --  138*  LABPROT  --    < >  --   --   --   --  17.6*  --   --   --   --  19.2*  --   --   --   --  22.9*  INR  --    < >  --   --   --   --  1.5*  --   --   --   --  1.6*  --   --   --   --  2.0*  HEPARINUNFRC  --   --   --   --   --   --   --   --   --   --  0.64  --   --   --   --   --   --   CREATININE  --    < >  --   --  1.80*  --   --   --  2.33*  --   --   --   --  2.20* 2.10*  --   --   TROPONINIHS 35*  --  315*  --   --   --  884*  --   --   --   --   --   --   --   --   --   --    < > = values in this interval not displayed.    Estimated Creatinine Clearance: 48.8 mL/min (A) (by C-G formula based on SCr of 2.1 mg/dL (H)).  Assessment: 68 y.o. male who presented on 12/7 with hypoxic respiratory failure and was found to have a B/L PE. The patient received a total of alteplase 100 mg IV while in the ED (50 mg IV bolus then 50 mg IV  over an hour) with an additional half bolus of 50 mg given by CCM on 12/8 AM. The patient then went for ECMO 12/8 afternoon followed by  EKOS in IR with bilateral alteplase infusions running for PE lysis. Pharmacy remains consulted for heparin management.    The patient's initial heparin level was 0.64 prior to ECMO with an aPTT check in IR that was elevated at 138 - the heparin drip rate was reduced to 1400 units/hr while in IR. Will follow with the 6 hour post-IR heparin/fibrinogen check.  Goal of Therapy:  Heparin level 0.3-0.5 units/ml (while on ECMO) Monitor platelets by anticoagulation protocol: Yes    Plan:  - Continue Heparin at 1400 units/hr (14 ml/hr) - Will aim for heparin level goal of 0.3-0.5 while on ECMO - Will monitor q6h HL, fibrinogen levels, CBC while on EKOS - Will continue to monitor for any signs/symptoms of bleeding and will follow up with heparin level 6 hours s/p IR  Thank you for allowing pharmacy to be a part of this patient's care.  Georgina Pillion, PharmD, BCPS Clinical Pharmacist Clinical phone for 02/14/2019: [phone] 01/30/2019 8:42 PM   **Pharmacist phone directory can now be found on [email or web address] ([email or web address]).  Listed under [facility].

## 2019-01-24 NOTE — Progress Notes (Signed)
IR asked to revisit pt for consideration of catheter directed PE lysis. Dr. Tamala Julian has been in conversation with Dr. Pascal Lux throughout the day.  Plan is currently for cardiology to proceed with ECMO placement in hopes to stabilize pt enough that he can come to IR for PE lysis. BP 94/61   Pulse (!) 101   Temp (!) 101.5 F (38.6 C)   Resp (!) 35   Ht 5' 9.02" (1.753 m)   Wt (!) 146.5 kg   SpO2 100%   BMI 47.67 kg/m  Intubated/sedated.  Discussed with wife at bedside intent of catheter directed thrombolysis. Explained procedure in detail. Risks and benefits discussed with the patient including, but not limited to bleeding, possible life threatening bleeding and need for blood product transfusion, vascular injury, stroke, contrast induced renal failure.  All of the patient's questions were answered, patient is agreeable to proceed. Consent signed and in chart.  Philip Dike PA-C Interventional Radiology 01/17/2019 12:11 PM

## 2019-01-24 NOTE — Progress Notes (Signed)
Inpatient Diabetes Program Recommendations  AACE/ADA: New Consensus Statement on Inpatient Glycemic Control   Target Ranges:  Prepandial:   less than 140 mg/dL      Peak postprandial:   less than 180 mg/dL (1-2 hours)      Critically ill patients:  140 - 180 mg/dL   Results for DACIAN, ORRICO (MRN [id number]) as of 02/08/2019 10:04  Ref. Range 01/21/2019 02:26 01/25/2019 05:10 02/14/2019 07:34 01/30/2019 09:09  Glucose-Capillary Latest Ref Range: 70 - 99 mg/dL 280 (H) 334 (H) 419 (H) 436 (H)  Results for MONTRELL, CESSNA (MRN [id number]) as of 02/10/2019 10:04  Ref. Range 01/31/2019 04:54 02/06/2019 07:50  Potassium Latest Ref Range: 3.5 - 5.1 mmol/L 3.4 (L) 3.6   Review of Glycemic Control  Diabetes history: Prediabetes Outpatient Diabetes medications: None Current orders for Inpatient glycemic control: IV insulin  Inpatient Diabetes Program Recommendations:   Insulin - IV drip/EndoTool: Noted IV insulin per EndoTool just ordered today as glucose up to 436 mg/dl today at 9:09 am.  HgbA1C: A1C 6.3% on 01/18/2019 indicating an average glucose of 134 mg/dl over the past 2-3 months. Per chart, patient has a hx of prediabetes.  NOTE: Noted consult for Diabetes Coordinator as patient was started on IV insulin per EndoTool Hyperglycemia Crisis.  Patient is currently on ventilator and admitted with massive pulmonary emboli with acute cor pulmonale. Patient received Solumedrol 125 mg on 01/18/2019 at 17:52 which is contributing to hyperglycemia. IV insulin was started at 9:10 am today. Noted K+ 3.6 mmol/L at 7:50 am today. Anticipate K+ will drop as IV insulin will shift serum K+. Sent secure chat to K. Redmond Pulling, RN to ask that she let MD know current K+ and ask about potassium replacement. Will continue to follow along.   Thanks, Barnie Alderman, RN, MSN, CDE Diabetes Coordinator Inpatient Diabetes Program [phone] (Team Pager from 8am to 5pm)

## 2019-01-24 NOTE — Code Documentation (Signed)
50 of TPA given over an hr.

## 2019-01-24 NOTE — Procedures (Signed)
S/p bilateral PE infusion cath insertions for PE lysis  No comp EBL min Full report in pacs

## 2019-01-25 ENCOUNTER — Inpatient Hospital Stay (HOSPITAL_COMMUNITY): Payer: BC Managed Care – PPO

## 2019-01-25 ENCOUNTER — Encounter (HOSPITAL_COMMUNITY): Payer: Self-pay | Admitting: Cardiothoracic Surgery

## 2019-01-25 DIAGNOSIS — I2699 Other pulmonary embolism without acute cor pulmonale: Secondary | ICD-10-CM

## 2019-01-25 DIAGNOSIS — R570 Cardiogenic shock: Secondary | ICD-10-CM

## 2019-01-25 HISTORY — PX: IR THROMB F/U EVAL ART/VEN FINAL DAY (MS): IMG5379

## 2019-01-25 LAB — CBC
HCT: 22.3 % — ABNORMAL LOW (ref 39.0–52.0)
HCT: 25.9 % — ABNORMAL LOW (ref 39.0–52.0)
HCT: 25.6 % — ABNORMAL LOW (ref 39.0–52.0)
HCT: 24.5 % — ABNORMAL LOW (ref 39.0–52.0)
HCT: 24.1 % — ABNORMAL LOW (ref 39.0–52.0)
Hemoglobin: 7.5 g/dL — ABNORMAL LOW (ref 13.0–17.0)
Hemoglobin: 9.1 g/dL — ABNORMAL LOW (ref 13.0–17.0)
Hemoglobin: 9 g/dL — ABNORMAL LOW (ref 13.0–17.0)
Hemoglobin: 8.4 g/dL — ABNORMAL LOW (ref 13.0–17.0)
Hemoglobin: 8.5 g/dL — ABNORMAL LOW (ref 13.0–17.0)
MCH: 30.5 pg (ref 26.0–34.0)
MCH: 30.5 pg (ref 26.0–34.0)
MCH: 30.8 pg (ref 26.0–34.0)
MCH: 30.8 pg (ref 26.0–34.0)
MCH: 30.7 pg (ref 26.0–34.0)
MCHC: 33.6 g/dL (ref 30.0–36.0)
MCHC: 35.1 g/dL (ref 30.0–36.0)
MCHC: 35.2 g/dL (ref 30.0–36.0)
MCHC: 34.3 g/dL (ref 30.0–36.0)
MCHC: 35.3 g/dL (ref 30.0–36.0)
MCV: 90.7 fL (ref 80.0–100.0)
MCV: 86.9 fL (ref 80.0–100.0)
MCV: 87.7 fL (ref 80.0–100.0)
MCV: 89.7 fL (ref 80.0–100.0)
MCV: 87 fL (ref 80.0–100.0)
Platelets: 105 10*3/uL — ABNORMAL LOW (ref 150–400)
Platelets: 70 10*3/uL — ABNORMAL LOW (ref 150–400)
Platelets: 73 10*3/uL — ABNORMAL LOW (ref 150–400)
Platelets: 74 10*3/uL — ABNORMAL LOW (ref 150–400)
Platelets: 72 10*3/uL — ABNORMAL LOW (ref 150–400)
RBC: 2.46 MIL/uL — ABNORMAL LOW (ref 4.22–5.81)
RBC: 2.98 MIL/uL — ABNORMAL LOW (ref 4.22–5.81)
RBC: 2.92 MIL/uL — ABNORMAL LOW (ref 4.22–5.81)
RBC: 2.73 MIL/uL — ABNORMAL LOW (ref 4.22–5.81)
RBC: 2.77 MIL/uL — ABNORMAL LOW (ref 4.22–5.81)
RDW: 13.3 % (ref 11.5–15.5)
RDW: 13.6 % (ref 11.5–15.5)
RDW: 14.1 % (ref 11.5–15.5)
RDW: 14.6 % (ref 11.5–15.5)
RDW: 13.7 % (ref 11.5–15.5)
WBC: 27.7 10*3/uL — ABNORMAL HIGH (ref 4.0–10.5)
WBC: 27.1 10*3/uL — ABNORMAL HIGH (ref 4.0–10.5)
WBC: 34.1 10*3/uL — ABNORMAL HIGH (ref 4.0–10.5)
WBC: 34.9 10*3/uL — ABNORMAL HIGH (ref 4.0–10.5)
WBC: 35.9 10*3/uL — ABNORMAL HIGH (ref 4.0–10.5)
nRBC: 0.2 % (ref 0.0–0.2)
nRBC: 0.3 % — ABNORMAL HIGH (ref 0.0–0.2)
nRBC: 0.3 % — ABNORMAL HIGH (ref 0.0–0.2)
nRBC: 0.4 % — ABNORMAL HIGH (ref 0.0–0.2)
nRBC: 0.5 % — ABNORMAL HIGH (ref 0.0–0.2)

## 2019-01-25 LAB — PREPARE CRYOPRECIPITATE: Unit division: 0

## 2019-01-25 LAB — BPAM FFP
Blood Product Expiration Date: 202012092359
Blood Product Expiration Date: 202012092359
ISSUE DATE / TIME: 202012081537
ISSUE DATE / TIME: 202012081537
Unit Type and Rh: 6200
Unit Type and Rh: 6200

## 2019-01-25 LAB — POCT I-STAT 7, (LYTES, BLD GAS, ICA,H+H)
Acid-Base Excess: 3 mmol/L — ABNORMAL HIGH (ref 0.0–2.0)
Acid-Base Excess: 2 mmol/L (ref 0.0–2.0)
Acid-Base Excess: 4 mmol/L — ABNORMAL HIGH (ref 0.0–2.0)
Bicarbonate: 27.3 mmol/L (ref 20.0–28.0)
Bicarbonate: 26.7 mmol/L (ref 20.0–28.0)
Bicarbonate: 28.3 mmol/L — ABNORMAL HIGH (ref 20.0–28.0)
Calcium, Ion: 0.96 mmol/L — ABNORMAL LOW (ref 1.15–1.40)
Calcium, Ion: 0.96 mmol/L — ABNORMAL LOW (ref 1.15–1.40)
Calcium, Ion: 0.94 mmol/L — ABNORMAL LOW (ref 1.15–1.40)
HCT: 22 % — ABNORMAL LOW (ref 39.0–52.0)
HCT: 26 % — ABNORMAL LOW (ref 39.0–52.0)
HCT: 23 % — ABNORMAL LOW (ref 39.0–52.0)
Hemoglobin: 7.5 g/dL — ABNORMAL LOW (ref 13.0–17.0)
Hemoglobin: 8.8 g/dL — ABNORMAL LOW (ref 13.0–17.0)
Hemoglobin: 7.8 g/dL — ABNORMAL LOW (ref 13.0–17.0)
O2 Saturation: 100 %
O2 Saturation: 100 %
O2 Saturation: 100 %
Patient temperature: 37.6
Patient temperature: 37.7
Patient temperature: 37.8
Potassium: 3.5 mmol/L (ref 3.5–5.1)
Potassium: 3.5 mmol/L (ref 3.5–5.1)
Potassium: 3.8 mmol/L (ref 3.5–5.1)
Sodium: 141 mmol/L (ref 135–145)
Sodium: 142 mmol/L (ref 135–145)
Sodium: 143 mmol/L (ref 135–145)
TCO2: 28 mmol/L (ref 22–32)
TCO2: 28 mmol/L (ref 22–32)
TCO2: 30 mmol/L (ref 22–32)
pCO2 arterial: 38.3 mmHg (ref 32.0–48.0)
pCO2 arterial: 43.4 mmHg (ref 32.0–48.0)
pCO2 arterial: 44.2 mmHg (ref 32.0–48.0)
pH, Arterial: 7.463 — ABNORMAL HIGH (ref 7.350–7.450)
pH, Arterial: 7.4 (ref 7.350–7.450)
pH, Arterial: 7.418 (ref 7.350–7.450)
pO2, Arterial: 397 mmHg — ABNORMAL HIGH (ref 83.0–108.0)
pO2, Arterial: 360 mmHg — ABNORMAL HIGH (ref 83.0–108.0)
pO2, Arterial: 263 mmHg — ABNORMAL HIGH (ref 83.0–108.0)

## 2019-01-25 LAB — COOXEMETRY PANEL
Carboxyhemoglobin: 1.7 % — ABNORMAL HIGH (ref 0.5–1.5)
Carboxyhemoglobin: 2 % — ABNORMAL HIGH (ref 0.5–1.5)
Methemoglobin: 1.7 % — ABNORMAL HIGH (ref 0.0–1.5)
Methemoglobin: 1.5 % (ref 0.0–1.5)
O2 Saturation: 64.8 %
O2 Saturation: 70.6 %
Total hemoglobin: 8.2 g/dL — ABNORMAL LOW (ref 12.0–16.0)
Total hemoglobin: 8.8 g/dL — ABNORMAL LOW (ref 12.0–16.0)

## 2019-01-25 LAB — GLUCOSE, CAPILLARY
Glucose-Capillary: 124 mg/dL — ABNORMAL HIGH (ref 70–99)
Glucose-Capillary: 149 mg/dL — ABNORMAL HIGH (ref 70–99)
Glucose-Capillary: 151 mg/dL — ABNORMAL HIGH (ref 70–99)
Glucose-Capillary: 153 mg/dL — ABNORMAL HIGH (ref 70–99)
Glucose-Capillary: 155 mg/dL — ABNORMAL HIGH (ref 70–99)
Glucose-Capillary: 160 mg/dL — ABNORMAL HIGH (ref 70–99)
Glucose-Capillary: 160 mg/dL — ABNORMAL HIGH (ref 70–99)
Glucose-Capillary: 163 mg/dL — ABNORMAL HIGH (ref 70–99)
Glucose-Capillary: 163 mg/dL — ABNORMAL HIGH (ref 70–99)
Glucose-Capillary: 167 mg/dL — ABNORMAL HIGH (ref 70–99)
Glucose-Capillary: 169 mg/dL — ABNORMAL HIGH (ref 70–99)
Glucose-Capillary: 180 mg/dL — ABNORMAL HIGH (ref 70–99)
Glucose-Capillary: 182 mg/dL — ABNORMAL HIGH (ref 70–99)
Glucose-Capillary: 195 mg/dL — ABNORMAL HIGH (ref 70–99)
Glucose-Capillary: 197 mg/dL — ABNORMAL HIGH (ref 70–99)
Glucose-Capillary: 200 mg/dL — ABNORMAL HIGH (ref 70–99)
Glucose-Capillary: 201 mg/dL — ABNORMAL HIGH (ref 70–99)
Glucose-Capillary: 202 mg/dL — ABNORMAL HIGH (ref 70–99)
Glucose-Capillary: 206 mg/dL — ABNORMAL HIGH (ref 70–99)
Glucose-Capillary: 215 mg/dL — ABNORMAL HIGH (ref 70–99)
Glucose-Capillary: 219 mg/dL — ABNORMAL HIGH (ref 70–99)
Glucose-Capillary: 226 mg/dL — ABNORMAL HIGH (ref 70–99)
Glucose-Capillary: 229 mg/dL — ABNORMAL HIGH (ref 70–99)

## 2019-01-25 LAB — LACTATE DEHYDROGENASE
LDH: 293 U/L — ABNORMAL HIGH (ref 98–192)
LDH: 335 U/L — ABNORMAL HIGH (ref 98–192)

## 2019-01-25 LAB — ECHOCARDIOGRAM LIMITED
Height: 69.016 in
Weight: 5227.55 oz

## 2019-01-25 LAB — HEPARIN LEVEL (UNFRACTIONATED)
Heparin Unfractionated: <0.1 IU/mL — ABNORMAL LOW (ref 0.30–0.70)
Heparin Unfractionated: 0.42 IU/mL (ref 0.30–0.70)
Heparin Unfractionated: 0.33 IU/mL (ref 0.30–0.70)

## 2019-01-25 LAB — APTT
aPTT: 42 seconds — ABNORMAL HIGH (ref 24–36)
aPTT: 93 seconds — ABNORMAL HIGH (ref 24–36)
aPTT: 115 seconds — ABNORMAL HIGH (ref 24–36)

## 2019-01-25 LAB — COMPREHENSIVE METABOLIC PANEL
ALT: 33 U/L (ref 0–44)
AST: 50 U/L — ABNORMAL HIGH (ref 15–41)
Albumin: 2.2 g/dL — ABNORMAL LOW (ref 3.5–5.0)
Alkaline Phosphatase: 34 U/L — ABNORMAL LOW (ref 38–126)
Anion gap: 11 (ref 5–15)
BUN: 45 mg/dL — ABNORMAL HIGH (ref 8–23)
CO2: 25 mmol/L (ref 22–32)
Calcium: 6.6 mg/dL — ABNORMAL LOW (ref 8.9–10.3)
Chloride: 106 mmol/L (ref 98–111)
Creatinine, Ser: 1.52 mg/dL — ABNORMAL HIGH (ref 0.61–1.24)
GFR calc Af Amer: 54 mL/min — ABNORMAL LOW (ref >60)
GFR calc non Af Amer: 47 mL/min — ABNORMAL LOW (ref >60)
Glucose, Bld: 204 mg/dL — ABNORMAL HIGH (ref 70–99)
Potassium: 3.7 mmol/L (ref 3.5–5.1)
Sodium: 142 mmol/L (ref 135–145)
Total Bilirubin: 1 mg/dL (ref 0.3–1.2)
Total Protein: 3.9 g/dL — ABNORMAL LOW (ref 6.5–8.1)

## 2019-01-25 LAB — PROTIME-INR
INR: 2.4 — ABNORMAL HIGH (ref 0.8–1.2)
Prothrombin Time: 26.4 seconds — ABNORMAL HIGH (ref 11.4–15.2)

## 2019-01-25 LAB — PREPARE RBC (CROSSMATCH)

## 2019-01-25 LAB — FIBRINOGEN
Fibrinogen: 180 mg/dL — ABNORMAL LOW (ref 210–475)
Fibrinogen: 224 mg/dL (ref 210–475)

## 2019-01-25 LAB — PREPARE FRESH FROZEN PLASMA
Unit division: 0
Unit division: 0

## 2019-01-25 LAB — LACTIC ACID, PLASMA
Lactic Acid, Venous: 4 mmol/L (ref 0.5–1.9)
Lactic Acid, Venous: 4.6 mmol/L (ref 0.5–1.9)
Lactic Acid, Venous: 3.9 mmol/L (ref 0.5–1.9)
Lactic Acid, Venous: 3.6 mmol/L (ref 0.5–1.9)

## 2019-01-25 LAB — BASIC METABOLIC PANEL
Anion gap: 10 (ref 5–15)
BUN: 55 mg/dL — ABNORMAL HIGH (ref 8–23)
CO2: 25 mmol/L (ref 22–32)
Calcium: 7 mg/dL — ABNORMAL LOW (ref 8.9–10.3)
Chloride: 107 mmol/L (ref 98–111)
Creatinine, Ser: 1.91 mg/dL — ABNORMAL HIGH (ref 0.61–1.24)
GFR calc Af Amer: 41 mL/min — ABNORMAL LOW (ref >60)
GFR calc non Af Amer: 35 mL/min — ABNORMAL LOW (ref >60)
Glucose, Bld: 255 mg/dL — ABNORMAL HIGH (ref 70–99)
Potassium: 3.3 mmol/L — ABNORMAL LOW (ref 3.5–5.1)
Sodium: 142 mmol/L (ref 135–145)

## 2019-01-25 LAB — BPAM CRYOPRECIPITATE
Blood Product Expiration Date: 202012090055
ISSUE DATE / TIME: 202012082248
Unit Type and Rh: 6200

## 2019-01-25 LAB — MAGNESIUM
Magnesium: 1.5 mg/dL — ABNORMAL LOW (ref 1.7–2.4)
Magnesium: 1.5 mg/dL — ABNORMAL LOW (ref 1.7–2.4)

## 2019-01-25 MED ORDER — SODIUM CHLORIDE 0.9% IV SOLUTION: Freq: Once | INTRAVENOUS | Status: DC

## 2019-01-25 MED ORDER — POTASSIUM CHLORIDE 10 MEQ/50ML IV SOLN
10.0000 meq | Freq: Once | INTRAVENOUS | Status: AC
  Administered 2019-01-25: 10 meq via INTRAVENOUS

## 2019-01-25 MED ORDER — SODIUM CHLORIDE 0.9 % IV SOLN
2.0000 g | Freq: Two times a day (BID) | INTRAVENOUS | Status: DC
  Administered 2019-01-25 (×2): 2 g via INTRAVENOUS
  Filled 2019-01-25 (×5): qty 2

## 2019-01-25 MED ORDER — HEPARIN (PORCINE) 25000 UT/250ML-% IV SOLN
1900.0000 [IU]/h | INTRAVENOUS | Status: DC
  Administered 2019-01-25: 1200 [IU]/h via INTRAVENOUS
  Administered 2019-01-25 – 2019-01-28 (×5): 1500 [IU]/h via INTRAVENOUS
  Administered 2019-01-29: 1400 [IU]/h via INTRAVENOUS
  Administered 2019-01-29 – 2019-01-31 (×3): 1450 [IU]/h via INTRAVENOUS
  Administered 2019-02-01: 1500 [IU]/h via INTRAVENOUS
  Administered 2019-02-02: 1600 [IU]/h via INTRAVENOUS
  Administered 2019-02-03: 1800 [IU]/h via INTRAVENOUS
  Filled 2019-01-25 (×11): qty 250

## 2019-01-25 MED ORDER — POTASSIUM CHLORIDE 10 MEQ/50ML IV SOLN
10.0000 meq | Freq: Once | INTRAVENOUS | Status: AC
  Administered 2019-01-25: 10 meq via INTRAVENOUS
  Filled 2019-01-25: qty 50

## 2019-01-25 MED ORDER — CHLORHEXIDINE GLUCONATE 0.12% ORAL RINSE (MEDLINE KIT)
15.0000 mL | Freq: Two times a day (BID) | OROMUCOSAL | Status: DC
  Administered 2019-01-25 – 2019-02-03 (×19): 15 mL via OROMUCOSAL

## 2019-01-25 MED ORDER — PANTOPRAZOLE SODIUM 40 MG IV SOLR
40.0000 mg | INTRAVENOUS | Status: DC
  Administered 2019-01-25 – 2019-01-26 (×2): 40 mg via INTRAVENOUS
  Filled 2019-01-25 (×2): qty 40

## 2019-01-25 MED ORDER — SODIUM CHLORIDE 0.9% IV SOLUTION
Freq: Once | INTRAVENOUS | Status: AC
  Administered 2019-01-25: 03:00:00 via INTRAVENOUS

## 2019-01-25 MED ORDER — POTASSIUM CHLORIDE 10 MEQ/50ML IV SOLN
10.0000 meq | INTRAVENOUS | Status: AC
  Administered 2019-01-25 (×3): 10 meq via INTRAVENOUS

## 2019-01-25 MED ORDER — ORAL CARE MOUTH RINSE
15.0000 mL | OROMUCOSAL | Status: DC
  Administered 2019-01-25 – 2019-02-03 (×91): 15 mL via OROMUCOSAL

## 2019-01-25 NOTE — Progress Notes (Signed)
NAME:  Philip Wolfe, MRN:  [id number], DOB:  03/08/1950, LOS: 2 ADMISSION DATE:  02/04/2019, CONSULTATION DATE:  02/10/2019 REFERRING MD:  Dr. Madilyn Hook EDP, CHIEF COMPLAINT:  Hypoxia   Brief History   68 year old male admitted 12/7 with massive PE s/p thrombolytics.  History of present illness   68 year old male with past medical history as below, which is significant for hypertension, obstructive sleep apnea on CPAP, and prediabetes.  Of note he has presented 3 times recently to his PCP for Covid testing with concerns of exposures.  Most recently on November 22.  He presented to [facility] on 12/7 with complaints of acute onset shortness of breath.  He reports the symptoms have been intermittent and progressive over the past few weeks, but became very severe in the several hours leading to his presentation.  Upon arrival to the emergency department his oxygen saturations were in the 60s on a nonrebreather.  There was concern for coronavirus due to his relative comfort despite O2 sats in the 60s.  His POC COVID test came back negative and he was started on BiPAP with some improvement in the symptoms and oxygen saturations to the low 90s. CXR was relatively clear considering his symptoms and there was concern for PE. CT angiogram was ordered and PCCM was asked to evaluate for admission.   On evaluation in ED patient in respiratory distress on BiPAP with oxygen saturation 72-80%. Intubated by PCCM. Oxygen saturation 30-60% while Bagging with PEEP at 20. Progressively hypoxic, coded for about 5 minutes. Given TPA 100mg  x 1 then 50mg  x 1 6 hours later.  Cannulated for VA ECMO 02/10/2019 and underwent EKOS directed catheter placement.  Past Medical History   has a past medical history of Arthritis, Hypertension, Sleep apnea, Ulcer (left medial ankle), and Venous stasis ulcer (HCC).  Interim history/subjective:  On ECMO Flows okay Pven a little low Ongoing naso-oropharyngeal bleeding, ENT packed both  nostrils this AM with merosel.  Objective   Blood pressure 98/75, pulse 86, temperature 99.9 F (37.7 C), resp. rate 11, height 5' 9.02" (1.753 m), weight (!) 148.2 kg, SpO2 100 %. CVP:  [0 mmHg-7 mmHg] 7 mmHg  Vent Mode: PRVC FiO2 (%):  [40 %-100 %] 40 % Set Rate:  [10 bmp-35 bmp] 10 bmp Vt Set:  [450 mL-600 mL] 450 mL PEEP:  [5 cmH20-8 cmH20] 5 cmH20 Pressure Support:  [10 cmH20] 10 cmH20 Plateau Pressure:  [15 cmH20-20 cmH20] 15 cmH20   Intake/Output Summary (Last 24 hours) at 01/25/2019 1044 Last data filed at 01/25/2019 1000 Gross per 24 hour  Intake 7983.91 ml  Output 3485 ml  Net 4498.91 ml   Filed Weights   01/29/2019 0500 01/25/2019 1100 01/25/19 0500  Weight: (!) 146.5 kg (!) 146.5 kg (!) 148.2 kg    Examination: GEN: obese man in bed heavily sedated HEENT: bilateral nares packed, small amount BRB in mouth, trachea midlien, ETT in place with nonbloody secretions CV: Distant, RRR, ext lukewarm PULM: Diminished bases, no wheezing GI: Soft, hypoactive BS EXT: Chronic lymphedema, R arm swelling, dopplerable pusles in all 4 ext NEURO: heavily sedated PSYCH: Sedated SKIN: Pale, venous stasis changes of extremities R axillary arterial catheter, R femoral venous catheter, RIJ introducer, LIJ MML, L radial artery  H/H okay, 4 units yesterday Plts a bit low Fibrinogen good at 224 Lactate 3.9 from 4.6 ABG looks good Lung clear on CXR  Resolved Hospital Problem list     Assessment & Plan:  #  Acute cor pulmonale due to massive PE- s/p 150mg  total tPA during 1st day of hospitalization and another ~12mg  via catheter-directed therapy.  S/P VA ECMO axillary-femoral cannulation. # Obesity, OSA on CPAP # Reactive severe hyperglycemia # Reactive leukocytosis, PCT neg # Developing pulmonary infarcts # Acute kidney injury with acidosis- improved # Acute hypoxemic and hypercarbic respiratory failure- latest ABG shows ventilatory insufficiency despite high MV indicating huge dead  space from PE # R nares epistaxis- improved after packing by ENT this AM  -Continue vent support with lung protective TV 6cc/kg -Adjust sweep for pH 7.2-7.4 -Close UoP monitoring -Ceftriaxone reasonable I guess, would just do 7 days -Do not think further lytics are needed, would give RV time to hopefully recover function from stunning, limited echo pending; salvage therapy would be attempted surgical thrombectomy -Insulin gtt -Pressor/AC management per primary -Keep nasal packing in place, appreciate Dr. Deeann Saint help -Need to address PO access vs. TPN, maybe try another OG tomorrow?  31 minutes critical care time not including any separately billable procedures

## 2019-01-25 NOTE — Progress Notes (Addendum)
Nutrition Follow up  DOCUMENTATION CODES:   Morbid obesity  INTERVENTION:   Once access is obtained:  -Vital 1.5 @ 40 ml/hr (960 ml) -60 ml Prostat TID  Provides: 2040 kcals, 155 grams protein, 733 ml free water. Meets 100% of needs.   NUTRITION DIAGNOSIS:   Inadequate oral intake related to inability to eat as evidenced by NPO status.  Ongoing  GOAL:   Patient will meet greater than or equal to 90% of their needs   Not meeting   MONITOR:   Vent status, Labs, Weight trends, TF tolerance, Skin, I & O's  REASON FOR ASSESSMENT:   Ventilator    ASSESSMENT:   68 y.o. male with PMH of Arthritis, Hypertension, Sleep apnea, s/p gastric bypass. Presented to ED with progressive SOB. O2 sats in 60s, in respiratory distress on BiPAP in ED, intubated by PCCM. On CT pulmonary emboli identified, and right heart strain noted. Pt has passed several large blood clots from nose and mouth, has significant clot burden per NP.   12/8 s/p IR EKOS cath placement, cannulation for VA ECMO  Pt discussed during ICU rounds and with RN.   On ECMO. Requiring pressors- weaning epi. ENT following for epistaxis. Spoke with RN, unable to place small bore feeding tube until Monday due to nasal packing in both nares. OGT misplaced and removed this am (Of note pt with history of gastric bypass). Consider fluoroscopy bedside placement. If unable to pass OGT recommend initiation of TPN until small bore can placed next week.   Admission weight: 137 kg  Current weight: 148.7 kg   Patient is currently intubated on ventilator support MV: 5.2 L/min Temp (24hrs), Avg:99.6 F (37.6 C), Min:98.2 F (36.8 C), Max:100 F (37.8 C)    I/O: +8,285 ml since admit UOP: 1,465 ml x 24 hrs  ECMO: 1,100 ml x 24 hrs    Drips: precedex, epinephrine, insulin, levophed, sodium bicarb, vasopressin Medications: dulcolax, colace, 10 mg reglan QID Labs: Mg 1.5 (L) Cr 1.91- trending down CBG 200-229   Diet Order:    Diet Order            Diet NPO time specified  Diet effective now              EDUCATION NEEDS:   Not appropriate for education at this time  Skin:  Skin Assessment: Reviewed RN Assessment  Last BM:  PTA  Height:   Ht Readings from Last 1 Encounters:  01/22/2019 5' 9.02" (1.753 m)    Weight:   Wt Readings from Last 1 Encounters:  01/25/19 (!) 148.2 kg    Ideal Body Weight:  72.7 kg  BMI:  Body mass index is 48.23 kg/m.  Estimated Nutritional Needs:   Kcal:  0175-1025  Protein:  145-182 grams  Fluid:  >/= 1.6 L   Mariana Single RD, LDN Clinical Nutrition Pager # [phone]

## 2019-01-25 NOTE — Consult Note (Signed)
Reason for Consult: Epistaxis Referring Physician: Ina Homes, MD  HPI:  Philip Wolfe is an 68 y.o. male who was admitted after a large bilateral PE. He has a history of morbid obesity s/p gastric bypass, OSA on CPAP, and HTN. He was also found to have cardiogenic shock from right ventricular dysfunction. He was treated with TPA. He is POD #1 from ECMO cannulation. The patient started experiencing bleeding from his nose and throat last night. A rhinorocket was placed in the right nasal cavity. He continues to have persistent bleeding into his pharynx. ENT is consulted for control of his bleeding.   Past Medical History:  Diagnosis Date  . Arthritis   . Hypertension   . Sleep apnea   . Ulcer left medial ankle  . Venous stasis ulcer ([place])     Past Surgical History:  Procedure Laterality Date  . ENDOVENOUS ABLATION SAPHENOUS VEIN W/ LASER  11-05-2010  Left Greater Saphenous Vein  . GASTRIC BYPASS    . IR ANGIOGRAM PULMONARY BILATERAL SELECTIVE  02/11/2019  . IR ANGIOGRAM SELECTIVE EACH ADDITIONAL VESSEL  01/20/2019  . IR ANGIOGRAM SELECTIVE EACH ADDITIONAL VESSEL  02/09/2019  . IR INFUSION THROMBOL ARTERIAL INITIAL (MS)  02/10/2019  . IR INFUSION THROMBOL ARTERIAL INITIAL (MS)  01/28/2019  . varicose vein strippin g      Family History  Problem Relation Age of Onset  . Hypertension Mother     Social History:  reports that he has never smoked. He has never used smokeless tobacco. He reports that he does not drink alcohol or use drugs.  Allergies: No Known Allergies  Prior to Admission medications   Medication Sig Start Date End Date Taking? Authorizing Provider  amLODipine (NORVASC) 10 MG tablet Take 10 mg by mouth daily.      [provider]  fish oil-omega-3 fatty acids 1000 MG capsule Take 1 g by mouth daily.      [provider]  losartan (COZAAR) 100 MG tablet Take 100 mg by mouth daily.      [provider]  Multiple Vitamin (MULTIVITAMIN) tablet  Take 1 tablet by mouth daily.      [provider]  mupirocin (BACTROBAN) 2 % ointment Apply topically 3 (three) times daily.      [provider]  nebivolol (BYSTOLIC) 10 MG tablet Take 10 mg by mouth daily.      [provider]  spironolactone (ALDACTONE) 50 MG tablet Take 50 mg by mouth daily.      [provider]    Medications:  I have reviewed the patient's current medications. Scheduled: . sodium chloride   Intravenous Once  . acetaminophen  1,000 mg Oral Q6H   Or  . acetaminophen (TYLENOL) oral liquid 160 mg/5 mL  1,000 mg Per Tube Q6H  . aspirin EC  81 mg Oral Daily   Or  . aspirin  324 mg Per Tube Daily  . bisacodyl  10 mg Oral Daily   Or  . bisacodyl  10 mg Rectal Daily  . chlorhexidine gluconate (MEDLINE KIT)  15 mL Mouth Rinse BID  . Chlorhexidine Gluconate Cloth  6 each Topical Daily  . docusate sodium  200 mg Oral Daily  . mouth rinse  15 mL Mouth Rinse 10 times per day  . metoCLOPramide (REGLAN) injection  10 mg Intravenous Q6H  . sodium chloride flush  3 mL Intravenous Q12H  . sodium chloride flush  3 mL Intravenous Q12H   Continuous: . sodium  chloride    . sodium chloride    . sodium chloride 20.8 mL/hr at 01/25/19 0840  . sodium chloride 20.8 mL/hr at 01/25/19 0840  . sodium chloride    . sodium chloride    . albumin human 12.5 g (01/25/19 0550)  . ceFEPime (MAXIPIME) IV    . dexmedetomidine (PRECEDEX) IV infusion 0.8 mcg/kg/hr (01/25/19 1000)  . epinephrine 36 mcg/min (01/25/19 1000)  . famotidine (PEPCID) IV Stopped (01/25/19 1497)  . fentaNYL infusion INTRAVENOUS 200 mcg/hr (01/25/19 1000)  . heparin 1,500 Units/hr (01/25/19 1000)  . insulin 15 mL/hr at 01/25/19 1000  . lactated ringers    . lactated ringers Stopped (01/27/2019 2159)  . midazolam 5 mg/hr (01/25/19 1000)  . nitroGLYCERIN    . norepinephrine (LEVOPHED) Adult infusion 21 mcg/min (01/25/19 1000)  .  sodium bicarbonate (isotonic) infusion in sterile  water Stopped (01/25/19 0505)  . vancomycin Stopped (01/25/19 0305)  . vasopressin (PITRESSIN) infusion - *FOR SHOCK* 0.04 Units/min (01/25/19 1000)    Results for orders placed or performed during the hospital encounter of 02/05/2019 (from the past 48 hour(s))  Troponin I (High Sensitivity)     Status: Abnormal   Collection Time: 02/14/2019  3:00 PM  Result Value Ref Range   Troponin I (High Sensitivity) 35 (H) <18 ng/L    Comment: (NOTE) Elevated high sensitivity troponin I (hsTnI) values and significant  changes across serial measurements may suggest ACS but many other  chronic and acute conditions are known to elevate hsTnI results.  Refer to the "Links" section for chest pain algorithms and additional  guidance. Performed at [facility], [place] [address], [place], [place] [address]   CBC with Differential     Status: Abnormal   Collection Time: 02/15/2019  3:04 PM  Result Value Ref Range   WBC 15.6 (H) 4.0 - 10.5 K/uL   RBC 5.52 4.22 - 5.81 MIL/uL   Hemoglobin 16.6 13.0 - 17.0 g/dL   HCT 52.0 39.0 - 52.0 %   MCV 94.2 80.0 - 100.0 fL   MCH 30.1 26.0 - 34.0 pg   MCHC 31.9 30.0 - 36.0 g/dL   RDW 13.6 11.5 - 15.5 %   Platelets 242 150 - 400 K/uL   nRBC 0.0 0.0 - 0.2 %   Neutrophils Relative % 67 %   Neutro Abs 10.6 (H) 1.7 - 7.7 K/uL   Lymphocytes Relative 20 %   Lymphs Abs 3.0 0.7 - 4.0 K/uL   Monocytes Relative 10 %   Monocytes Absolute 1.5 (H) 0.1 - 1.0 K/uL   Eosinophils Relative 2 %   Eosinophils Absolute 0.3 0.0 - 0.5 K/uL   Basophils Relative 0 %   Basophils Absolute 0.1 0.0 - 0.1 K/uL   Immature Granulocytes 1 %   Abs Immature Granulocytes 0.11 (H) 0.00 - 0.07 K/uL    Comment: Performed at [facility] [address] [place], [place] [address]  Comprehensive metabolic panel     Status: Abnormal   Collection Time: 01/18/2019  3:04 PM  Result Value Ref Range   Sodium 139 135 - 145 mmol/L   Potassium 3.7 3.5 - 5.1 mmol/L   Chloride 107 98 - 111 mmol/L    CO2 17 (L) 22 - 32 mmol/L   Glucose, Bld 200 (H) 70 - 99 mg/dL   BUN 40 (H) 8 - 23 mg/dL   Creatinine, Ser 1.51 (H) 0.61 - 1.24 mg/dL   Calcium 9.6 8.9 - 10.3 mg/dL  Total Protein 8.1 6.5 - 8.1 g/dL   Albumin 4.0 3.5 - 5.0 g/dL   AST 42 (H) 15 - 41 U/L   ALT 39 0 - 44 U/L   Alkaline Phosphatase 91 38 - 126 U/L   Total Bilirubin 0.4 0.3 - 1.2 mg/dL   GFR calc non Af Amer 47 (L) >60 mL/min   GFR calc Af Amer 55 (L) >60 mL/min   Anion gap 15 5 - 15    Comment: Performed at [facility] [address], [place], [place] [address]  Protime-INR     Status: None   Collection Time: 01/18/2019  3:04 PM  Result Value Ref Range   Prothrombin Time 14.7 11.4 - 15.2 seconds   INR 1.2 0.8 - 1.2    Comment: (NOTE) INR goal varies based on device and disease states. Performed at [facility], [place] [address] [place], [place] [address]   Lactic acid, plasma     Status: Abnormal   Collection Time: 02/09/2019  3:05 PM  Result Value Ref Range   Lactic Acid, Venous 5.5 (HH) 0.5 - 1.9 mmol/L    Comment: CRITICAL RESULT CALLED TO, READ BACK BY AND VERIFIED WITH: B BECK,RN  [id number] 01/28/2019 Performed at [facility] [address], [place], [place] [address]   Brain natriuretic peptide     Status: None   Collection Time: 02/07/2019  3:05 PM  Result Value Ref Range   B Natriuretic Peptide 57.6 0.0 - 100.0 pg/mL    Comment: Performed at [facility], [place] [address], [place], [place] [address]  I-STAT 7, (LYTES, BLD GAS, ICA, H+H)     Status: Abnormal   Collection Time: 01/28/2019  3:05 PM  Result Value Ref Range   pH, Arterial 7.288 (L) 7.350 - 7.450   pCO2 arterial 36.8 32.0 - 48.0 mmHg   pO2, Arterial 43.0 (L) 83.0 - 108.0 mmHg   Bicarbonate 17.8 (L) 20.0 - 28.0 mmol/L   TCO2 19 (L) 22 - 32 mmol/L   O2 Saturation 76.0 %   Acid-base deficit 8.0 (H) 0.0 - 2.0 mmol/L   Sodium 139 135 - 145 mmol/L   Potassium 3.7 3.5 - 5.1 mmol/L   Calcium, Ion 1.30 1.15 - 1.40 mmol/L    HCT 49.0 39.0 - 52.0 %   Hemoglobin 16.7 13.0 - 17.0 g/dL   Patient temperature 97.4 F    Collection site RADIAL, ALLEN'S TEST ACCEPTABLE    Drawn by RT    Sample type ARTERIAL   I-Stat Creatinine, ED (not at [facility])     Status: Abnormal   Collection Time: 02/06/2019  3:07 PM  Result Value Ref Range   Creatinine, Ser 1.30 (H) 0.61 - 1.24 mg/dL  POC SARS Coronavirus 2 Ag-ED - Nasal Swab (BD Veritor Kit)     Status: None   Collection Time: 01/22/2019  3:13 PM  Result Value Ref Range   SARS Coronavirus 2 Ag NEGATIVE NEGATIVE    Comment: (NOTE) SARS-CoV-2 antigen NOT DETECTED.  Negative results are presumptive.  Negative results do not preclude SARS-CoV-2 infection and should not be used as the sole basis for treatment or other patient management decisions, including infection  control decisions, particularly in the presence of clinical signs and  symptoms consistent with COVID-19, or in those who have been in contact with the virus.  Negative results must be combined with clinical observations, patient history, and epidemiological information. The expected result is Negative. Fact  Sheet for Patients: PodPark.tn Fact Sheet for Healthcare Providers: GiftContent.is This test is not yet approved or cleared by the [place] FDA and  has been authorized for detection and/or diagnosis of SARS-CoV-2 by FDA under an Emergency Use Authorization (EUA).  This EUA will remain in effect (meaning this test can be used) for the duration of  the COVID-19 de claration under Section 564(b)(1) of the Act, 21 U.S.C. section 360bbb-3(b)(1), unless the authorization is terminated or revoked sooner.   Respiratory Panel by RT PCR (Flu A&B, Covid) - Nasopharyngeal Swab     Status: None   Collection Time: 01/29/2019  3:35 PM   Specimen: Nasopharyngeal Swab  Result Value Ref Range   SARS Coronavirus 2 by RT PCR NEGATIVE NEGATIVE    Comment:  (NOTE) SARS-CoV-2 target nucleic acids are NOT DETECTED. The SARS-CoV-2 RNA is generally detectable in upper respiratoy specimens during the acute phase of infection. The lowest concentration of SARS-CoV-2 viral copies this assay can detect is 131 copies/mL. A negative result does not preclude SARS-Cov-2 infection and should not be used as the sole basis for treatment or other patient management decisions. A negative result may occur with  improper specimen collection/handling, submission of specimen other than nasopharyngeal swab, presence of viral mutation(s) within the areas targeted by this assay, and inadequate number of viral copies (<131 copies/mL). A negative result must be combined with clinical observations, patient history, and epidemiological information. The expected result is Negative. Fact Sheet for Patients:  PinkCheek.be Fact Sheet for Healthcare Providers:  GravelBags.it This test is not yet ap proved or cleared by the [place] FDA and  has been authorized for detection and/or diagnosis of SARS-CoV-2 by FDA under an Emergency Use Authorization (EUA). This EUA will remain  in effect (meaning this test can be used) for the duration of the COVID-19 declaration under Section 564(b)(1) of the Act, 21 U.S.C. section 360bbb-3(b)(1), unless the authorization is terminated or revoked sooner.    Influenza A by PCR NEGATIVE NEGATIVE   Influenza B by PCR NEGATIVE NEGATIVE    Comment: (NOTE) The Xpert Xpress SARS-CoV-2/FLU/RSV assay is intended as an aid in  the diagnosis of influenza from Nasopharyngeal swab specimens and  should not be used as a sole basis for treatment. Nasal washings and  aspirates are unacceptable for Xpert Xpress SARS-CoV-2/FLU/RSV  testing. Fact Sheet for Patients: PinkCheek.be Fact Sheet for Healthcare Providers: GravelBags.it This  test is not yet approved or cleared by the [place] FDA and  has been authorized for detection and/or diagnosis of SARS-CoV-2 by  FDA under an Emergency Use Authorization (EUA). This EUA will remain  in effect (meaning this test can be used) for the duration of the  Covid-19 declaration under Section 564(b)(1) of the Act, 21  U.S.C. section 360bbb-3(b)(1), unless the authorization is  terminated or revoked. Performed at [facility], [place] [address], [place], [place] [address]   D-dimer, quantitative (not at [facility])     Status: Abnormal   Collection Time: 02/14/2019  4:13 PM  Result Value Ref Range   D-Dimer, Quant >20.00 (H) 0.00 - 0.50 ug/mL-FEU    Comment: (NOTE) At the manufacturer cut-off of 0.50 ug/mL FEU, this assay has been documented to exclude PE with a sensitivity and negative predictive value of 97 to 99%.  At this time, this assay has not been approved by the FDA to exclude DVT/VTE. Results should be correlated with clinical presentation. Performed at [facility], [place]  [address], [place], [place] [address]   Blood culture (routine x 2)     Status: None (Preliminary result)   Collection Time: 01/22/2019  4:45 PM   Specimen: BLOOD LEFT ARM  Result Value Ref Range   Specimen Description BLOOD LEFT ARM    Special Requests      BOTTLES DRAWN AEROBIC AND ANAEROBIC Blood Culture adequate volume   Culture      NO GROWTH < 24 HOURS Performed at [facility], [place] [address] [place], [place] [address]    Report Status PENDING   Troponin I (High Sensitivity)     Status: Abnormal   Collection Time: 01/18/2019  4:57 PM  Result Value Ref Range   Troponin I (High Sensitivity) 315 (HH) <18 ng/L    Comment: CRITICAL RESULT CALLED TO, READ BACK BY AND VERIFIED WITH: B BECK,RN 1755 02/07/2019 WBOND  (NOTE) Elevated high sensitivity troponin I (hsTnI) values and significant  changes across serial measurements may suggest ACS but many other  chronic and acute  conditions are known to elevate hsTnI results.  Refer to the Links section for chest pain algorithms and additional  guidance. Performed at [facility], [place] [address] [place], [place] [address]   Lactic acid, plasma     Status: Abnormal   Collection Time: 02/11/2019  5:05 PM  Result Value Ref Range   Lactic Acid, Venous 2.5 (HH) 0.5 - 1.9 mmol/L    Comment: CRITICAL VALUE NOTED.  VALUE IS CONSISTENT WITH PREVIOUSLY REPORTED AND CALLED VALUE. Performed at [facility], [place] [address] [place], [place] [address]   I-STAT 7, (LYTES, BLD GAS, ICA, H+H)     Status: Abnormal   Collection Time: 02/05/2019  8:51 PM  Result Value Ref Range   pH, Arterial 7.084 (LL) 7.350 - 7.450   pCO2 arterial 82.2 (HH) 32.0 - 48.0 mmHg   pO2, Arterial 69.0 (L) 83.0 - 108.0 mmHg   Bicarbonate 24.6 20.0 - 28.0 mmol/L   TCO2 27 22 - 32 mmol/L   O2 Saturation 84.0 %   Acid-base deficit 8.0 (H) 0.0 - 2.0 mmol/L   Sodium 141 135 - 145 mmol/L   Potassium 4.9 3.5 - 5.1 mmol/L   Calcium, Ion 1.20 1.15 - 1.40 mmol/L   HCT 46.0 39.0 - 52.0 %   Hemoglobin 15.6 13.0 - 17.0 g/dL   Patient temperature 98.6 F    Collection site BRACHIAL ARTERY    Drawn by RT    Sample type ARTERIAL    Comment VALUES EXPECTED, NO REPEAT   CBC     Status: Abnormal   Collection Time: 02/04/2019  9:50 PM  Result Value Ref Range   WBC 27.4 (H) 4.0 - 10.5 K/uL   RBC 5.01 4.22 - 5.81 MIL/uL   Hemoglobin 14.7 13.0 - 17.0 g/dL   HCT 47.1 39.0 - 52.0 %   MCV 94.0 80.0 - 100.0 fL   MCH 29.3 26.0 - 34.0 pg   MCHC 31.2 30.0 - 36.0 g/dL   RDW 13.7 11.5 - 15.5 %   Platelets 250 150 - 400 K/uL   nRBC 0.0 0.0 - 0.2 %    Comment: Performed at [facility], [place] [address] [place], [place] [address]  Basic metabolic panel     Status: Abnormal   Collection Time: 02/12/2019  9:50 PM  Result Value Ref Range   Sodium 142 135 - 145 mmol/L   Potassium 4.3 3.5 - 5.1 mmol/L  Chloride 107 98 - 111 mmol/L   CO2 20 (L) 22 - 32 mmol/L    Glucose, Bld 242 (H) 70 - 99 mg/dL   BUN 42 (H) 8 - 23 mg/dL   Creatinine, Ser 1.80 (H) 0.61 - 1.24 mg/dL   Calcium 7.9 (L) 8.9 - 10.3 mg/dL   GFR calc non Af Amer 38 (L) >60 mL/min   GFR calc Af Amer 44 (L) >60 mL/min   Anion gap 15 5 - 15    Comment: Performed at [facility] [address] [place], [place] [address]  Type and screen [place]     Status: None (Preliminary result)   Collection Time: 01/20/2019  9:50 PM  Result Value Ref Range   ABO/RH(D) A POS    Antibody Screen NEG    Sample Expiration 01/26/2019,2359    Unit Number [id number]    Blood Component Type RED CELLS,LR    Unit division 00    Status of Unit ISSUED,FINAL    Transfusion Status OK TO TRANSFUSE    Crossmatch Result Compatible    Unit Number [id number]    Blood Component Type RED CELLS,LR    Unit division 00    Status of Unit ISSUED,FINAL    Transfusion Status OK TO TRANSFUSE    Crossmatch Result Compatible    Unit Number [id number]    Blood Component Type RED CELLS,LR    Unit division 00    Status of Unit ISSUED,FINAL    Transfusion Status OK TO TRANSFUSE    Crossmatch Result      Compatible Performed at [facility], [place] [address], [place], [place] [address]    Unit Number [id number]    Blood Component Type RED CELLS,LR    Unit division 00    Status of Unit ISSUED    Transfusion Status OK TO TRANSFUSE    Crossmatch Result Compatible    Unit Number [id number]    Blood Component Type RED CELLS,LR    Unit division 00    Status of Unit ISSUED    Transfusion Status OK TO TRANSFUSE    Crossmatch Result Compatible    Unit Number [id number]    Blood Component Type RED CELLS,LR    Unit division 00    Status of Unit ISSUED    Transfusion Status OK TO TRANSFUSE    Crossmatch Result Compatible    Unit Number [id number]    Blood Component Type RED CELLS,LR    Unit division 00    Status of Unit ALLOCATED    Transfusion Status OK TO TRANSFUSE     Crossmatch Result Compatible    Unit Number [id number]    Blood Component Type RED CELLS,LR    Unit division 00    Status of Unit ALLOCATED    Transfusion Status OK TO TRANSFUSE    Crossmatch Result Compatible    Unit Number [id number]    Blood Component Type RED CELLS,LR    Unit division 00    Status of Unit ALLOCATED    Transfusion Status OK TO TRANSFUSE    Crossmatch Result Compatible    Unit Number [id number]    Blood Component Type RED CELLS,LR    Unit division 00    Status of Unit ALLOCATED    Transfusion Status OK TO TRANSFUSE    Crossmatch Result Compatible   ABO/Rh     Status: None   Collection Time: 01/26/2019  9:50 PM  Result Value Ref Range   ABO/RH(D)  A POS Performed at [facility], [place] [address] [place], [place] [address]   I-STAT 7, (LYTES, BLD GAS, ICA, H+H)     Status: Abnormal   Collection Time: 02/13/2019  9:57 PM  Result Value Ref Range   pH, Arterial 7.183 (LL) 7.350 - 7.450   pCO2 arterial 59.6 (H) 32.0 - 48.0 mmHg   pO2, Arterial 80.0 (L) 83.0 - 108.0 mmHg   Bicarbonate 22.5 20.0 - 28.0 mmol/L   TCO2 24 22 - 32 mmol/L   O2 Saturation 92.0 %   Acid-base deficit 7.0 (H) 0.0 - 2.0 mmol/L   Sodium 140 135 - 145 mmol/L   Potassium 4.2 3.5 - 5.1 mmol/L   Calcium, Ion 1.16 1.15 - 1.40 mmol/L   HCT 46.0 39.0 - 52.0 %   Hemoglobin 15.6 13.0 - 17.0 g/dL   Patient temperature 98.0 F    Sample type ARTERIAL    Comment NOTIFIED PHYSICIAN   I-STAT 7, (LYTES, BLD GAS, ICA, H+H)     Status: Abnormal   Collection Time: 01/22/2019 12:07 AM  Result Value Ref Range   pH, Arterial 7.200 (L) 7.350 - 7.450   pCO2 arterial 48.9 (H) 32.0 - 48.0 mmHg   pO2, Arterial 116.0 (H) 83.0 - 108.0 mmHg   Bicarbonate 19.0 (L) 20.0 - 28.0 mmol/L   TCO2 20 (L) 22 - 32 mmol/L   O2 Saturation 97.0 %   Acid-base deficit 9.0 (H) 0.0 - 2.0 mmol/L   Sodium 138 135 - 145 mmol/L   Potassium 5.5 (H) 3.5 - 5.1 mmol/L   Calcium, Ion 1.09 (L) 1.15 - 1.40 mmol/L   HCT  43.0 39.0 - 52.0 %   Hemoglobin 14.6 13.0 - 17.0 g/dL   Patient temperature 37.6 C    Collection site RADIAL, ALLEN'S TEST ACCEPTABLE    Drawn by RT    Sample type ARTERIAL   Blood culture (routine x 2)     Status: None (Preliminary result)   Collection Time: 01/29/2019 12:29 AM   Specimen: BLOOD  Result Value Ref Range   Specimen Description BLOOD A-LINE    Special Requests      BOTTLES DRAWN AEROBIC ONLY Blood Culture adequate volume   Culture      NO GROWTH < 24 HOURS Performed at [facility], [address] [place], [place] [address]    Report Status PENDING   HIV Antibody (routine testing w rflx)     Status: None   Collection Time: 01/21/2019 12:29 AM  Result Value Ref Range   HIV Screen 4th Generation wRfx NON REACTIVE NON REACTIVE    Comment: Performed at [facility], [place] [address] [place], [place] [address]  Lactic acid, plasma     Status: Abnormal   Collection Time: 01/25/2019 12:29 AM  Result Value Ref Range   Lactic Acid, Venous 3.2 (HH) 0.5 - 1.9 mmol/L    Comment: CRITICAL VALUE NOTED.  VALUE IS CONSISTENT WITH PREVIOUSLY REPORTED AND CALLED VALUE. Performed at [facility], [place] [address], [place],  [address]   Procalcitonin - Baseline     Status: None   Collection Time: 02/11/2019 12:29 AM  Result Value Ref Range   Procalcitonin 0.61 ng/mL    Comment:        Interpretation: PCT > 0.5 ng/mL and <= 2 ng/mL: Systemic infection (sepsis) is possible, but other conditions are known to elevate PCT as well. (NOTE)       Sepsis PCT  Algorithm           Lower Respiratory Tract                                      Infection PCT Algorithm    ----------------------------     ----------------------------         PCT < 0.25 ng/mL                PCT < 0.10 ng/mL         Strongly encourage             Strongly discourage   discontinuation of antibiotics    initiation of antibiotics    ----------------------------     -----------------------------        PCT 0.25 - 0.50 ng/mL            PCT 0.10 - 0.25 ng/mL               OR       >80% decrease in PCT            Discourage initiation of                                            antibiotics      Encourage discontinuation           of antibiotics    ----------------------------     -----------------------------         PCT >= 0.50 ng/mL              PCT 0.26 - 0.50 ng/mL                AND       <80% decrease in PCT             Encourage initiation of                                             antibiotics       Encourage continuation           of antibiotics    ----------------------------     -----------------------------        PCT >= 0.50 ng/mL                  PCT > 0.50 ng/mL               AND         increase in PCT                  Strongly encourage                                      initiation of antibiotics    Strongly encourage escalation           of antibiotics                                     -----------------------------  PCT <= 0.25 ng/mL                                                 OR                                        > 80% decrease in PCT                                     Discontinue / Do not initiate                                             antibiotics Performed at [facility], [place] [address] [place], [place] [address]   APTT     Status: Abnormal   Collection Time: 02/13/2019 12:29 AM  Result Value Ref Range   aPTT 50 (H) 24 - 36 seconds    Comment:        IF BASELINE aPTT IS ELEVATED, SUGGEST PATIENT RISK ASSESSMENT BE USED TO DETERMINE APPROPRIATE ANTICOAGULANT THERAPY. Performed at [facility], [place] [address], [place], [place] [address]   Protime-INR     Status: Abnormal   Collection Time: 02/14/2019 12:29 AM  Result Value Ref Range   Prothrombin Time 17.6 (H) 11.4 - 15.2 seconds   INR 1.5 (H) 0.8 - 1.2    Comment: (NOTE) INR goal varies based on device and disease  states. Performed at [facility], [place] [address], [place], [place] [address]   Magnesium     Status: None   Collection Time: 02/04/2019 12:29 AM  Result Value Ref Range   Magnesium 2.0 1.7 - 2.4 mg/dL    Comment: Performed at [facility], [place] [address], [place], [place] [address]  Troponin I (High Sensitivity)     Status: Abnormal   Collection Time: 01/19/2019 12:29 AM  Result Value Ref Range   Troponin I (High Sensitivity) 884 (HH) <18 ng/L    Comment: CRITICAL VALUE NOTED.  VALUE IS CONSISTENT WITH PREVIOUSLY REPORTED AND CALLED VALUE. Performed at [facility], [place] [address], [place], [place] [address]   MRSA PCR Screening     Status: None   Collection Time: 01/20/2019  1:41 AM   Specimen: Nasal Mucosa; Nasopharyngeal  Result Value Ref Range   MRSA by PCR NEGATIVE NEGATIVE    Comment:        The GeneXpert MRSA Assay (FDA approved for NASAL specimens only), is one component of a comprehensive MRSA colonization surveillance program. It is not intended to diagnose MRSA infection nor to guide or monitor treatment for MRSA infections. Performed at [facility], [place] [address], [place], [place] [address]   I-STAT 7, (LYTES, BLD GAS, ICA, H+H)     Status: Abnormal   Collection Time: 02/08/2019  2:06 AM  Result Value Ref Range   pH, Arterial 7.248 (L) 7.350 - 7.450   pCO2 arterial 52.4 (H) 32.0 - 48.0 mmHg   pO2, Arterial 352.0 (H) 83.0 - 108.0 mmHg   Bicarbonate  22.7 20.0 - 28.0 mmol/L   TCO2 24 22 - 32 mmol/L   O2 Saturation 100.0 %   Acid-base deficit 5.0 (H) 0.0 - 2.0 mmol/L   Sodium 141 135 - 145 mmol/L   Potassium 3.8 3.5 - 5.1 mmol/L   Calcium, Ion 1.01 (L) 1.15 - 1.40 mmol/L   HCT 43.0 39.0 - 52.0 %   Hemoglobin 14.6 13.0 - 17.0 g/dL   Patient temperature 37.5 C    Collection site ARTERIAL LINE    Drawn by RT    Sample type ARTERIAL   Glucose, capillary     Status: Abnormal   Collection Time: 01/31/2019  2:26 AM  Result Value Ref Range    Glucose-Capillary 280 (H) 70 - 99 mg/dL  Urinalysis, Routine w reflex microscopic     Status: Abnormal   Collection Time: 01/27/2019  3:17 AM  Result Value Ref Range   Color, Urine YELLOW YELLOW   APPearance CLOUDY (A) CLEAR   Specific Gravity, Urine >1.046 (H) 1.005 - 1.030   pH 5.0 5.0 - 8.0   Glucose, UA NEGATIVE NEGATIVE mg/dL   Hgb urine dipstick MODERATE (A) NEGATIVE   Bilirubin Urine NEGATIVE NEGATIVE   Ketones, ur NEGATIVE NEGATIVE mg/dL   Protein, ur 100 (A) NEGATIVE mg/dL   Nitrite NEGATIVE NEGATIVE   Leukocytes,Ua NEGATIVE NEGATIVE   RBC / HPF 11-20 0 - 5 RBC/hpf   WBC, UA 0-5 0 - 5 WBC/hpf   Bacteria, UA FEW (A) NONE SEEN   Squamous Epithelial / LPF 0-5 0 - 5   Mucus PRESENT    Hyaline Casts, UA PRESENT    Granular Casts, UA PRESENT     Comment: Performed at [facility], [address], [place], [place] [address]  CBC     Status: Abnormal   Collection Time: 01/31/2019  4:54 AM  Result Value Ref Range   WBC 26.5 (H) 4.0 - 10.5 K/uL   RBC 4.70 4.22 - 5.81 MIL/uL   Hemoglobin 13.9 13.0 - 17.0 g/dL   HCT 44.2 39.0 - 52.0 %   MCV 94.0 80.0 - 100.0 fL   MCH 29.6 26.0 - 34.0 pg   MCHC 31.4 30.0 - 36.0 g/dL   RDW 13.8 11.5 - 15.5 %   Platelets 246 150 - 400 K/uL   nRBC 0.0 0.0 - 0.2 %    Comment: Performed at [facility], [place] [address], [place], [place] [address]  Basic metabolic panel     Status: Abnormal   Collection Time: 01/31/2019  4:54 AM  Result Value Ref Range   Sodium 144 135 - 145 mmol/L   Potassium 3.4 (L) 3.5 - 5.1 mmol/L   Chloride 104 98 - 111 mmol/L   CO2 19 (L) 22 - 32 mmol/L   Glucose, Bld 390 (H) 70 - 99 mg/dL   BUN 54 (H) 8 - 23 mg/dL   Creatinine, Ser 2.33 (H) 0.61 - 1.24 mg/dL   Calcium 7.6 (L) 8.9 - 10.3 mg/dL   GFR calc non Af Amer 28 (L) >60 mL/min   GFR calc Af Amer 32 (L) >60 mL/min   Anion gap 21 (H) 5 - 15    Comment: Performed at [facility] [address], [place], [place] [address]  Phosphorus     Status: Abnormal    Collection Time: 02/04/2019  4:54 AM  Result Value Ref Range   Phosphorus 9.1 (H) 2.5 - 4.6 mg/dL    Comment: Performed at  [facility], [place] [address] [place], [place] [address]  Magnesium     Status: None   Collection Time: 02/16/2019  4:54 AM  Result Value Ref Range   Magnesium 2.1 1.7 - 2.4 mg/dL    Comment: Performed at [facility] [address] [place], [place] [address]  Triglycerides     Status: Abnormal   Collection Time: 02/13/2019  4:54 AM  Result Value Ref Range   Triglycerides 224 (H) <150 mg/dL    Comment: Performed at [facility] [address], [place], [place] [address]  Hemoglobin A1c     Status: Abnormal   Collection Time: 01/18/2019  4:54 AM  Result Value Ref Range   Hgb A1c MFr Bld 6.3 (H) 4.8 - 5.6 %    Comment: (NOTE) Pre diabetes:          5.7%-6.4% Diabetes:              >6.4% Glycemic control for   <7.0% adults with diabetes    Mean Plasma Glucose 134.11 mg/dL    Comment: Performed at [facility] [address], [place], [place] [address]  Glucose, capillary     Status: Abnormal   Collection Time: 02/07/2019  5:10 AM  Result Value Ref Range   Glucose-Capillary 334 (H) 70 - 99 mg/dL  Glucose, capillary     Status: Abnormal   Collection Time: 01/29/2019  7:34 AM  Result Value Ref Range   Glucose-Capillary 419 (H) 70 - 99 mg/dL  I-STAT 7, (LYTES, BLD GAS, ICA, H+H)     Status: Abnormal   Collection Time: 02/07/2019  7:50 AM  Result Value Ref Range   pH, Arterial 7.201 (L) 7.350 - 7.450   pCO2 arterial 57.9 (H) 32.0 - 48.0 mmHg   pO2, Arterial 243.0 (H) 83.0 - 108.0 mmHg   Bicarbonate 22.4 20.0 - 28.0 mmol/L   TCO2 24 22 - 32 mmol/L   O2 Saturation 100.0 %   Acid-base deficit 6.0 (H) 0.0 - 2.0 mmol/L   Sodium 142 135 - 145 mmol/L   Potassium 3.6 3.5 - 5.1 mmol/L   Calcium, Ion 0.97 (L) 1.15 - 1.40 mmol/L   HCT 39.0 39.0 - 52.0 %   Hemoglobin 13.3 13.0 - 17.0 g/dL   Patient temperature 100.4 F    Collection site ARTERIAL  LINE    Drawn by RT    Sample type ARTERIAL   Glucose, capillary     Status: Abnormal   Collection Time: 02/05/2019  9:09 AM  Result Value Ref Range   Glucose-Capillary 436 (H) 70 - 99 mg/dL  Glucose, capillary     Status: Abnormal   Collection Time: 01/27/2019 10:25 AM  Result Value Ref Range   Glucose-Capillary 447 (H) 70 - 99 mg/dL  Heparin level     Status: None   Collection Time: 01/25/2019 10:30 AM  Result Value Ref Range   Heparin Unfractionated 0.64 0.30 - 0.70 IU/mL    Comment: (NOTE) If heparin results are below expected values, and patient dosage has  been confirmed, suggest follow up testing of antithrombin III levels. Performed at [facility], [place] [address] [place], [place] [address]   Procalcitonin - Baseline     Status: None   Collection Time: 01/22/2019 10:30 AM  Result Value Ref Range   Procalcitonin 1.12 ng/mL    Comment:        Interpretation: PCT > 0.5 ng/mL and <= 2 ng/mL:  Systemic infection (sepsis) is possible, but other conditions are known to elevate PCT as well. (NOTE)       Sepsis PCT Algorithm           Lower Respiratory Tract                                      Infection PCT Algorithm    ----------------------------     ----------------------------         PCT < 0.25 ng/mL                PCT < 0.10 ng/mL         Strongly encourage             Strongly discourage   discontinuation of antibiotics    initiation of antibiotics    ----------------------------     -----------------------------       PCT 0.25 - 0.50 ng/mL            PCT 0.10 - 0.25 ng/mL               OR       >80% decrease in PCT            Discourage initiation of                                            antibiotics      Encourage discontinuation           of antibiotics    ----------------------------     -----------------------------         PCT >= 0.50 ng/mL              PCT 0.26 - 0.50 ng/mL                AND       <80% decrease in PCT             Encourage initiation  of                                             antibiotics       Encourage continuation           of antibiotics    ----------------------------     -----------------------------        PCT >= 0.50 ng/mL                  PCT > 0.50 ng/mL               AND         increase in PCT                  Strongly encourage                                      initiation of antibiotics    Strongly encourage escalation           of antibiotics                                     -----------------------------  PCT <= 0.25 ng/mL                                                 OR                                        > 80% decrease in PCT                                     Discontinue / Do not initiate                                             antibiotics Performed at [facility], [place] [address], [place], [place] [address]   Protime-INR     Status: Abnormal   Collection Time: 01/18/2019 11:11 AM  Result Value Ref Range   Prothrombin Time 19.2 (H) 11.4 - 15.2 seconds   INR 1.6 (H) 0.8 - 1.2    Comment: (NOTE) INR goal varies based on device and disease states. Performed at [facility], [place] [address], [place], [place] [address]   Glucose, capillary     Status: Abnormal   Collection Time: 01/18/2019 11:34 AM  Result Value Ref Range   Glucose-Capillary 418 (H) 70 - 99 mg/dL  Prepare RBC (crossmatch)     Status: None   Collection Time: 01/25/2019 12:00 PM  Result Value Ref Range   Order Confirmation      ORDER PROCESSED BY BLOOD BANK Performed at [facility], [place] [address], [place], [place] [address]   Glucose, capillary     Status: Abnormal   Collection Time: 02/13/2019 12:44 PM  Result Value Ref Range   Glucose-Capillary 402 (H) 70 - 99 mg/dL  I-STAT 7, (LYTES, BLD GAS, ICA, H+H)     Status: Abnormal   Collection Time: 01/17/2019  1:46 PM  Result Value Ref Range   pH, Arterial 7.282 (L) 7.350 - 7.450   pCO2 arterial 48.8 (H)  32.0 - 48.0 mmHg   pO2, Arterial 229.0 (H) 83.0 - 108.0 mmHg   Bicarbonate 23.0 20.0 - 28.0 mmol/L   TCO2 24 22 - 32 mmol/L   O2 Saturation 100.0 %   Acid-base deficit 4.0 (H) 0.0 - 2.0 mmol/L   Sodium 143 135 - 145 mmol/L   Potassium 3.0 (L) 3.5 - 5.1 mmol/L   Calcium, Ion 1.07 (L) 1.15 - 1.40 mmol/L   HCT 33.0 (L) 39.0 - 52.0 %   Hemoglobin 11.2 (L) 13.0 - 17.0 g/dL   Patient temperature HIDE    Sample type ARTERIAL   I-STAT, chem 8     Status: Abnormal   Collection Time: 02/04/2019  1:53 PM  Result Value Ref Range   Sodium 144 135 - 145 mmol/L   Potassium 3.2 (L) 3.5 - 5.1 mmol/L   Chloride 105 98 - 111 mmol/L   BUN 49 (H) 8 - 23 mg/dL   Creatinine, Ser 2.20 (H) 0.61 - 1.24 mg/dL   Glucose, Bld 424 (H) 70 - 99 mg/dL   Calcium, Ion  1.04 (L) 1.15 - 1.40 mmol/L   TCO2 23 22 - 32 mmol/L   Hemoglobin 11.6 (L) 13.0 - 17.0 g/dL   HCT 34.0 (L) 39.0 - 52.0 %  Prepare fresh frozen plasma     Status: None   Collection Time: 01/25/2019  3:34 PM  Result Value Ref Range   Unit Number [id number]    Blood Component Type THAWED PLASMA    Unit division 00    Status of Unit REL FROM [facility]    Transfusion Status      OK TO TRANSFUSE Performed at [facility] [address], [place], [place] [address]    Unit Number [id number]    Blood Component Type THAWED PLASMA    Unit division 00    Status of Unit REL FROM [facility]    Transfusion Status OK TO TRANSFUSE   I-STAT, chem 8     Status: Abnormal   Collection Time: 01/28/2019  4:40 PM  Result Value Ref Range   Sodium 145 135 - 145 mmol/L   Potassium 3.0 (L) 3.5 - 5.1 mmol/L   Chloride 101 98 - 111 mmol/L   BUN 56 (H) 8 - 23 mg/dL   Creatinine, Ser 2.10 (H) 0.61 - 1.24 mg/dL   Glucose, Bld 360 (H) 70 - 99 mg/dL   Calcium, Ion 0.99 (L) 1.15 - 1.40 mmol/L   TCO2 26 22 - 32 mmol/L   Hemoglobin 8.2 (L) 13.0 - 17.0 g/dL   HCT 24.0 (L) 39.0 - 52.0 %  I-STAT 7, (LYTES, BLD GAS, ICA, H+H)     Status: Abnormal   Collection Time: 01/22/2019   4:57 PM  Result Value Ref Range   pH, Arterial 7.230 (L) 7.350 - 7.450   pCO2 arterial 63.1 (H) 32.0 - 48.0 mmHg   pO2, Arterial 331.0 (H) 83.0 - 108.0 mmHg   Bicarbonate 26.3 20.0 - 28.0 mmol/L   TCO2 28 22 - 32 mmol/L   O2 Saturation 100.0 %   Acid-base deficit 1.0 0.0 - 2.0 mmol/L   Sodium 145 135 - 145 mmol/L   Potassium 2.9 (L) 3.5 - 5.1 mmol/L   Calcium, Ion 0.99 (L) 1.15 - 1.40 mmol/L   HCT 25.0 (L) 39.0 - 52.0 %   Hemoglobin 8.5 (L) 13.0 - 17.0 g/dL   Patient temperature 37.5 C    Collection site ARTERIAL LINE    Drawn by Nurse    Sample type CARDIOPULMONARY BYPASS   CBC every 6 hours x 4 post-procedure     Status: Abnormal   Collection Time: 02/12/2019  5:36 PM  Result Value Ref Range   WBC 21.7 (H) 4.0 - 10.5 K/uL   RBC 2.78 (L) 4.22 - 5.81 MIL/uL   Hemoglobin 8.5 (L) 13.0 - 17.0 g/dL    Comment: REPEATED TO VERIFY   HCT 25.5 (L) 39.0 - 52.0 %   MCV 91.7 80.0 - 100.0 fL   MCH 30.6 26.0 - 34.0 pg   MCHC 33.3 30.0 - 36.0 g/dL   RDW 13.5 11.5 - 15.5 %   Platelets 152 150 - 400 K/uL   nRBC 0.2 0.0 - 0.2 %    Comment: Performed at [facility], [place] [address] [place], [place] [address]  Fibrinogen every 6 hours x 4 post-procedure     Status: Abnormal   Collection Time: 01/27/2019  5:36 PM  Result Value Ref Range   Fibrinogen 117 (L) 210 - 475 mg/dL    Comment: Performed at [facility], [place] [address] [place], [place] [address]  Protime-INR     Status: Abnormal   Collection Time: 02/05/2019  5:36 PM  Result Value Ref Range   Prothrombin Time 22.9 (H) 11.4 - 15.2 seconds   INR 2.0 (H) 0.8 - 1.2    Comment: (NOTE) INR goal varies based on device and disease states. Performed at [facility], [place] [address] [place], [place] [address]   APTT     Status: Abnormal   Collection Time: 01/31/2019  5:36 PM  Result Value Ref Range   aPTT 138 (H) 24 - 36 seconds    Comment:        IF BASELINE aPTT IS ELEVATED, SUGGEST PATIENT RISK ASSESSMENT BE USED TO  DETERMINE APPROPRIATE ANTICOAGULANT THERAPY. Performed at [facility], [place] [address], [place], [place] [address]   Lactic acid, plasma     Status: Abnormal   Collection Time: 02/05/2019  6:37 PM  Result Value Ref Range   Lactic Acid, Venous 5.0 (HH) 0.5 - 1.9 mmol/L    Comment: CRITICAL VALUE NOTED.  VALUE IS CONSISTENT WITH PREVIOUSLY REPORTED AND CALLED VALUE. Performed at [facility], [place] [address], [place], [place] [address]   I-STAT 7, (LYTES, BLD GAS, ICA, H+H)     Status: Abnormal   Collection Time: 02/11/2019  8:39 PM  Result Value Ref Range   pH, Arterial 7.421 7.350 - 7.450   pCO2 arterial 45.4 32.0 - 48.0 mmHg   pO2, Arterial 453.0 (H) 83.0 - 108.0 mmHg   Bicarbonate 29.5 (H) 20.0 - 28.0 mmol/L   TCO2 31 22 - 32 mmol/L   O2 Saturation 100.0 %   Acid-Base Excess 5.0 (H) 0.0 - 2.0 mmol/L   Sodium 147 (H) 135 - 145 mmol/L   Potassium 2.9 (L) 3.5 - 5.1 mmol/L   Calcium, Ion 1.05 (L) 1.15 - 1.40 mmol/L   HCT 23.0 (L) 39.0 - 52.0 %   Hemoglobin 7.8 (L) 13.0 - 17.0 g/dL   Patient temperature 37.0 C    Sample type ARTERIAL   Glucose, capillary     Status: Abnormal   Collection Time: 02/09/2019  8:53 PM  Result Value Ref Range   Glucose-Capillary 224 (H) 70 - 99 mg/dL  Heparin level (unfractionated) every 6 hours x 4 post-procedure     Status: None   Collection Time: 01/20/2019  8:59 PM  Result Value Ref Range   Heparin Unfractionated 0.34 0.30 - 0.70 IU/mL    Comment: (NOTE) If heparin results are below expected values, and patient dosage has  been confirmed, suggest follow up testing of antithrombin III levels. Performed at [facility], [place] [address], [place], [place] [address]   CBC every 6 hours x 4 post-procedure     Status: Abnormal   Collection Time: 01/28/2019  8:59 PM  Result Value Ref Range   WBC 23.1 (H) 4.0 - 10.5 K/uL   RBC 2.63 (L) 4.22 - 5.81 MIL/uL   Hemoglobin 8.1 (L) 13.0 - 17.0 g/dL   HCT 23.7 (L) 39.0 - 52.0 %   MCV 90.1 80.0 -  100.0 fL   MCH 30.8 26.0 - 34.0 pg   MCHC 34.2 30.0 - 36.0 g/dL   RDW 13.4 11.5 - 15.5 %   Platelets 122 (L) 150 - 400 K/uL   nRBC 0.2 0.0 - 0.2 %    Comment: Performed at [facility], [place] [address], [place], [place] [address]  Fibrinogen every 6 hours x 4 post-procedure     Status: Abnormal   Collection Time: 01/21/2019  8:59 PM  Result Value Ref Range   Fibrinogen 112 (L) 210 - 475 mg/dL    Comment: Performed at [facility] [address] [place], [place] [address]  APTT     Status: Abnormal   Collection Time: 02/14/2019  8:59 PM  Result Value Ref Range   aPTT 137 (H) 24 - 36 seconds    Comment:        IF BASELINE aPTT IS ELEVATED, SUGGEST PATIENT RISK ASSESSMENT BE USED TO DETERMINE APPROPRIATE ANTICOAGULANT THERAPY. Performed at [facility], [place] [address] [place], [place] [address]   Basic metabolic panel     Status: Abnormal   Collection Time: 02/05/2019  8:59 PM  Result Value Ref Range   Sodium 142 135 - 145 mmol/L   Potassium 2.8 (L) 3.5 - 5.1 mmol/L   Chloride 105 98 - 111 mmol/L   CO2 25 22 - 32 mmol/L   Glucose, Bld 280 (H) 70 - 99 mg/dL   BUN 55 (H) 8 - 23 mg/dL   Creatinine, Ser 2.32 (H) 0.61 - 1.24 mg/dL   Calcium 8.0 (L) 8.9 - 10.3 mg/dL   GFR calc non Af Amer 28 (L) >60 mL/min   GFR calc Af Amer 32 (L) >60 mL/min   Anion gap 12 5 - 15    Comment: Performed at [facility] [address], [place], [place] [address]  Prepare RBC     Status: None   Collection Time: 01/26/2019  9:52 PM  Result Value Ref Range   Order Confirmation      ORDER PROCESSED BY BLOOD BANK BB SAMPLE OR UNITS ALREADY AVAILABLE Performed at [facility], [place] [address], [place], [place] [address]   Prepare cryoprecipitate     Status: None   Collection Time: 01/27/2019  9:52 PM  Result Value Ref Range   Unit Number [id number]    Blood Component Type CRYPOOL THAW    Unit division 00    Status of Unit ISSUED,FINAL    Transfusion Status      OK TO  TRANSFUSE Performed at [facility] [address] [place], [place] [address]   Glucose, capillary     Status: Abnormal   Collection Time: 01/25/2019 10:05 PM  Result Value Ref Range   Glucose-Capillary 222 (H) 70 - 99 mg/dL  Lactic acid, plasma     Status: Abnormal   Collection Time: 01/25/2019 10:14 PM  Result Value Ref Range   Lactic Acid, Venous 4.3 (HH) 0.5 - 1.9 mmol/L    Comment: CRITICAL VALUE NOTED.  VALUE IS CONSISTENT WITH PREVIOUSLY REPORTED AND CALLED VALUE. Performed at [facility], [place] [address] [place], [place] [address]   Glucose, capillary     Status: Abnormal   Collection Time: 01/31/2019 11:21 PM  Result Value Ref Range   Glucose-Capillary 202 (H) 70 - 99 mg/dL  Glucose, capillary     Status: Abnormal   Collection Time: 01/25/19 12:06 AM  Result Value Ref Range   Glucose-Capillary 219 (H) 70 - 99 mg/dL  Glucose, capillary     Status: Abnormal   Collection Time: 01/25/19  1:12 AM  Result Value Ref Range   Glucose-Capillary 197 (H) 70 - 99 mg/dL  CBC     Status: Abnormal   Collection Time: 01/25/19  1:54 AM  Result Value Ref  Range   WBC 27.7 (H) 4.0 - 10.5 K/uL   RBC 2.46 (L) 4.22 - 5.81 MIL/uL   Hemoglobin 7.5 (L) 13.0 - 17.0 g/dL   HCT 22.3 (L) 39.0 - 52.0 %   MCV 90.7 80.0 - 100.0 fL   MCH 30.5 26.0 - 34.0 pg   MCHC 33.6 30.0 - 36.0 g/dL   RDW 13.3 11.5 - 15.5 %   Platelets 105 (L) 150 - 400 K/uL    Comment: REPEATED TO VERIFY PLATELET COUNT CONFIRMED BY SMEAR Immature Platelet Fraction may be clinically indicated, consider ordering this additional test [id number]    nRBC 0.2 0.0 - 0.2 %    Comment: Performed at [facility], [place] [address], [place], [place] [address]  Basic metabolic panel     Status: Abnormal   Collection Time: 01/25/19  2:00 AM  Result Value Ref Range   Sodium 142 135 - 145 mmol/L   Potassium 3.3 (L) 3.5 - 5.1 mmol/L   Chloride 107 98 - 111 mmol/L   CO2 25 22 - 32 mmol/L   Glucose, Bld 255 (H) 70 - 99 mg/dL    BUN 55 (H) 8 - 23 mg/dL   Creatinine, Ser 1.91 (H) 0.61 - 1.24 mg/dL   Calcium 7.0 (L) 8.9 - 10.3 mg/dL   GFR calc non Af Amer 35 (L) >60 mL/min   GFR calc Af Amer 41 (L) >60 mL/min   Anion gap 10 5 - 15    Comment: Performed at [facility] [address], [place], [place] [address]  Magnesium     Status: Abnormal   Collection Time: 01/25/19  2:00 AM  Result Value Ref Range   Magnesium 1.5 (L) 1.7 - 2.4 mg/dL    Comment: Performed at [facility] [address], [place], [place] [address]  Lactate dehydrogenase     Status: Abnormal   Collection Time: 01/25/19  2:00 AM  Result Value Ref Range   LDH 293 (H) 98 - 192 U/L    Comment: Performed at [facility], [place] [address] [place], [place] [address]  Lactic acid, plasma     Status: Abnormal   Collection Time: 01/25/19  2:00 AM  Result Value Ref Range   Lactic Acid, Venous 4.0 (HH) 0.5 - 1.9 mmol/L    Comment: CRITICAL VALUE NOTED.  VALUE IS CONSISTENT WITH PREVIOUSLY REPORTED AND CALLED VALUE. Performed at [facility], [place] [address], [place], [place] [address]   .Cooxemetry Panel (carboxy, met, total hgb, O2 sat)     Status: Abnormal   Collection Time: 01/25/19  2:00 AM  Result Value Ref Range   Total hemoglobin 8.2 (L) 12.0 - 16.0 g/dL   O2 Saturation 64.8 %   Carboxyhemoglobin 1.7 (H) 0.5 - 1.5 %   Methemoglobin 1.7 (H) 0.0 - 1.5 %    Comment: Performed at [facility] [address], [place], [place] [address]  Glucose, capillary     Status: Abnormal   Collection Time: 01/25/19  2:00 AM  Result Value Ref Range   Glucose-Capillary 229 (H) 70 - 99 mg/dL  Glucose, capillary     Status: Abnormal   Collection Time: 01/25/19  3:02 AM  Result Value Ref Range   Glucose-Capillary 215 (H) 70 - 99 mg/dL  Prepare RBC     Status: None   Collection Time: 01/25/19  3:03 AM  Result Value Ref Range   Order Confirmation  ORDER PROCESSED BY BLOOD BANK BB SAMPLE OR UNITS ALREADY  AVAILABLE Performed at [facility], [place] [address], [place], [place] [address]   Glucose, capillary     Status: Abnormal   Collection Time: 01/25/19  4:08 AM  Result Value Ref Range   Glucose-Capillary 200 (H) 70 - 99 mg/dL  Heparin level (unfractionated) every 6 hours x 4 post-procedure     Status: Abnormal   Collection Time: 01/25/19  5:00 AM  Result Value Ref Range   Heparin Unfractionated <0.10 (L) 0.30 - 0.70 IU/mL    Comment: (NOTE) If heparin results are below expected values, and patient dosage has  been confirmed, suggest follow up testing of antithrombin III levels. Performed at [facility], [place] [address], [place], [place] [address]   Fibrinogen     Status: Abnormal   Collection Time: 01/25/19  5:00 AM  Result Value Ref Range   Fibrinogen 180 (L) 210 - 475 mg/dL    Comment: Performed at [facility] [address] [place], [place] [address]  Glucose, capillary     Status: Abnormal   Collection Time: 01/25/19  5:01 AM  Result Value Ref Range   Glucose-Capillary 226 (H) 70 - 99 mg/dL  I-STAT 7, (LYTES, BLD GAS, ICA, H+H)     Status: Abnormal   Collection Time: 01/25/19  5:04 AM  Result Value Ref Range   pH, Arterial 7.463 (H) 7.350 - 7.450   pCO2 arterial 38.3 32.0 - 48.0 mmHg   pO2, Arterial 397.0 (H) 83.0 - 108.0 mmHg   Bicarbonate 27.3 20.0 - 28.0 mmol/L   TCO2 28 22 - 32 mmol/L   O2 Saturation 100.0 %   Acid-Base Excess 3.0 (H) 0.0 - 2.0 mmol/L   Sodium 141 135 - 145 mmol/L   Potassium 3.5 3.5 - 5.1 mmol/L   Calcium, Ion 0.96 (L) 1.15 - 1.40 mmol/L   HCT 22.0 (L) 39.0 - 52.0 %   Hemoglobin 7.5 (L) 13.0 - 17.0 g/dL   Patient temperature 37.6 C    Sample type ARTERIAL   Prepare RBC     Status: None   Collection Time: 01/25/19  5:30 AM  Result Value Ref Range   Order Confirmation      ORDER PROCESSED BY BLOOD BANK Performed at [facility], [place] [address], [place], [place] [address]   Glucose, capillary     Status: Abnormal    Collection Time: 01/25/19  6:05 AM  Result Value Ref Range   Glucose-Capillary 182 (H) 70 - 99 mg/dL  APTT     Status: Abnormal   Collection Time: 01/25/19  6:11 AM  Result Value Ref Range   aPTT 42 (H) 24 - 36 seconds    Comment:        IF BASELINE aPTT IS ELEVATED, SUGGEST PATIENT RISK ASSESSMENT BE USED TO DETERMINE APPROPRIATE ANTICOAGULANT THERAPY. Performed at [facility], [place] [address], [place], [place] [address]   Protime-INR     Status: Abnormal   Collection Time: 01/25/19  6:11 AM  Result Value Ref Range   Prothrombin Time 26.4 (H) 11.4 - 15.2 seconds   INR 2.4 (H) 0.8 - 1.2    Comment: (NOTE) INR goal varies based on device and disease states. Performed at [facility], [place] [address], [place], [place] [address]   Glucose, capillary     Status: Abnormal   Collection Time: 01/25/19  6:57 AM  Result Value Ref  Range   Glucose-Capillary 167 (H) 70 - 99 mg/dL  Glucose, capillary     Status: Abnormal   Collection Time: 01/25/19  8:10 AM  Result Value Ref Range   Glucose-Capillary 201 (H) 70 - 99 mg/dL  Lactic acid, plasma     Status: Abnormal   Collection Time: 01/25/19  8:11 AM  Result Value Ref Range   Lactic Acid, Venous 4.6 (HH) 0.5 - 1.9 mmol/L    Comment: CRITICAL VALUE NOTED.  VALUE IS CONSISTENT WITH PREVIOUSLY REPORTED AND CALLED VALUE. Performed at [facility], [place] [address], [place], [place] [address]   CBC     Status: Abnormal   Collection Time: 01/25/19  8:11 AM  Result Value Ref Range   WBC 27.1 (H) 4.0 - 10.5 K/uL   RBC 2.98 (L) 4.22 - 5.81 MIL/uL   Hemoglobin 9.1 (L) 13.0 - 17.0 g/dL   HCT 25.9 (L) 39.0 - 52.0 %   MCV 86.9 80.0 - 100.0 fL   MCH 30.5 26.0 - 34.0 pg   MCHC 35.1 30.0 - 36.0 g/dL   RDW 13.6 11.5 - 15.5 %   Platelets 70 (L) 150 - 400 K/uL    Comment: REPEATED TO VERIFY Immature Platelet Fraction may be clinically indicated, consider ordering this additional test [id number] CONSISTENT WITH PREVIOUS RESULT     nRBC 0.3 (H) 0.0 - 0.2 %    Comment: Performed at [facility], [place] [address] [place], [place] [address]  .Cooxemetry Panel (carboxy, met, total hgb, O2 sat)     Status: Abnormal   Collection Time: 01/25/19  9:16 AM  Result Value Ref Range   Total hemoglobin 8.8 (L) 12.0 - 16.0 g/dL   O2 Saturation 70.6 %   Carboxyhemoglobin 2.0 (H) 0.5 - 1.5 %   Methemoglobin 1.5 0.0 - 1.5 %    Comment: Performed at [facility] [address] [place], [place] [address]    Ct Head Wo Contrast  Result Date: 01/18/2019 CLINICAL DATA:  Encephalopathy EXAM: CT HEAD WITHOUT CONTRAST TECHNIQUE: Contiguous axial images were obtained from the base of the skull through the vertex without intravenous contrast. COMPARISON:  None. FINDINGS: Brain: There is no mass, hemorrhage or extra-axial collection. There is generalized atrophy without lobar predilection. Hypodensity of the white matter is most commonly associated with chronic microvascular disease. Vascular: No abnormal hyperdensity of the major intracranial arteries or dural venous sinuses. No intracranial atherosclerosis. Skull: Fluid levels in the frontal and maxillary sinuses. Moderate ethmoid sinus opacification. Sinuses/Orbits: No fluid levels or advanced mucosal thickening of the visualized paranasal sinuses. No mastoid or middle ear effusion. The orbits are normal. 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 Electronically Signed   By: Ulyses Jarred M.D.   On: 01/31/2019 23:32   Ct Angio Chest Pe W/cm &/or Wo Cm  Result Date: 01/31/2019 CLINICAL DATA:  Shortness of breath and recent CPR EXAM: CT ANGIOGRAPHY CHEST WITH CONTRAST TECHNIQUE: Multidetector CT imaging of the chest was performed using the standard protocol during bolus administration of intravenous contrast. Multiplanar CT image reconstructions and MIPs were obtained to evaluate the vascular anatomy. CONTRAST:  71m  OMNIPAQUE IOHEXOL 350 MG/ML SOLN COMPARISON:  Chest x-ray from earlier in the same day. FINDINGS: Cardiovascular: Thoracic aorta demonstrates mild atherosclerotic calcifications. No aneurysmal dilatation or dissection is seen. Pulmonary artery is well visualized within normal branching pattern. Large bilateral pulmonary emboli are identified with near complete loss of pulmonary arterial flow  on the right and significant decrease on the left. Changes consistent with right heart strain are noted. Mediastinum/Nodes: Thoracic inlet is within normal limits. No hilar or mediastinal adenopathy is noted. The esophagus as visualized is within normal limits. Endotracheal tube is noted in satisfactory position. Lungs/Pleura: Lungs are well aerated bilaterally. Patchy ground-glass infiltrate is noted in the left upper lobe in a perihilar distribution. No other focal infiltrate is seen. No sizable effusion or pneumothorax is noted. Upper Abdomen: Visualized upper abdomen shows gastric lap band in satisfactory position. No other focal abnormality is noted. Musculoskeletal: Degenerative changes of the thoracic spine are seen. Some undisplaced rib fractures are noted anteriorly involving the fourth, fifth and sixth ribs on the right. Review of the MIP images confirms the above findings. IMPRESSION: Massive bilateral central pulmonary emboli with significant decrease in pulmonary arterial flow. Right heart strain is noted. Patchy ground-glass infiltrate in the left upper lobe. Patient has negative COVID-19 status and this may represent acute pneumonic infiltrate or sequelae from the known pulmonary emboli. Undisplaced rib fractures on the right as described without pneumothorax. No other focal abnormality is noted. Aortic Atherosclerosis (ICD10-I70.0). Critical Value/emergent results were called by telephone at the time of interpretation on 01/17/2019 at 11:33 pm to Dr. Ralene Bathe, who verbally acknowledged these results. Electronically  Signed   By: Inez Catalina M.D.   On: 01/29/2019 23:41   Ir Angiogram Pulmonary Bilateral Selective  Result Date: 01/20/2019 INDICATION: Acute bilateral occlusive pulmonary emboli EXAM: Ultrasound guidance for vascular access Bilateral pulmonary artery catheterizations, angiograms, and insertion of infusion catheters for PE thrombolysis COMPARISON:  11/23/2018 MEDICATIONS: Patient is currently on ECMO ANESTHESIA/SEDATION: The patient was continuously monitored during the procedure by the ECMO team FLUOROSCOPY TIME:  Fluoroscopy Time: 14 minutes 12 seconds (822 mGy). COMPLICATIONS: None immediate. TECHNIQUE: Informed written consent was obtained from the patient's family after a thorough discussion of the procedural risks, benefits and alternatives. All questions were addressed. Maximal Sterile Barrier Technique was utilized including caps, mask, sterile gowns, sterile gloves, sterile drape, hand hygiene and skin antiseptic. A timeout was performed prior to the initiation of the procedure. Under sterile conditions, the existing 8 French sheath in the right internal jugular vein was exchanged over 2 guidewires. Two adjacent 6 French sheaths were inserted. Initially, a 6 French angle pigtail catheter was advanced through 1 of the 6 French sheaths into the right heart. This was advanced through the right heart outflow into the pulmonary arteries and initially position of the right pulmonary artery. Pulmonary angiogram performed. Initial pulmonary angiogram: Occlusive filling defects noted in the proximal right pulmonary artery. Nonocclusive filling defects in the left pulmonary artery. Over Rosen guidewire, a 10 cm infusion catheter was advanced into the right pulmonary artery extending into the lower lobe branches. Contrast injection confirms position. Inner occlusion wire advanced. Position confirmed with fluoroscopy. In a similar fashion through the second right IJ adjacent 6 French sheath, the angled 6 French  pigtail catheter was advanced and positioned in the right heart initially. This was advanced through the right heart outflow into the main pulmonary artery. Catheter was exchanged for a Kumpe catheter. Kumpe catheter was directed to the left pulmonary artery. Over the [facility] guidewire, a second 10 cm infusion catheter was advanced into the left pulmonary artery. Position confirmed with contrast injection. Inner occlusion wire advanced. Catheter positions confirmed with fluoroscopy. Access site secured to the right neck with a sterile dressing. PE thrombo lysis protocol will be initiated through both catheters for the standard  protocol infusion. FINDINGS: Bilateral pulmonary angiograms confirm occlusive central large clot burden in the right hilum. Nonocclusive thrombus in the central left pulmonary arteries. IMPRESSION: Successful bilateral pulmonary artery catheterizations, angiograms, and insertion of lysis catheters to begin PE thrombo lysis protocol. Electronically Signed   By: Jerilynn Mages.  Shick M.D.   On: 01/26/2019 20:48   Ir Angiogram Selective Each Additional Vessel  Result Date: 01/22/2019 INDICATION: Acute bilateral occlusive pulmonary emboli EXAM: Ultrasound guidance for vascular access Bilateral pulmonary artery catheterizations, angiograms, and insertion of infusion catheters for PE thrombolysis COMPARISON:  11/23/2018 MEDICATIONS: Patient is currently on ECMO ANESTHESIA/SEDATION: The patient was continuously monitored during the procedure by the ECMO team FLUOROSCOPY TIME:  Fluoroscopy Time: 14 minutes 12 seconds (822 mGy). COMPLICATIONS: None immediate. TECHNIQUE: Informed written consent was obtained from the patient's family after a thorough discussion of the procedural risks, benefits and alternatives. All questions were addressed. Maximal Sterile Barrier Technique was utilized including caps, mask, sterile gowns, sterile gloves, sterile drape, hand hygiene and skin antiseptic. A timeout was performed  prior to the initiation of the procedure. Under sterile conditions, the existing 8 French sheath in the right internal jugular vein was exchanged over 2 guidewires. Two adjacent 6 French sheaths were inserted. Initially, a 6 French angle pigtail catheter was advanced through 1 of the 6 French sheaths into the right heart. This was advanced through the right heart outflow into the pulmonary arteries and initially position of the right pulmonary artery. Pulmonary angiogram performed. Initial pulmonary angiogram: Occlusive filling defects noted in the proximal right pulmonary artery. Nonocclusive filling defects in the left pulmonary artery. Over Rosen guidewire, a 10 cm infusion catheter was advanced into the right pulmonary artery extending into the lower lobe branches. Contrast injection confirms position. Inner occlusion wire advanced. Position confirmed with fluoroscopy. In a similar fashion through the second right IJ adjacent 6 French sheath, the angled 6 French pigtail catheter was advanced and positioned in the right heart initially. This was advanced through the right heart outflow into the main pulmonary artery. Catheter was exchanged for a Kumpe catheter. Kumpe catheter was directed to the left pulmonary artery. Over the [facility] guidewire, a second 10 cm infusion catheter was advanced into the left pulmonary artery. Position confirmed with contrast injection. Inner occlusion wire advanced. Catheter positions confirmed with fluoroscopy. Access site secured to the right neck with a sterile dressing. PE thrombo lysis protocol will be initiated through both catheters for the standard protocol infusion. FINDINGS: Bilateral pulmonary angiograms confirm occlusive central large clot burden in the right hilum. Nonocclusive thrombus in the central left pulmonary arteries. IMPRESSION: Successful bilateral pulmonary artery catheterizations, angiograms, and insertion of lysis catheters to begin PE thrombo lysis protocol.  Electronically Signed   By: Jerilynn Mages.  Shick M.D.   On: 01/31/2019 20:48   Ir Angiogram Selective Each Additional Vessel  Result Date: 02/15/2019 INDICATION: Acute bilateral occlusive pulmonary emboli EXAM: Ultrasound guidance for vascular access Bilateral pulmonary artery catheterizations, angiograms, and insertion of infusion catheters for PE thrombolysis COMPARISON:  11/23/2018 MEDICATIONS: Patient is currently on ECMO ANESTHESIA/SEDATION: The patient was continuously monitored during the procedure by the ECMO team FLUOROSCOPY TIME:  Fluoroscopy Time: 14 minutes 12 seconds (822 mGy). COMPLICATIONS: None immediate. TECHNIQUE: Informed written consent was obtained from the patient's family after a thorough discussion of the procedural risks, benefits and alternatives. All questions were addressed. Maximal Sterile Barrier Technique was utilized including caps, mask, sterile gowns, sterile gloves, sterile drape, hand hygiene and skin antiseptic. A timeout was performed  prior to the initiation of the procedure. Under sterile conditions, the existing 8 French sheath in the right internal jugular vein was exchanged over 2 guidewires. Two adjacent 6 French sheaths were inserted. Initially, a 6 French angle pigtail catheter was advanced through 1 of the 6 French sheaths into the right heart. This was advanced through the right heart outflow into the pulmonary arteries and initially position of the right pulmonary artery. Pulmonary angiogram performed. Initial pulmonary angiogram: Occlusive filling defects noted in the proximal right pulmonary artery. Nonocclusive filling defects in the left pulmonary artery. Over Rosen guidewire, a 10 cm infusion catheter was advanced into the right pulmonary artery extending into the lower lobe branches. Contrast injection confirms position. Inner occlusion wire advanced. Position confirmed with fluoroscopy. In a similar fashion through the second right IJ adjacent 6 French sheath, the  angled 6 French pigtail catheter was advanced and positioned in the right heart initially. This was advanced through the right heart outflow into the main pulmonary artery. Catheter was exchanged for a Kumpe catheter. Kumpe catheter was directed to the left pulmonary artery. Over the [facility] guidewire, a second 10 cm infusion catheter was advanced into the left pulmonary artery. Position confirmed with contrast injection. Inner occlusion wire advanced. Catheter positions confirmed with fluoroscopy. Access site secured to the right neck with a sterile dressing. PE thrombo lysis protocol will be initiated through both catheters for the standard protocol infusion. FINDINGS: Bilateral pulmonary angiograms confirm occlusive central large clot burden in the right hilum. Nonocclusive thrombus in the central left pulmonary arteries. IMPRESSION: Successful bilateral pulmonary artery catheterizations, angiograms, and insertion of lysis catheters to begin PE thrombo lysis protocol. Electronically Signed   By: Jerilynn Mages.  Shick M.D.   On: 02/11/2019 20:48   Dg Chest Port 1 View  Result Date: 01/25/2019 CLINICAL DATA:  Endotracheal tube present. Patient on neck mass. EXAM: PORTABLE CHEST 1 VIEW COMPARISON:  Radiograph yesterday. FINDINGS: Endotracheal tube tip 2 cm from the carina. Bilateral pulmonary thrombolysis infusion catheters femoral right internal jugular approach unchanged in position from prior exam. Left internal jugular cannula in the region of the upper mediastinum, unchanged. Right upper extremity ECMO catheter in the region of the axilla. X catheter from an inferior approach in the atrial SVC junction. Unchanged heart size and mediastinal contours. Slight worsening retrocardiac opacity. No pneumothorax. No evidence of pleural effusion. IMPRESSION: 1. Support apparatus as described. 2. Slight worsening hazy opacity at the left lung base. Electronically Signed   By: Keith Rake M.D.   On: 01/25/2019 06:26   Dg  Chest Port 1 View  Result Date: 01/31/2019 CLINICAL DATA:  ECMO EXAM: PORTABLE CHEST 1 VIEW COMPARISON:  January 24, 2019 FINDINGS: The endotracheal tube terminates above the carina by approximately 3.6 cm. There are bilateral pulmonary thrombolysis infusion catheters. A left-sided central venous catheter is noted. The heart size is enlarged. Bilateral airspace opacities are again noted. A stent is partially visualized projecting over the patient's right axillary region. IMPRESSION: 1. Lines and tubes as above. The bilateral UniFuse catheters appear to be relatively stable when compared to postprocedural imaging earlier in the day. 2. Persistent patchy bilateral airspace opacities as seen on prior CT. Electronically Signed   By: Constance Holster M.D.   On: 01/21/2019 20:49   Dg Chest Portable 1 View  Result Date: 01/18/2019 CLINICAL DATA:  ECMO EXAM: PORTABLE CHEST 1 VIEW COMPARISON:  Portable exam 1702 hours compared to 02/11/2019 FINDINGS: Tip of endotracheal tube projects approximately 3.1 cm above carina.  Large-bore catheter projects over SVC, RIGHT atrium and IVC. BILATERAL jugular lines. RIGHT upper extremity line versus graft. Stable heart size. Atelectasis versus infiltrate LEFT upper lobe. Remaining lungs clear. No pleural effusion or pneumothorax. IMPRESSION: LEFT upper lobe atelectasis versus consolidation. Lines and tubes as above. Electronically Signed   By: Lavonia Dana M.D.   On: 01/22/2019 17:19   Dg Chest Portable 1 View  Result Date: 02/10/2019 CLINICAL DATA:  Status post intubation, recent CPR EXAM: PORTABLE CHEST 1 VIEW COMPARISON:  Film from earlier in the same day. FINDINGS: Patchy opacity is again noted in the left mid lung stable from the prior exam. Lungs are otherwise clear. Endotracheal tube is noted in satisfactory position. A rounded metallic artifact is noted overlying the upper chest related to patient's hand. No other focal abnormality is noted. IMPRESSION: Stable opacity  over the left lung. Endotracheal tube in satisfactory position. Extrinsic artifact over the upper chest. Electronically Signed   By: Inez Catalina M.D.   On: 01/20/2019 20:52   Dg Chest Portable 1 View  Result Date: 02/09/2019 CLINICAL DATA:  Shortness of breath increasing over the past few hours EXAM: PORTABLE CHEST 1 VIEW COMPARISON:  01/27/2007 FINDINGS: Cardiac shadow is stable. Tortuosity of the thoracic aorta is again seen. Patchy left perihilar infiltrate is noted. No sizable effusion is seen. Old rib fractures on the right are seen. IMPRESSION: Patchy left perihilar infiltrate. Followup films following appropriate therapy are recommended. Electronically Signed   By: Inez Catalina M.D.   On: 02/14/2019 17:08   Dg C-arm 1-60 Min-no Report  Result Date: 02/07/2019 Fluoroscopy was utilized by the requesting physician.  No radiographic interpretation.   Ir Infusion Thrombol Arterial Initial (ms)  Result Date: 01/20/2019 INDICATION: Acute bilateral occlusive pulmonary emboli EXAM: Ultrasound guidance for vascular access Bilateral pulmonary artery catheterizations, angiograms, and insertion of infusion catheters for PE thrombolysis COMPARISON:  11/23/2018 MEDICATIONS: Patient is currently on ECMO ANESTHESIA/SEDATION: The patient was continuously monitored during the procedure by the ECMO team FLUOROSCOPY TIME:  Fluoroscopy Time: 14 minutes 12 seconds (822 mGy). COMPLICATIONS: None immediate. TECHNIQUE: Informed written consent was obtained from the patient's family after a thorough discussion of the procedural risks, benefits and alternatives. All questions were addressed. Maximal Sterile Barrier Technique was utilized including caps, mask, sterile gowns, sterile gloves, sterile drape, hand hygiene and skin antiseptic. A timeout was performed prior to the initiation of the procedure. Under sterile conditions, the existing 8 French sheath in the right internal jugular vein was exchanged over 2 guidewires.  Two adjacent 6 French sheaths were inserted. Initially, a 6 French angle pigtail catheter was advanced through 1 of the 6 French sheaths into the right heart. This was advanced through the right heart outflow into the pulmonary arteries and initially position of the right pulmonary artery. Pulmonary angiogram performed. Initial pulmonary angiogram: Occlusive filling defects noted in the proximal right pulmonary artery. Nonocclusive filling defects in the left pulmonary artery. Over Rosen guidewire, a 10 cm infusion catheter was advanced into the right pulmonary artery extending into the lower lobe branches. Contrast injection confirms position. Inner occlusion wire advanced. Position confirmed with fluoroscopy. In a similar fashion through the second right IJ adjacent 6 French sheath, the angled 6 French pigtail catheter was advanced and positioned in the right heart initially. This was advanced through the right heart outflow into the main pulmonary artery. Catheter was exchanged for a Kumpe catheter. Kumpe catheter was directed to the left pulmonary artery. Over the [facility] guidewire, a  second 10 cm infusion catheter was advanced into the left pulmonary artery. Position confirmed with contrast injection. Inner occlusion wire advanced. Catheter positions confirmed with fluoroscopy. Access site secured to the right neck with a sterile dressing. PE thrombo lysis protocol will be initiated through both catheters for the standard protocol infusion. FINDINGS: Bilateral pulmonary angiograms confirm occlusive central large clot burden in the right hilum. Nonocclusive thrombus in the central left pulmonary arteries. IMPRESSION: Successful bilateral pulmonary artery catheterizations, angiograms, and insertion of lysis catheters to begin PE thrombo lysis protocol. Electronically Signed   By: Jerilynn Mages.  Shick M.D.   On: 01/28/2019 20:48   Ir Infusion Thrombol Arterial Initial (ms)  Result Date: 02/10/2019 INDICATION: Acute  bilateral occlusive pulmonary emboli EXAM: Ultrasound guidance for vascular access Bilateral pulmonary artery catheterizations, angiograms, and insertion of infusion catheters for PE thrombolysis COMPARISON:  11/23/2018 MEDICATIONS: Patient is currently on ECMO ANESTHESIA/SEDATION: The patient was continuously monitored during the procedure by the ECMO team FLUOROSCOPY TIME:  Fluoroscopy Time: 14 minutes 12 seconds (822 mGy). COMPLICATIONS: None immediate. TECHNIQUE: Informed written consent was obtained from the patient's family after a thorough discussion of the procedural risks, benefits and alternatives. All questions were addressed. Maximal Sterile Barrier Technique was utilized including caps, mask, sterile gowns, sterile gloves, sterile drape, hand hygiene and skin antiseptic. A timeout was performed prior to the initiation of the procedure. Under sterile conditions, the existing 8 French sheath in the right internal jugular vein was exchanged over 2 guidewires. Two adjacent 6 French sheaths were inserted. Initially, a 6 French angle pigtail catheter was advanced through 1 of the 6 French sheaths into the right heart. This was advanced through the right heart outflow into the pulmonary arteries and initially position of the right pulmonary artery. Pulmonary angiogram performed. Initial pulmonary angiogram: Occlusive filling defects noted in the proximal right pulmonary artery. Nonocclusive filling defects in the left pulmonary artery. Over Rosen guidewire, a 10 cm infusion catheter was advanced into the right pulmonary artery extending into the lower lobe branches. Contrast injection confirms position. Inner occlusion wire advanced. Position confirmed with fluoroscopy. In a similar fashion through the second right IJ adjacent 6 French sheath, the angled 6 French pigtail catheter was advanced and positioned in the right heart initially. This was advanced through the right heart outflow into the main pulmonary  artery. Catheter was exchanged for a Kumpe catheter. Kumpe catheter was directed to the left pulmonary artery. Over the [facility] guidewire, a second 10 cm infusion catheter was advanced into the left pulmonary artery. Position confirmed with contrast injection. Inner occlusion wire advanced. Catheter positions confirmed with fluoroscopy. Access site secured to the right neck with a sterile dressing. PE thrombo lysis protocol will be initiated through both catheters for the standard protocol infusion. FINDINGS: Bilateral pulmonary angiograms confirm occlusive central large clot burden in the right hilum. Nonocclusive thrombus in the central left pulmonary arteries. IMPRESSION: Successful bilateral pulmonary artery catheterizations, angiograms, and insertion of lysis catheters to begin PE thrombo lysis protocol. Electronically Signed   By: Jerilynn Mages.  Shick M.D.   On: 01/29/2019 20:48   Review of Systems: Unable to perform. Pt is intubated.  Blood pressure 98/75, pulse 86, temperature 99.9 F (37.7 C), resp. rate 11, height 5' 9.02" (1.753 m), weight (!) 148.2 kg, SpO2 100 %. General appearance: morbidly obese and intubated. Sedated. Not responsive. Head: Normocephalic, without obvious abnormality, atraumatic Ears: Examination of the ears shows normal auricles and external auditory canals bilaterally.  Nose: Nasal examination shows blood clots  in both nasal cavities. Blood is noted to drain into the pharynx. Face: Facial examination shows no asymmetry.  Mouth: ET tube in place. A large amount of blood clots in the pharynx. Neck: Palpation of the neck reveals no lymphadenopathy or mass. The trachea is midline. The thyroid is not significantly enlarged.  Neuro: Sedated. Not responsive.  Procedure: Bilateral anterior/posterior nasal packing for control of epistaxis. Anesthesia: None. Pt is sedated. Description: The patient is located in the ICU bed.  Blood clot is suctions from both nasal cavities and the pharynx.  Bleeding is noted from both nasal cavities. A 10 cm Merocel packing is placed in the right nasal cavity with good hemostasis. Another 10 cm Merocel packing is placed in the left nasal cavity  The patient tolerated the procedure well.  Assessment/Plan: Bilateral severe epistaxis s/p TPA treatment for massive pulmonary emboli. - Bilateral merocel packing is placed. - Will leave packing in place for 5 days. - Continue current antibiotic coverage.  Jerris Keltz W [place] Burgin 01/25/2019, 9:41 AM

## 2019-01-25 NOTE — Progress Notes (Signed)
Discussed POC and all labs with Dr. Prescott Gum throughout the night. Versed infusion added for increased sedation needs and to maintain goal flows on ECLS. Pt has had significant bright red oral bleeding tonight, approx 933mL thus far. MD ordered 3 total RBC to be transfused to maintain hgb >8.5. OGT removed at the beginning of the shift d/t misplacement and not to be replaced because of TPA per MD. Electrolyte replacement given as ordered. Bicarb infusion stopped per ABG results.  Flows on ECMO have maintained approx 4.5 all night, venous -90, and delta P at 20. Pt remains on high dose vasopressors but decreasing in dose. Plan to get levo off first, then vaso, then epi per Dr. Prescott Gum.

## 2019-01-25 NOTE — Progress Notes (Signed)
1 Day Post-Op Procedure(s) (LRB): CANNULATION FOR VA ECMO (EXTRACORPOREAL MEMBRANE OXYGENATION) (N/A) TRANSESOPHAGEAL ECHOCARDIOGRAM (TEE) (N/A) Subjective: Stable flows on VA ECMO Received 3 u PRBC for bleeding from mouthprobably from misplaced OG tube which was kinked and removed  Epi weaned by half Urine output improved +doppler pulses in exrtemities  Objective: Vital signs in last 24 hours: Temp:  [98.2 F (36.8 C)-101.5 F (38.6 C)] 99.7 F (37.6 C) (12/09 0800) Pulse Rate:  [81-105] 81 (12/09 0800) Cardiac Rhythm: Normal sinus rhythm (12/09 0800) Resp:  [0-35] 9 (12/09 0800) BP: (83-115)/(49-82) 98/75 (12/09 0743) SpO2:  [100 %] 100 % (12/09 0800) Arterial Line BP: (72-133)/(50-102) 91/76 (12/09 0800) FiO2 (%):  [40 %-100 %] 40 % (12/09 0800) Weight:  [146.5 kg-148.2 kg] 148.2 kg (12/09 0500)  Hemodynamic parameters for last 24 hours: CVP:  [0 mmHg-7 mmHg] 7 mmHg  Intake/Output from previous day: 12/08 0701 - 12/09 0700 In: 8807.9 [I.V.:4604.2; MHDQQ:2297; IV Piggyback:2511.7] Out: 9892 [JJHER:7408; Blood:700] Intake/Output this shift: Total I/O In: 465.3 [I.V.:190.7; IV Piggyback:274.6] Out: 100 [Urine:100]       Exam    General- sedated  and comfortable    Neck- no JVD, no cervical adenopathy palpable, no carotid bruit   Lungs- clear without rales, wheezes   Cor- regular rate and rhythm, no murmur , gallop   Abdomen- soft, non-tender   Extremities - warm, non-tender, minimal edema- no hematoma or bleeding from cannula sites   Neuro- sedated, no focal weakness when he became agitated   Lab Results: Recent Labs    01/25/19 0154 01/25/19 0504 01/25/19 0811  WBC 27.7*  --  27.1*  HGB 7.5* 7.5* 9.1*  HCT 22.3* 22.0* 25.9*  PLT 105*  --  70*   BMET:  Recent Labs    02/14/2019 2059 01/25/19 0200 01/25/19 0504  NA 142 142 141  K 2.8* 3.3* 3.5  CL 105 107  --   CO2 25 25  --   GLUCOSE 280* 255*  --   BUN 55* 55*  --   CREATININE 2.32* 1.91*  --    CALCIUM 8.0* 7.0*  --     PT/INR:  Recent Labs    01/25/19 0611  LABPROT 26.4*  INR 2.4*   ABG    Component Value Date/Time   PHART 7.463 (H) 01/25/2019 0504   HCO3 27.3 01/25/2019 0504   TCO2 28 01/25/2019 0504   ACIDBASEDEF 1.0 01/21/2019 1657   O2SAT 100.0 01/25/2019 0504   CBG (last 3)  Recent Labs    01/25/19 0501 01/25/19 0605 01/25/19 0657  GLUCAP 226* 182* 167*    Assessment/Plan: S/P Procedure(s) (LRB): CANNULATION FOR VA ECMO (EXTRACORPOREAL MEMBRANE OXYGENATION) (N/A) TRANSESOPHAGEAL ECHOCARDIOGRAM (TEE) (N/A) Maintain PTT 60-90 Keep Hb > 8.0 Keep sedated Pack L nares- possible ENT consult Wean epi to 20 mcg if possible Echo bedside to eval RV fx   LOS: 2 days    Tharon Aquas Trigt III 01/25/2019

## 2019-01-25 NOTE — Progress Notes (Addendum)
Patient ID: Philip Wolfe, male   DOB: 09-06-1950, 68 y.o.   MRN: [id number]     Advanced Heart Failure Rounding Note  PCP-Cardiologist: No primary care provider on file.   Subjective:    Massive PE with cardiogenic shock: [facility] ECMO initiated 12/8. Patient then went to IR and has ongoing PA catheter-directed tPA.    Overnight, extensive nasopharyngeal bleeding from OGT, had 3 units PRBCs. Hgb 9.9 this morning.   LDH 293 this morning, lactate still 4 but downtrending.   Pressors trending down, now on norepinephrine 10, epinephrine 36, vasopressin 0.04.  MAP 80s.  CVP 8 with good UOP.  Heparin gtt ongoing. iNO at 20 ppm.   ECMO:  Flow 4.4 L/min  3600 rpm Venous pressure -90 Delta P 20  Objective:   Weight Range: (!) 148.2 kg Body mass index is 48.23 kg/m.   Vital Signs:   Temp:  [98.2 F (36.8 C)-101.5 F (38.6 C)] 99.5 F (37.5 C) (12/09 0700) Pulse Rate:  [84-105] 84 (12/09 0700) Resp:  [0-35] 13 (12/09 0700) BP: (83-115)/(49-82) 91/74 (12/09 0342) SpO2:  [100 %] 100 % (12/09 0700) Arterial Line BP: (72-133)/(50-102) 105/77 (12/09 0700) FiO2 (%):  [40 %-100 %] 40 % (12/09 0400) Weight:  [146.5 kg-148.2 kg] 148.2 kg (12/09 0500) Last BM Date: (PTA)  Weight change: Filed Weights   01/25/2019 0500 02/04/2019 1100 01/25/19 0500  Weight: (!) 146.5 kg (!) 146.5 kg (!) 148.2 kg    Intake/Output:   Intake/Output Summary (Last 24 hours) at 01/25/2019 0724 Last data filed at 01/25/2019 0700 Gross per 24 hour  Intake 8807.9 ml  Output 3265 ml  Net 5542.9 ml      Physical Exam    General:  Intubated/sedated HEENT: nasopharyngeal bleeding Neck: Thick, unable to assess JVD. Carotids 2+ bilat; no bruits. No lymphadenopathy or thyromegaly appreciated. Cor: PMI nonpalpable. Regular rate & rhythm. No rubs, gallops or murmurs. Lungs: Decreased at bases.  Abdomen: Soft, nontender, nondistended. No hepatosplenomegaly. No bruits or masses. Good bowel sounds. Extremities: No  cyanosis, clubbing, rash. 1+ edema 1/2 up lower legs Neuro: Alert & orientedx3, cranial nerves grossly intact. moves all 4 extremities w/o difficulty. Affect pleasant   Telemetry   NSR 83 (personally reviewed)  Labs    CBC Recent Labs    01/22/2019 1504  02/10/2019 2059 01/25/19 0154 01/25/19 0504  WBC 15.6*   < > 23.1* 27.7*  --   NEUTROABS 10.6*  --   --   --   --   HGB 16.6   < > 8.1* 7.5* 7.5*  HCT 52.0   < > 23.7* 22.3* 22.0*  MCV 94.2   < > 90.1 90.7  --   PLT 242   < > 122* 105*  --    < > = values in this interval not displayed.   Basic Metabolic Panel Recent Labs    02/12/2019 0454  02/10/2019 2059 01/25/19 0200 01/25/19 0504  NA 144   < > 142 142 141  K 3.4*   < > 2.8* 3.3* 3.5  CL 104   < > 105 107  --   CO2 19*  --  25 25  --   GLUCOSE 390*   < > 280* 255*  --   BUN 54*   < > 55* 55*  --   CREATININE 2.33*   < > 2.32* 1.91*  --   CALCIUM 7.6*  --  8.0* 7.0*  --   MG 2.1  --   --  1.5*  --   PHOS 9.1*  --   --   --   --    < > = values in this interval not displayed.   Liver Function Tests Recent Labs    02/04/2019 1504  AST 42*  ALT 39  ALKPHOS 91  BILITOT 0.4  PROT 8.1  ALBUMIN 4.0   No results for input(s): LIPASE, AMYLASE in the last 72 hours. Cardiac Enzymes No results for input(s): CKTOTAL, CKMB, CKMBINDEX, TROPONINI in the last 72 hours.  BNP: BNP (last 3 results) Recent Labs    02/07/2019 1505  BNP 57.6    ProBNP (last 3 results) No results for input(s): PROBNP in the last 8760 hours.   D-Dimer Recent Labs    01/21/2019 1613  DDIMER >20.00*   Hemoglobin A1C Recent Labs    01/22/2019 0454  HGBA1C 6.3*   Fasting Lipid Panel Recent Labs    01/21/2019 0454  TRIG 224*   Thyroid Function Tests No results for input(s): TSH, T4TOTAL, T3FREE, THYROIDAB in the last 72 hours.  Invalid input(s): FREET3  Other results:   Imaging    Ir Angiogram Pulmonary Bilateral Selective  Result Date: 02/15/2019 INDICATION: Acute bilateral  occlusive pulmonary emboli EXAM: Ultrasound guidance for vascular access Bilateral pulmonary artery catheterizations, angiograms, and insertion of infusion catheters for PE thrombolysis COMPARISON:  11/23/2018 MEDICATIONS: Patient is currently on ECMO ANESTHESIA/SEDATION: The patient was continuously monitored during the procedure by the ECMO team FLUOROSCOPY TIME:  Fluoroscopy Time: 14 minutes 12 seconds (822 mGy). COMPLICATIONS: None immediate. TECHNIQUE: Informed written consent was obtained from the patient's family after a thorough discussion of the procedural risks, benefits and alternatives. All questions were addressed. Maximal Sterile Barrier Technique was utilized including caps, mask, sterile gowns, sterile gloves, sterile drape, hand hygiene and skin antiseptic. A timeout was performed prior to the initiation of the procedure. Under sterile conditions, the existing 8 French sheath in the right internal jugular vein was exchanged over 2 guidewires. Two adjacent 6 French sheaths were inserted. Initially, a 6 French angle pigtail catheter was advanced through 1 of the 6 French sheaths into the right heart. This was advanced through the right heart outflow into the pulmonary arteries and initially position of the right pulmonary artery. Pulmonary angiogram performed. Initial pulmonary angiogram: Occlusive filling defects noted in the proximal right pulmonary artery. Nonocclusive filling defects in the left pulmonary artery. Over Rosen guidewire, a 10 cm infusion catheter was advanced into the right pulmonary artery extending into the lower lobe branches. Contrast injection confirms position. Inner occlusion wire advanced. Position confirmed with fluoroscopy. In a similar fashion through the second right IJ adjacent 6 French sheath, the angled 6 French pigtail catheter was advanced and positioned in the right heart initially. This was advanced through the right heart outflow into the main pulmonary artery.  Catheter was exchanged for a Kumpe catheter. Kumpe catheter was directed to the left pulmonary artery. Over the [facility] guidewire, a second 10 cm infusion catheter was advanced into the left pulmonary artery. Position confirmed with contrast injection. Inner occlusion wire advanced. Catheter positions confirmed with fluoroscopy. Access site secured to the right neck with a sterile dressing. PE thrombo lysis protocol will be initiated through both catheters for the standard protocol infusion. FINDINGS: Bilateral pulmonary angiograms confirm occlusive central large clot burden in the right hilum. Nonocclusive thrombus in the central left pulmonary arteries. IMPRESSION: Successful bilateral pulmonary artery catheterizations, angiograms, and insertion of lysis catheters to begin PE thrombo lysis protocol. Electronically  Signed   By: Jerilynn Mages.  Shick M.D.   On: 02/04/2019 20:48   Ir Angiogram Selective Each Additional Vessel  Result Date: 02/04/2019 INDICATION: Acute bilateral occlusive pulmonary emboli EXAM: Ultrasound guidance for vascular access Bilateral pulmonary artery catheterizations, angiograms, and insertion of infusion catheters for PE thrombolysis COMPARISON:  11/23/2018 MEDICATIONS: Patient is currently on ECMO ANESTHESIA/SEDATION: The patient was continuously monitored during the procedure by the ECMO team FLUOROSCOPY TIME:  Fluoroscopy Time: 14 minutes 12 seconds (822 mGy). COMPLICATIONS: None immediate. TECHNIQUE: Informed written consent was obtained from the patient's family after a thorough discussion of the procedural risks, benefits and alternatives. All questions were addressed. Maximal Sterile Barrier Technique was utilized including caps, mask, sterile gowns, sterile gloves, sterile drape, hand hygiene and skin antiseptic. A timeout was performed prior to the initiation of the procedure. Under sterile conditions, the existing 8 French sheath in the right internal jugular vein was exchanged over 2  guidewires. Two adjacent 6 French sheaths were inserted. Initially, a 6 French angle pigtail catheter was advanced through 1 of the 6 French sheaths into the right heart. This was advanced through the right heart outflow into the pulmonary arteries and initially position of the right pulmonary artery. Pulmonary angiogram performed. Initial pulmonary angiogram: Occlusive filling defects noted in the proximal right pulmonary artery. Nonocclusive filling defects in the left pulmonary artery. Over Rosen guidewire, a 10 cm infusion catheter was advanced into the right pulmonary artery extending into the lower lobe branches. Contrast injection confirms position. Inner occlusion wire advanced. Position confirmed with fluoroscopy. In a similar fashion through the second right IJ adjacent 6 French sheath, the angled 6 French pigtail catheter was advanced and positioned in the right heart initially. This was advanced through the right heart outflow into the main pulmonary artery. Catheter was exchanged for a Kumpe catheter. Kumpe catheter was directed to the left pulmonary artery. Over the [facility] guidewire, a second 10 cm infusion catheter was advanced into the left pulmonary artery. Position confirmed with contrast injection. Inner occlusion wire advanced. Catheter positions confirmed with fluoroscopy. Access site secured to the right neck with a sterile dressing. PE thrombo lysis protocol will be initiated through both catheters for the standard protocol infusion. FINDINGS: Bilateral pulmonary angiograms confirm occlusive central large clot burden in the right hilum. Nonocclusive thrombus in the central left pulmonary arteries. IMPRESSION: Successful bilateral pulmonary artery catheterizations, angiograms, and insertion of lysis catheters to begin PE thrombo lysis protocol. Electronically Signed   By: Jerilynn Mages.  Shick M.D.   On: 01/20/2019 20:48   Ir Angiogram Selective Each Additional Vessel  Result Date:  01/21/2019 INDICATION: Acute bilateral occlusive pulmonary emboli EXAM: Ultrasound guidance for vascular access Bilateral pulmonary artery catheterizations, angiograms, and insertion of infusion catheters for PE thrombolysis COMPARISON:  11/23/2018 MEDICATIONS: Patient is currently on ECMO ANESTHESIA/SEDATION: The patient was continuously monitored during the procedure by the ECMO team FLUOROSCOPY TIME:  Fluoroscopy Time: 14 minutes 12 seconds (822 mGy). COMPLICATIONS: None immediate. TECHNIQUE: Informed written consent was obtained from the patient's family after a thorough discussion of the procedural risks, benefits and alternatives. All questions were addressed. Maximal Sterile Barrier Technique was utilized including caps, mask, sterile gowns, sterile gloves, sterile drape, hand hygiene and skin antiseptic. A timeout was performed prior to the initiation of the procedure. Under sterile conditions, the existing 8 French sheath in the right internal jugular vein was exchanged over 2 guidewires. Two adjacent 6 French sheaths were inserted. Initially, a 6 French angle pigtail catheter was advanced through  1 of the 6 French sheaths into the right heart. This was advanced through the right heart outflow into the pulmonary arteries and initially position of the right pulmonary artery. Pulmonary angiogram performed. Initial pulmonary angiogram: Occlusive filling defects noted in the proximal right pulmonary artery. Nonocclusive filling defects in the left pulmonary artery. Over Rosen guidewire, a 10 cm infusion catheter was advanced into the right pulmonary artery extending into the lower lobe branches. Contrast injection confirms position. Inner occlusion wire advanced. Position confirmed with fluoroscopy. In a similar fashion through the second right IJ adjacent 6 French sheath, the angled 6 French pigtail catheter was advanced and positioned in the right heart initially. This was advanced through the right heart  outflow into the main pulmonary artery. Catheter was exchanged for a Kumpe catheter. Kumpe catheter was directed to the left pulmonary artery. Over the [facility] guidewire, a second 10 cm infusion catheter was advanced into the left pulmonary artery. Position confirmed with contrast injection. Inner occlusion wire advanced. Catheter positions confirmed with fluoroscopy. Access site secured to the right neck with a sterile dressing. PE thrombo lysis protocol will be initiated through both catheters for the standard protocol infusion. FINDINGS: Bilateral pulmonary angiograms confirm occlusive central large clot burden in the right hilum. Nonocclusive thrombus in the central left pulmonary arteries. IMPRESSION: Successful bilateral pulmonary artery catheterizations, angiograms, and insertion of lysis catheters to begin PE thrombo lysis protocol. Electronically Signed   By: Jerilynn Mages.  Shick M.D.   On: 02/06/2019 20:48   Dg Chest Port 1 View  Result Date: 01/25/2019 CLINICAL DATA:  Endotracheal tube present. Patient on neck mass. EXAM: PORTABLE CHEST 1 VIEW COMPARISON:  Radiograph yesterday. FINDINGS: Endotracheal tube tip 2 cm from the carina. Bilateral pulmonary thrombolysis infusion catheters femoral right internal jugular approach unchanged in position from prior exam. Left internal jugular cannula in the region of the upper mediastinum, unchanged. Right upper extremity ECMO catheter in the region of the axilla. X catheter from an inferior approach in the atrial SVC junction. Unchanged heart size and mediastinal contours. Slight worsening retrocardiac opacity. No pneumothorax. No evidence of pleural effusion. IMPRESSION: 1. Support apparatus as described. 2. Slight worsening hazy opacity at the left lung base. Electronically Signed   By: Keith Rake M.D.   On: 01/25/2019 06:26   Dg Chest Port 1 View  Result Date: 01/21/2019 CLINICAL DATA:  ECMO EXAM: PORTABLE CHEST 1 VIEW COMPARISON:  January 24, 2019 FINDINGS: The  endotracheal tube terminates above the carina by approximately 3.6 cm. There are bilateral pulmonary thrombolysis infusion catheters. A left-sided central venous catheter is noted. The heart size is enlarged. Bilateral airspace opacities are again noted. A stent is partially visualized projecting over the patient's right axillary region. IMPRESSION: 1. Lines and tubes as above. The bilateral UniFuse catheters appear to be relatively stable when compared to postprocedural imaging earlier in the day. 2. Persistent patchy bilateral airspace opacities as seen on prior CT. Electronically Signed   By: Constance Holster M.D.   On: 02/05/2019 20:49   Dg Chest Portable 1 View  Result Date: 01/28/2019 CLINICAL DATA:  ECMO EXAM: PORTABLE CHEST 1 VIEW COMPARISON:  Portable exam 1702 hours compared to 01/31/2019 FINDINGS: Tip of endotracheal tube projects approximately 3.1 cm above carina. Large-bore catheter projects over SVC, RIGHT atrium and IVC. BILATERAL jugular lines. RIGHT upper extremity line versus graft. Stable heart size. Atelectasis versus infiltrate LEFT upper lobe. Remaining lungs clear. No pleural effusion or pneumothorax. IMPRESSION: LEFT upper lobe atelectasis versus consolidation. Lines  and tubes as above. Electronically Signed   By: Lavonia Dana M.D.   On: 02/06/2019 17:19   Dg C-arm 1-60 Min-no Report  Result Date: 02/06/2019 Fluoroscopy was utilized by the requesting physician.  No radiographic interpretation.   Ir Infusion Thrombol Arterial Initial (ms)  Result Date: 01/31/2019 INDICATION: Acute bilateral occlusive pulmonary emboli EXAM: Ultrasound guidance for vascular access Bilateral pulmonary artery catheterizations, angiograms, and insertion of infusion catheters for PE thrombolysis COMPARISON:  11/23/2018 MEDICATIONS: Patient is currently on ECMO ANESTHESIA/SEDATION: The patient was continuously monitored during the procedure by the ECMO team FLUOROSCOPY TIME:  Fluoroscopy Time: 14 minutes  12 seconds (822 mGy). COMPLICATIONS: None immediate. TECHNIQUE: Informed written consent was obtained from the patient's family after a thorough discussion of the procedural risks, benefits and alternatives. All questions were addressed. Maximal Sterile Barrier Technique was utilized including caps, mask, sterile gowns, sterile gloves, sterile drape, hand hygiene and skin antiseptic. A timeout was performed prior to the initiation of the procedure. Under sterile conditions, the existing 8 French sheath in the right internal jugular vein was exchanged over 2 guidewires. Two adjacent 6 French sheaths were inserted. Initially, a 6 French angle pigtail catheter was advanced through 1 of the 6 French sheaths into the right heart. This was advanced through the right heart outflow into the pulmonary arteries and initially position of the right pulmonary artery. Pulmonary angiogram performed. Initial pulmonary angiogram: Occlusive filling defects noted in the proximal right pulmonary artery. Nonocclusive filling defects in the left pulmonary artery. Over Rosen guidewire, a 10 cm infusion catheter was advanced into the right pulmonary artery extending into the lower lobe branches. Contrast injection confirms position. Inner occlusion wire advanced. Position confirmed with fluoroscopy. In a similar fashion through the second right IJ adjacent 6 French sheath, the angled 6 French pigtail catheter was advanced and positioned in the right heart initially. This was advanced through the right heart outflow into the main pulmonary artery. Catheter was exchanged for a Kumpe catheter. Kumpe catheter was directed to the left pulmonary artery. Over the [facility] guidewire, a second 10 cm infusion catheter was advanced into the left pulmonary artery. Position confirmed with contrast injection. Inner occlusion wire advanced. Catheter positions confirmed with fluoroscopy. Access site secured to the right neck with a sterile dressing. PE  thrombo lysis protocol will be initiated through both catheters for the standard protocol infusion. FINDINGS: Bilateral pulmonary angiograms confirm occlusive central large clot burden in the right hilum. Nonocclusive thrombus in the central left pulmonary arteries. IMPRESSION: Successful bilateral pulmonary artery catheterizations, angiograms, and insertion of lysis catheters to begin PE thrombo lysis protocol. Electronically Signed   By: Jerilynn Mages.  Shick M.D.   On: 01/22/2019 20:48   Ir Infusion Thrombol Arterial Initial (ms)  Result Date: 01/29/2019 INDICATION: Acute bilateral occlusive pulmonary emboli EXAM: Ultrasound guidance for vascular access Bilateral pulmonary artery catheterizations, angiograms, and insertion of infusion catheters for PE thrombolysis COMPARISON:  11/23/2018 MEDICATIONS: Patient is currently on ECMO ANESTHESIA/SEDATION: The patient was continuously monitored during the procedure by the ECMO team FLUOROSCOPY TIME:  Fluoroscopy Time: 14 minutes 12 seconds (822 mGy). COMPLICATIONS: None immediate. TECHNIQUE: Informed written consent was obtained from the patient's family after a thorough discussion of the procedural risks, benefits and alternatives. All questions were addressed. Maximal Sterile Barrier Technique was utilized including caps, mask, sterile gowns, sterile gloves, sterile drape, hand hygiene and skin antiseptic. A timeout was performed prior to the initiation of the procedure. Under sterile conditions, the existing 8 [place] sheath in  the right internal jugular vein was exchanged over 2 guidewires. Two adjacent 6 French sheaths were inserted. Initially, a 6 French angle pigtail catheter was advanced through 1 of the 6 French sheaths into the right heart. This was advanced through the right heart outflow into the pulmonary arteries and initially position of the right pulmonary artery. Pulmonary angiogram performed. Initial pulmonary angiogram: Occlusive filling defects noted in the  proximal right pulmonary artery. Nonocclusive filling defects in the left pulmonary artery. Over Rosen guidewire, a 10 cm infusion catheter was advanced into the right pulmonary artery extending into the lower lobe branches. Contrast injection confirms position. Inner occlusion wire advanced. Position confirmed with fluoroscopy. In a similar fashion through the second right IJ adjacent 6 French sheath, the angled 6 French pigtail catheter was advanced and positioned in the right heart initially. This was advanced through the right heart outflow into the main pulmonary artery. Catheter was exchanged for a Kumpe catheter. Kumpe catheter was directed to the left pulmonary artery. Over the [facility] guidewire, a second 10 cm infusion catheter was advanced into the left pulmonary artery. Position confirmed with contrast injection. Inner occlusion wire advanced. Catheter positions confirmed with fluoroscopy. Access site secured to the right neck with a sterile dressing. PE thrombo lysis protocol will be initiated through both catheters for the standard protocol infusion. FINDINGS: Bilateral pulmonary angiograms confirm occlusive central large clot burden in the right hilum. Nonocclusive thrombus in the central left pulmonary arteries. IMPRESSION: Successful bilateral pulmonary artery catheterizations, angiograms, and insertion of lysis catheters to begin PE thrombo lysis protocol. Electronically Signed   By: Jerilynn Mages.  Shick M.D.   On: 01/22/2019 20:48      Medications:     Scheduled Medications:  sodium chloride   Intravenous Once   acetaminophen  1,000 mg Oral Q6H   Or   acetaminophen (TYLENOL) oral liquid 160 mg/5 mL  1,000 mg Per Tube Q6H   aspirin EC  81 mg Oral Daily   Or   aspirin  324 mg Per Tube Daily   bisacodyl  10 mg Oral Daily   Or   bisacodyl  10 mg Rectal Daily   chlorhexidine gluconate (MEDLINE KIT)  15 mL Mouth Rinse BID   Chlorhexidine Gluconate Cloth  6 each Topical Daily   docusate  sodium  200 mg Oral Daily   mouth rinse  15 mL Mouth Rinse 10 times per day   metoCLOPramide (REGLAN) injection  10 mg Intravenous Q6H   metoprolol tartrate  12.5 mg Oral BID   Or   metoprolol tartrate  12.5 mg Per Tube BID   [START ON 01/26/2019] pantoprazole  40 mg Oral Daily   sodium chloride flush  3 mL Intravenous Q12H   sodium chloride flush  3 mL Intravenous Q12H     Infusions:  sodium chloride     sodium chloride     sodium chloride     sodium chloride     sodium chloride     sodium chloride     albumin human 12.5 g (01/25/19 0550)   alteplase (tPA/ ACTIVASE) PE Lysis 12 mg/250 mL (BILATERAL)     alteplase (tPA/ ACTIVASE) PE Lysis 12 mg/250 mL (BILATERAL) 12 mg (02/10/2019 2050)   cefUROXime (ZINACEF)  IV Stopped (01/25/19 0149)   dexmedetomidine (PRECEDEX) IV infusion 0.8 mcg/kg/hr (01/25/19 0700)   epinephrine 36 mcg/min (01/25/19 0700)   famotidine (PEPCID) IV Stopped (01/25/19 0132)   fentaNYL infusion INTRAVENOUS 200 mcg/hr (01/25/19 0700)   heparin 1,200 Units/hr (  01/25/19 0700)   insulin 9 mL/hr at 01/25/19 0700   lactated ringers     lactated ringers Stopped (02/10/2019 2159)   magnesium sulfate 20 mL/hr at 01/25/19 0700   midazolam 5 mg/hr (01/25/19 0700)   nitroGLYCERIN     norepinephrine (LEVOPHED) Adult infusion 10 mcg/min (01/25/19 0700)    sodium bicarbonate (isotonic) infusion in sterile water Stopped (01/25/19 0505)   vancomycin Stopped (01/25/19 0305)   vasopressin (PITRESSIN) infusion - *FOR SHOCK* 0.04 Units/min (01/25/19 0700)     PRN Medications:  sodium chloride, sodium chloride, albumin human, bisacodyl, calcium chloride, dextrose, fentaNYL, metoprolol tartrate, midazolam, morphine injection, ondansetron (ZOFRAN) IV, oxyCODONE, sodium chloride flush, sodium chloride flush, traMADol   Assessment/Plan   1.  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  VA ECMO initiated  12/8.  Currently good flow (4-5 L/min range) at 3600 rpm.  Lactate decreasing.  UOP increasing.  He remains on norepinephrine 10, epinephrine 36, vasopressin 0.04. MAP 80s with CVP 8. Co-ox 65%.  - Gradually titrate down pressor support, start with norepinephrine, then vasopressin, then epinephrine.  - iNO 20 ppm for RV failure.  - Limited echo today to reassess his RV after local tPA.  - Trend lactate.  2. AKI: Creatinine improving with VA ECMO support, down to 1.9.    3. Acute hypoxic/hypoxemic respiratory failure: FiO2 0.4, improved ABG on ECMO.  4. ID: LUL infiltrate, PCT not elevated. WBCs 28 but suspect stress response. - Covering with cefepime for now.  5. Pulmonary embolus: Massive bilateral PE.  Currently getting catheter directed tPA to both PAs to finish around 8 pm.  Will follow for response by fall in pressor requirement and RV appearance on echo. ?Thrombectomy option if incomplete response. - Continue heparin gtt.  6. ENT: ENT bleeding, CCM to address.  7. Acute blood loss anemia: ENT bleeding, transfuse hgb < 8.   CRITICAL CARE Performed by: Loralie Champagne  Total critical care time: 45 minutes  Critical care time was exclusive of separately billable procedures and treating other patients.  Critical care was necessary to treat or prevent imminent or life-threatening deterioration.  Critical care was time spent personally by me on the following activities: development of treatment plan with patient and/or surrogate as well as nursing, discussions with consultants, evaluation of patient's response to treatment, examination of patient, obtaining history from patient or surrogate, ordering and performing treatments and interventions, ordering and review of laboratory studies, ordering and review of radiographic studies, pulse oximetry and re-evaluation of patient's condition.   Length of Stay: 2  Loralie Champagne, MD  01/25/2019, 7:24 AM  Advanced Heart Failure Team Pager  [phone] (M-F; 7a - 4p)  Please contact [facility] for night-coverage after hours (4p -7a ) and weekends on [email or web address]

## 2019-01-25 NOTE — Progress Notes (Addendum)
ANTICOAGULATION CONSULT NOTE   Pharmacy Consult for Heparin  Indication: pulmonary embolus + ECMO  No Known Allergies  Patient Measurements: Height: 5' 9.02" (175.3 cm) Weight: (!) 326 lb 11.6 oz (148.2 kg) IBW/kg (Calculated) : 70.74 Heparin Dosing Weight: 103 kg  Vital Signs: Temp: 99.5 F (37.5 C) (12/09 0700) Temp Source: Bladder (12/09 0000) BP: 91/74 (12/09 0342) Pulse Rate: 84 (12/09 0700)  Labs: Recent Labs    02/04/2019 1500  01/28/2019 1657  02/02/2019 0029  01/17/2019 1030 02/11/2019 1111  01/26/2019 1640  02/14/2019 1736  01/24/19 2059 01/25/19 0154 01/25/19 0200 01/25/19 0500 01/25/19 0504 01/25/19 0611  HGB  --    < >  --    < >  --    < >  --   --    < > 8.2*   < > 8.5*   < > 8.1* 7.5*  --   --  7.5*  --   HCT  --    < >  --    < >  --    < >  --   --    < > 24.0*   < > 25.5*   < > 23.7* 22.3*  --   --  22.0*  --   PLT  --    < >  --    < >  --    < >  --   --   --   --   --  152  --  122* 105*  --   --   --   --   APTT  --   --   --    < > 50*  --   --   --   --   --   --  138*  --  137*  --   --   --   --  42*  LABPROT  --    < >  --   --  17.6*  --   --  19.2*  --   --   --  22.9*  --   --   --   --   --   --  26.4*  INR  --    < >  --   --  1.5*  --   --  1.6*  --   --   --  2.0*  --   --   --   --   --   --  2.4*  HEPARINUNFRC  --   --   --   --   --   --  0.64  --   --   --   --   --   --  0.34  --   --  <0.10*  --   --   CREATININE  --    < >  --    < >  --    < >  --   --    < > 2.10*  --   --   --  2.32*  --  1.91*  --   --   --   TROPONINIHS 35*  --  315*  --  884*  --   --   --   --   --   --   --   --   --   --   --   --   --   --    < > = values in this  interval not displayed.    Estimated Creatinine Clearance: 54 mL/min (A) (by C-G formula based on SCr of 1.91 mg/dL (H)).  Assessment: 65 yoM admitted with massive PE and RV failure. Pt received 150mg  total of systemic tPA with continued shock so ECMO started 12/8. EKOS started along with IV  heparin.  Heparin level this morning undetectable, APTT 44s, INR 2.4. Significant overnight nasopharyngeal bleeding requiring administration of multiple pRBCs.    Goal of Therapy:  Heparin level 0.3-0.5 units/ml (while on ECMO) Monitor platelets by anticoagulation protocol: Yes    Plan:  -Increase heparin to 1500 units/hr -Recheck heparin level q6h until at goal x2, then q12h   ADDENDUM: repeat heparin level is therapeutic at 0.42, CBC stable, fibrinogen up.   Plan: -Continue heparin 1500 units/h -Check heparin level q6h until therapeutic x2, then q12h   Arrie Senate, PharmD, BCPS Clinical Pharmacist [phone] Please check AMION for all [place] numbers 01/25/2019

## 2019-01-25 NOTE — Progress Notes (Signed)
  Echocardiogram 2D Echocardiogram has been performed.  Philip Wolfe 01/25/2019, 1:30 PM

## 2019-01-25 NOTE — Op Note (Signed)
Philip Wolfe, Philip Wolfe MEDICAL RECORD [id number] ACCOUNT [id number] DATE OF BIRTH:[birth year] FACILITY: [facility] LOCATION: [facility] PHYSICIAN:Teo Moede VAN TRIGT III, MD  OPERATIVE REPORT  DATE OF PROCEDURE:  01-25-19  OPERATION: 1.  Cannulation for VA ECMO. 2.  Placement of 10 mm Gelweave graft to the right axillary artery for ECMO cannulation.  SURGEON:  Ivin Poot, MD  ASSISTANT:  Enid Cutter, PA-C  PREOPERATIVE DIAGNOSIS:  Acute bilateral pulmonary emboli with respiratory failure and cardiogenic shock from right ventricular dysfunction.  POSTOPERATIVE DIAGNOSIS:  Acute bilateral pulmonary emboli with respiratory failure and cardiogenic shock from right ventricular dysfunction.  ANESTHESIA:  General.  CLINICAL NOTE:  The patient is a morbidly obese 68 year old male with chronic history of lower leg varicose veins, venous insufficiency, and previous vein stripping.  He presented to the ED with acute respiratory failure requiring intubation.  A CTA  showed bilateral large proximal pulmonary emboli and he was admitted to the medical ICU where he received peripheral TPA and inotropic support for RV dysfunction.  He did not respond to that therapy.  His echo showed a significant dysfunction of his  right ventricle with LV function fairly well preserved.  He was recommended for VA ECMO to support his hemodynamics while having the bilateral pulmonary emboli treated with catheter infusion into both pulmonary emboli of TPA.  The interventional  radiology service felt that the patient was too high risk to receive that treatment unless he had hemodynamic support with ECMO.  I discussed the procedure of ECMO cannulation with the patient's wife in the ICU including the expected benefits, risks, alternatives.  She understood the risks to include bleeding, infection, organ failure, extremity ischemia, risk of amputation, and  death.  DESCRIPTION OF PROCEDURE:  The patient was brought to the  operating room on the ventilator from the ICU.  He was placed on the operating room table.  A transesophageal echo probe was placed by the anesthesia team.  The patient had a left radial artery  line and bilateral IJ lines.  The patient was prepped and draped as a sterile field.  A proper time-out was performed.  An incision was made beneath the right clavicle.  The pectoralis major was divided in a muscle splitting fashion.  The pectoralis minor was divided.  The deep retractor was placed.  The axillary artery was dissected from the brachial plexus and encircled  with vessel loops.  Heparin 5000 units was administered.  Vascular clamps were placed proximally and distally and an arteriotomy was made.  A 10 mm Gelweave graft was sewn end-to-side with first a running 5-0 Prolene, which was then reinforced with  interrupted 5-0 pledgeted Prolene sutures around the anastomosis.  The graft and the anastomosis were covered with a light layer of CoSeal and after the graft was clamped the vascular clamps were removed.  The graft was flushed and there was good  arterial outflow.  Next, an 8 mm 24-French arterial cannula was fed through the graft down to the level of the anastomosis and secured to the graft.  It also was flushed.  It was brought through a separate incision below the axillary artery incision.  Next, attention was directed to the right femoral vein.  A previously placed IV was used to pass a guidewire up to the right atrium.  The IV was removed.  Over the guidewire dilators, then a 25 mm multistage cannula was passed over the guidewire under  C-arm fluoroscopy into the right atrium to the appropriate level, secured to  the skin, and then flushed.  Next, the ECMO circuit was placed in the sterile field and the 2 cannulas were connected ECMO circuit.  The patient was then slowly placed on ECMO and his hemodynamics and oxygenation significantly improved.  Ventilator settings were reduced.  The  incision beneath the right clavicle was closed in layers using interrupted #1 Vicryl for the fascia and running 2-0 Vicryl for the skin and a subcuticular Vicryl for the skin.  Both cannulas were secured to the skin with several sutures of #1 silk  figure-of-eight sutures.  Sterile dressings were applied.  The patient was then prepared for transfer to vascular radiology to have bilateral pulmonary catheters placed for infusion of TPA over several hours to help reduce the clot burden and improve  oxygenation and RV function.  CN/NUANCE  D:01/30/2019 T:01/25/2019 JOB:[id number]

## 2019-01-25 NOTE — Progress Notes (Signed)
Interventional Radiology  S/p PE Lysis therapy.  Both infusion catheters removed.  One 6 french sheath removed and one sheath remains intact for access purposes requested by ordering physician.  Site dressed with gauze and tegaderm.  No bleeding around sheaths.  RN requested ordering MD to suture sheath.  No acute complications.  [place] RT-R

## 2019-01-25 NOTE — Progress Notes (Signed)
[place] changed w/o complications. ETT is in the correct location.

## 2019-01-25 NOTE — Progress Notes (Signed)
ANTICOAGULATION CONSULT NOTE   Pharmacy Consult for Heparin  Indication: pulmonary embolus + ECMO  No Known Allergies  Patient Measurements: Height: 5' 9.02" (175.3 cm) Weight: (!) 326 lb 11.6 oz (148.2 kg) IBW/kg (Calculated) : 70.74 Heparin Dosing Weight: 103 kg  Vital Signs: Temp: 99.1 F (37.3 C) (12/09 1945) Temp Source: Core (12/09 1834) BP: 115/74 (12/09 1950) Pulse Rate: 88 (12/09 1950)  Labs: Recent Labs    02/12/2019 1500  01/22/2019 1657  02/15/2019 0029  02/10/2019 1111  01/20/2019 1736  02/15/2019 2059  01/25/19 0200 01/25/19 0500  01/25/19 0611 01/25/19 0811  01/25/19 1315 01/25/19 1703 01/25/19 1832 01/25/19 1834  HGB  --    < >  --    < >  --    < >  --    < > 8.5*   < > 8.1*   < >  --   --    < >  --  9.1*   < > 9.0* 8.4* 7.8*  --   HCT  --    < >  --    < >  --    < >  --    < > 25.5*   < > 23.7*   < >  --   --    < >  --  25.9*   < > 25.6* 24.5* 23.0*  --   PLT  --    < >  --    < >  --    < >  --   --  152  --  122*   < >  --   --   --   --  70*  --  73* 74*  --   --   APTT  --   --   --    < > 50*  --   --   --  138*  --  137*  --   --   --   --  42*  --   --  93*  --   --  115*  LABPROT  --    < >  --   --  17.6*  --  19.2*  --  22.9*  --   --   --   --   --   --  26.4*  --   --   --   --   --   --   INR  --    < >  --   --  1.5*  --  1.6*  --  2.0*  --   --   --   --   --   --  2.4*  --   --   --   --   --   --   HEPARINUNFRC  --   --   --   --   --    < >  --   --   --   --  0.34  --   --  <0.10*  --   --   --   --  0.42  --   --  0.33  CREATININE  --    < >  --    < >  --    < >  --    < >  --   --  2.32*  --  1.91*  --   --   --   --   --   --  1.52*  --   --   TROPONINIHS 35*  --  315*  --  884*  --   --   --   --   --   --   --   --   --   --   --   --   --   --   --   --   --    < > = values in this interval not displayed.    Estimated Creatinine Clearance: 67.8 mL/min (A) (by C-G formula based on SCr of 1.52 mg/dL (H)).  Assessment: 28 yoM  admitted with massive PE and RV failure. Pt received 150mg  total of systemic tPA with continued shock so ECMO started 12/8. EKOS started along with IV heparin.  Heparin level this evening is at goal, APTT high 115s. Significant overnight nasopharyngeal bleeding requiring administration of multiple pRBCs. Bleeding stable this afternoon. No rate changes at this time.   Goal of Therapy:  Heparin level 0.3-0.5 units/ml (while on ECMO) Monitor platelets by anticoagulation protocol: Yes    Plan:  -Continue heparin at 1500 units/hr -Recheck heparin level with am labs   14/8 PharmD., BCPS Clinical Pharmacist 01/25/2019 8:06 PM

## 2019-01-26 ENCOUNTER — Inpatient Hospital Stay (HOSPITAL_COMMUNITY): Payer: BC Managed Care – PPO

## 2019-01-26 HISTORY — PX: IR US GUIDE VASC ACCESS RIGHT: IMG2390

## 2019-01-26 HISTORY — PX: IR ANGIOGRAM PULMONARY BILATERAL SELECTIVE: IMG664

## 2019-01-26 HISTORY — PX: IR INFUSION THROMBOL ARTERIAL INITIAL (MS): IMG5376

## 2019-01-26 HISTORY — PX: IR ANGIOGRAM SELECTIVE EACH ADDITIONAL VESSEL: IMG667

## 2019-01-26 LAB — POCT I-STAT 7, (LYTES, BLD GAS, ICA,H+H)
Acid-Base Excess: 1 mmol/L (ref 0.0–2.0)
Acid-Base Excess: 1 mmol/L (ref 0.0–2.0)
Acid-Base Excess: 1 mmol/L (ref 0.0–2.0)
Acid-Base Excess: 5 mmol/L — ABNORMAL HIGH (ref 0.0–2.0)
Acid-base deficit: 3 mmol/L — ABNORMAL HIGH (ref 0.0–2.0)
Bicarbonate: 28.4 mmol/L — ABNORMAL HIGH (ref 20.0–28.0)
Bicarbonate: 23.6 mmol/L (ref 20.0–28.0)
Bicarbonate: 21.1 mmol/L (ref 20.0–28.0)
Bicarbonate: 27.5 mmol/L (ref 20.0–28.0)
Bicarbonate: 25.2 mmol/L (ref 20.0–28.0)
Bicarbonate: 25.7 mmol/L (ref 20.0–28.0)
Bicarbonate: 29.9 mmol/L — ABNORMAL HIGH (ref 20.0–28.0)
Calcium, Ion: 1.08 mmol/L — ABNORMAL LOW (ref 1.15–1.40)
Calcium, Ion: 0.88 mmol/L — CL (ref 1.15–1.40)
Calcium, Ion: 0.88 mmol/L — CL (ref 1.15–1.40)
Calcium, Ion: 0.94 mmol/L — ABNORMAL LOW (ref 1.15–1.40)
Calcium, Ion: 0.87 mmol/L — CL (ref 1.15–1.40)
Calcium, Ion: 0.84 mmol/L — CL (ref 1.15–1.40)
Calcium, Ion: 0.94 mmol/L — ABNORMAL LOW (ref 1.15–1.40)
HCT: 26 % — ABNORMAL LOW (ref 39.0–52.0)
HCT: 17 % — ABNORMAL LOW (ref 39.0–52.0)
HCT: 17 % — ABNORMAL LOW (ref 39.0–52.0)
HCT: 21 % — ABNORMAL LOW (ref 39.0–52.0)
HCT: 21 % — ABNORMAL LOW (ref 39.0–52.0)
HCT: 20 % — ABNORMAL LOW (ref 39.0–52.0)
HCT: 20 % — ABNORMAL LOW (ref 39.0–52.0)
Hemoglobin: 8.8 g/dL — ABNORMAL LOW (ref 13.0–17.0)
Hemoglobin: 5.8 g/dL — CL (ref 13.0–17.0)
Hemoglobin: 5.8 g/dL — CL (ref 13.0–17.0)
Hemoglobin: 7.1 g/dL — ABNORMAL LOW (ref 13.0–17.0)
Hemoglobin: 7.1 g/dL — ABNORMAL LOW (ref 13.0–17.0)
Hemoglobin: 6.8 g/dL — CL (ref 13.0–17.0)
Hemoglobin: 6.8 g/dL — CL (ref 13.0–17.0)
O2 Saturation: 100 %
O2 Saturation: 100 %
O2 Saturation: 100 %
O2 Saturation: 100 %
O2 Saturation: 100 %
O2 Saturation: 100 %
O2 Saturation: 100 %
Patient temperature: 36.9
Patient temperature: 36.8
Patient temperature: 36.8
Patient temperature: 36.7
Patient temperature: 36.5
Patient temperature: 36.4
Patient temperature: 98.6
Potassium: 3 mmol/L — ABNORMAL LOW (ref 3.5–5.1)
Potassium: 2.9 mmol/L — ABNORMAL LOW (ref 3.5–5.1)
Potassium: 2.9 mmol/L — ABNORMAL LOW (ref 3.5–5.1)
Potassium: 3.7 mmol/L (ref 3.5–5.1)
Potassium: 3.3 mmol/L — ABNORMAL LOW (ref 3.5–5.1)
Potassium: 3.2 mmol/L — ABNORMAL LOW (ref 3.5–5.1)
Potassium: 3.8 mmol/L (ref 3.5–5.1)
Sodium: 144 mmol/L (ref 135–145)
Sodium: 142 mmol/L (ref 135–145)
Sodium: 143 mmol/L (ref 135–145)
Sodium: 142 mmol/L (ref 135–145)
Sodium: 143 mmol/L (ref 135–145)
Sodium: 143 mmol/L (ref 135–145)
Sodium: 140 mmol/L (ref 135–145)
TCO2: 30 mmol/L (ref 22–32)
TCO2: 25 mmol/L (ref 22–32)
TCO2: 22 mmol/L (ref 22–32)
TCO2: 29 mmol/L (ref 22–32)
TCO2: 27 mmol/L (ref 22–32)
TCO2: 27 mmol/L (ref 22–32)
TCO2: 31 mmol/L (ref 22–32)
pCO2 arterial: 61.6 mmHg — ABNORMAL HIGH (ref 32.0–48.0)
pCO2 arterial: 28.8 mmHg — ABNORMAL LOW (ref 32.0–48.0)
pCO2 arterial: 29.6 mmHg — ABNORMAL LOW (ref 32.0–48.0)
pCO2 arterial: 59.7 mmHg — ABNORMAL HIGH (ref 32.0–48.0)
pCO2 arterial: 43.6 mmHg (ref 32.0–48.0)
pCO2 arterial: 37.3 mmHg (ref 32.0–48.0)
pCO2 arterial: 42.9 mmHg (ref 32.0–48.0)
pH, Arterial: 7.271 — ABNORMAL LOW (ref 7.350–7.450)
pH, Arterial: 7.521 — ABNORMAL HIGH (ref 7.350–7.450)
pH, Arterial: 7.461 — ABNORMAL HIGH (ref 7.350–7.450)
pH, Arterial: 7.27 — ABNORMAL LOW (ref 7.350–7.450)
pH, Arterial: 7.367 (ref 7.350–7.450)
pH, Arterial: 7.444 (ref 7.350–7.450)
pH, Arterial: 7.451 — ABNORMAL HIGH (ref 7.350–7.450)
pO2, Arterial: 421 mmHg — ABNORMAL HIGH (ref 83.0–108.0)
pO2, Arterial: 270 mmHg — ABNORMAL HIGH (ref 83.0–108.0)
pO2, Arterial: 211 mmHg — ABNORMAL HIGH (ref 83.0–108.0)
pO2, Arterial: 291 mmHg — ABNORMAL HIGH (ref 83.0–108.0)
pO2, Arterial: 355 mmHg — ABNORMAL HIGH (ref 83.0–108.0)
pO2, Arterial: 369 mmHg — ABNORMAL HIGH (ref 83.0–108.0)
pO2, Arterial: 266 mmHg — ABNORMAL HIGH (ref 83.0–108.0)

## 2019-01-26 LAB — CBC
HCT: 22.3 % — ABNORMAL LOW (ref 39.0–52.0)
HCT: 22.5 % — ABNORMAL LOW (ref 39.0–52.0)
HCT: 25.2 % — ABNORMAL LOW (ref 39.0–52.0)
HCT: 22.4 % — ABNORMAL LOW (ref 39.0–52.0)
Hemoglobin: 8 g/dL — ABNORMAL LOW (ref 13.0–17.0)
Hemoglobin: 7.7 g/dL — ABNORMAL LOW (ref 13.0–17.0)
Hemoglobin: 8.6 g/dL — ABNORMAL LOW (ref 13.0–17.0)
Hemoglobin: 7.7 g/dL — ABNORMAL LOW (ref 13.0–17.0)
MCH: 31.1 pg (ref 26.0–34.0)
MCH: 30.2 pg (ref 26.0–34.0)
MCH: 30.9 pg (ref 26.0–34.0)
MCH: 31 pg (ref 26.0–34.0)
MCHC: 35.9 g/dL (ref 30.0–36.0)
MCHC: 34.2 g/dL (ref 30.0–36.0)
MCHC: 34.1 g/dL (ref 30.0–36.0)
MCHC: 34.4 g/dL (ref 30.0–36.0)
MCV: 86.8 fL (ref 80.0–100.0)
MCV: 88.2 fL (ref 80.0–100.0)
MCV: 90.6 fL (ref 80.0–100.0)
MCV: 90.3 fL (ref 80.0–100.0)
Platelets: 70 10*3/uL — ABNORMAL LOW (ref 150–400)
Platelets: 65 10*3/uL — ABNORMAL LOW (ref 150–400)
Platelets: 63 10*3/uL — ABNORMAL LOW (ref 150–400)
Platelets: 57 10*3/uL — ABNORMAL LOW (ref 150–400)
RBC: 2.57 MIL/uL — ABNORMAL LOW (ref 4.22–5.81)
RBC: 2.55 MIL/uL — ABNORMAL LOW (ref 4.22–5.81)
RBC: 2.78 MIL/uL — ABNORMAL LOW (ref 4.22–5.81)
RBC: 2.48 MIL/uL — ABNORMAL LOW (ref 4.22–5.81)
RDW: 14 % (ref 11.5–15.5)
RDW: 13.7 % (ref 11.5–15.5)
RDW: 14.3 % (ref 11.5–15.5)
RDW: 14.4 % (ref 11.5–15.5)
WBC: 36 10*3/uL — ABNORMAL HIGH (ref 4.0–10.5)
WBC: 34.5 10*3/uL — ABNORMAL HIGH (ref 4.0–10.5)
WBC: 35.6 10*3/uL — ABNORMAL HIGH (ref 4.0–10.5)
WBC: 36.1 10*3/uL — ABNORMAL HIGH (ref 4.0–10.5)
nRBC: 0.8 % — ABNORMAL HIGH (ref 0.0–0.2)
nRBC: 1.1 % — ABNORMAL HIGH (ref 0.0–0.2)
nRBC: 1.7 % — ABNORMAL HIGH (ref 0.0–0.2)
nRBC: 2.1 % — ABNORMAL HIGH (ref 0.0–0.2)

## 2019-01-26 LAB — GLUCOSE, CAPILLARY
Glucose-Capillary: 151 mg/dL — ABNORMAL HIGH (ref 70–99)
Glucose-Capillary: 157 mg/dL — ABNORMAL HIGH (ref 70–99)
Glucose-Capillary: 164 mg/dL — ABNORMAL HIGH (ref 70–99)
Glucose-Capillary: 165 mg/dL — ABNORMAL HIGH (ref 70–99)
Glucose-Capillary: 166 mg/dL — ABNORMAL HIGH (ref 70–99)
Glucose-Capillary: 168 mg/dL — ABNORMAL HIGH (ref 70–99)
Glucose-Capillary: 174 mg/dL — ABNORMAL HIGH (ref 70–99)
Glucose-Capillary: 174 mg/dL — ABNORMAL HIGH (ref 70–99)
Glucose-Capillary: 180 mg/dL — ABNORMAL HIGH (ref 70–99)
Glucose-Capillary: 181 mg/dL — ABNORMAL HIGH (ref 70–99)
Glucose-Capillary: 188 mg/dL — ABNORMAL HIGH (ref 70–99)
Glucose-Capillary: 188 mg/dL — ABNORMAL HIGH (ref 70–99)
Glucose-Capillary: 189 mg/dL — ABNORMAL HIGH (ref 70–99)
Glucose-Capillary: 199 mg/dL — ABNORMAL HIGH (ref 70–99)
Glucose-Capillary: 203 mg/dL — ABNORMAL HIGH (ref 70–99)
Glucose-Capillary: 204 mg/dL — ABNORMAL HIGH (ref 70–99)
Glucose-Capillary: 218 mg/dL — ABNORMAL HIGH (ref 70–99)

## 2019-01-26 LAB — COMPREHENSIVE METABOLIC PANEL
ALT: 32 U/L (ref 0–44)
ALT: 32 U/L (ref 0–44)
AST: 45 U/L — ABNORMAL HIGH (ref 15–41)
AST: 59 U/L — ABNORMAL HIGH (ref 15–41)
Albumin: 2 g/dL — ABNORMAL LOW (ref 3.5–5.0)
Albumin: 1.9 g/dL — ABNORMAL LOW (ref 3.5–5.0)
Alkaline Phosphatase: 35 U/L — ABNORMAL LOW (ref 38–126)
Alkaline Phosphatase: 42 U/L (ref 38–126)
Anion gap: 8 (ref 5–15)
Anion gap: 10 (ref 5–15)
BUN: 43 mg/dL — ABNORMAL HIGH (ref 8–23)
BUN: 37 mg/dL — ABNORMAL HIGH (ref 8–23)
CO2: 25 mmol/L (ref 22–32)
CO2: 27 mmol/L (ref 22–32)
Calcium: 6.4 mg/dL — CL (ref 8.9–10.3)
Calcium: 6.4 mg/dL — CL (ref 8.9–10.3)
Chloride: 107 mmol/L (ref 98–111)
Chloride: 105 mmol/L (ref 98–111)
Creatinine, Ser: 1.37 mg/dL — ABNORMAL HIGH (ref 0.61–1.24)
Creatinine, Ser: 1.29 mg/dL — ABNORMAL HIGH (ref 0.61–1.24)
GFR calc Af Amer: >60 mL/min (ref >60)
GFR calc Af Amer: >60 mL/min (ref >60)
GFR calc non Af Amer: 53 mL/min — ABNORMAL LOW (ref >60)
GFR calc non Af Amer: 57 mL/min — ABNORMAL LOW (ref >60)
Glucose, Bld: 211 mg/dL — ABNORMAL HIGH (ref 70–99)
Glucose, Bld: 191 mg/dL — ABNORMAL HIGH (ref 70–99)
Potassium: 3.3 mmol/L — ABNORMAL LOW (ref 3.5–5.1)
Potassium: 3.8 mmol/L (ref 3.5–5.1)
Sodium: 140 mmol/L (ref 135–145)
Sodium: 142 mmol/L (ref 135–145)
Total Bilirubin: 1.1 mg/dL (ref 0.3–1.2)
Total Bilirubin: 0.7 mg/dL (ref 0.3–1.2)
Total Protein: 3.6 g/dL — ABNORMAL LOW (ref 6.5–8.1)
Total Protein: 3.7 g/dL — ABNORMAL LOW (ref 6.5–8.1)

## 2019-01-26 LAB — PREPARE RBC (CROSSMATCH)

## 2019-01-26 LAB — MAGNESIUM: Magnesium: 2.1 mg/dL (ref 1.7–2.4)

## 2019-01-26 LAB — HEPARIN LEVEL (UNFRACTIONATED)
Heparin Unfractionated: 0.37 IU/mL (ref 0.30–0.70)
Heparin Unfractionated: 0.52 IU/mL (ref 0.30–0.70)

## 2019-01-26 LAB — COOXEMETRY PANEL
Carboxyhemoglobin: 2 % — ABNORMAL HIGH (ref 0.5–1.5)
Methemoglobin: 1.7 % — ABNORMAL HIGH (ref 0.0–1.5)
O2 Saturation: 78.6 %
Total hemoglobin: 8.7 g/dL — ABNORMAL LOW (ref 12.0–16.0)

## 2019-01-26 LAB — ECHOCARDIOGRAM LIMITED
Height: 69.016 in
Weight: 5368.64 oz

## 2019-01-26 LAB — LACTIC ACID, PLASMA
Lactic Acid, Venous: 2.3 mmol/L (ref 0.5–1.9)
Lactic Acid, Venous: 1.6 mmol/L (ref 0.5–1.9)

## 2019-01-26 LAB — PROTIME-INR
INR: 1.5 — ABNORMAL HIGH (ref 0.8–1.2)
Prothrombin Time: 18 seconds — ABNORMAL HIGH (ref 11.4–15.2)

## 2019-01-26 LAB — APTT
aPTT: 104 seconds — ABNORMAL HIGH (ref 24–36)
aPTT: 111 seconds — ABNORMAL HIGH (ref 24–36)

## 2019-01-26 LAB — LACTATE DEHYDROGENASE
LDH: 309 U/L — ABNORMAL HIGH (ref 98–192)
LDH: 332 U/L — ABNORMAL HIGH (ref 98–192)

## 2019-01-26 LAB — FIBRINOGEN
Fibrinogen: 418 mg/dL (ref 210–475)
Fibrinogen: 401 mg/dL (ref 210–475)

## 2019-01-26 MED ORDER — CALCIUM GLUCONATE-NACL 1-0.675 GM/50ML-% IV SOLN
1.0000 g | Freq: Once | INTRAVENOUS | Status: AC
  Administered 2019-01-26: 1000 mg via INTRAVENOUS
  Filled 2019-01-26: qty 50

## 2019-01-26 MED ORDER — FUROSEMIDE 10 MG/ML IJ SOLN
40.0000 mg | Freq: Once | INTRAMUSCULAR | Status: AC
  Administered 2019-01-26: 40 mg via INTRAVENOUS
  Filled 2019-01-26: qty 4

## 2019-01-26 MED ORDER — SODIUM CHLORIDE 0.9 % IV SOLN
INTRAVENOUS | Status: DC
  Administered 2019-01-27: 05:00:00 via INTRAVENOUS

## 2019-01-26 MED ORDER — SODIUM CHLORIDE 0.9 % IV SOLN: 250.0000 mL | INTRAVENOUS | Status: DC | PRN

## 2019-01-26 MED ORDER — IOHEXOL 350 MG/ML SOLN
100.0000 mL | Freq: Once | INTRAVENOUS | Status: AC | PRN
  Administered 2019-01-26: 80 mL via INTRAVENOUS

## 2019-01-26 MED ORDER — POTASSIUM CHLORIDE 10 MEQ/50ML IV SOLN
10.0000 meq | INTRAVENOUS | Status: AC
  Administered 2019-01-26 (×4): 10 meq via INTRAVENOUS
  Filled 2019-01-26 (×6): qty 50

## 2019-01-26 MED ORDER — POTASSIUM CHLORIDE 10 MEQ/50ML IV SOLN
10.0000 meq | INTRAVENOUS | Status: AC
  Administered 2019-01-26 (×2): 10 meq via INTRAVENOUS

## 2019-01-26 MED ORDER — FENTANYL BOLUS VIA INFUSION
50.0000 ug | INTRAVENOUS | Status: DC | PRN
  Administered 2019-01-29 – 2019-02-02 (×4): 50 ug via INTRAVENOUS
  Filled 2019-01-26: qty 50

## 2019-01-26 MED ORDER — MIDAZOLAM BOLUS VIA INFUSION
1.0000 mg | INTRAVENOUS | Status: DC | PRN
  Administered 2019-01-29: 1 mg via INTRAVENOUS
  Administered 2019-02-01 – 2019-02-02 (×2): 2 mg via INTRAVENOUS
  Filled 2019-01-26: qty 2

## 2019-01-26 MED ORDER — IOHEXOL 300 MG/ML  SOLN
50.0000 mL | Freq: Once | INTRAMUSCULAR | Status: AC | PRN
  Administered 2019-01-26: 25 mL via INTRA_ARTERIAL

## 2019-01-26 MED ORDER — IOHEXOL 300 MG/ML  SOLN
50.0000 mL | Freq: Once | INTRAMUSCULAR | Status: AC | PRN
  Administered 2019-01-26: 10 mL via INTRA_ARTERIAL

## 2019-01-26 MED ORDER — ARTIFICIAL TEARS OPHTHALMIC OINT
1.0000 "application " | TOPICAL_OINTMENT | Freq: Three times a day (TID) | OPHTHALMIC | Status: DC
  Administered 2019-01-26 – 2019-01-27 (×6): 1 via OPHTHALMIC
  Filled 2019-01-26: qty 3.5

## 2019-01-26 MED ORDER — SODIUM CHLORIDE 0.9% FLUSH
3.0000 mL | Freq: Two times a day (BID) | INTRAVENOUS | Status: DC
  Administered 2019-01-27: 5 mL via INTRAVENOUS

## 2019-01-26 MED ORDER — SODIUM CHLORIDE 0.9% FLUSH: 3.0000 mL | INTRAVENOUS | Status: DC | PRN

## 2019-01-26 MED ORDER — SODIUM CHLORIDE 0.9 % IV SOLN
2.0000 mg/h | INTRAVENOUS | Status: DC
  Administered 2019-01-26 (×2): 10 mg/h via INTRAVENOUS
  Filled 2019-01-26 (×4): qty 10

## 2019-01-26 MED ORDER — SODIUM CHLORIDE 0.9% IV SOLUTION
Freq: Once | INTRAVENOUS | Status: AC
  Administered 2019-01-26: 20:00:00 via INTRAVENOUS

## 2019-01-26 MED ORDER — FUROSEMIDE 10 MG/ML IJ SOLN
8.0000 mg/h | INTRAVENOUS | Status: DC
  Administered 2019-01-26 (×2): 8 mg/h via INTRAVENOUS
  Administered 2019-01-27: 4 mg/h via INTRAVENOUS
  Administered 2019-01-28 – 2019-02-01 (×4): 8 mg/h via INTRAVENOUS
  Filled 2019-01-26 (×7): qty 25
  Filled 2019-01-26: qty 21

## 2019-01-26 MED ORDER — FENTANYL CITRATE (PF) 100 MCG/2ML IJ SOLN
50.0000 ug | Freq: Once | INTRAMUSCULAR | Status: AC
  Administered 2019-01-26: 50 ug via INTRAVENOUS

## 2019-01-26 MED ORDER — SODIUM CHLORIDE 0.9 % IV SOLN
0.0000 ug/kg/min | INTRAVENOUS | Status: DC
  Administered 2019-01-26 (×2): 3.5 ug/kg/min via INTRAVENOUS
  Administered 2019-01-26: 3 ug/kg/min via INTRAVENOUS
  Administered 2019-01-27 (×2): 4.5 ug/kg/min via INTRAVENOUS
  Filled 2019-01-26 (×7): qty 20

## 2019-01-26 MED ORDER — FENTANYL 2500MCG IN NS 250ML (10MCG/ML) PREMIX INFUSION
50.0000 ug/h | INTRAVENOUS | Status: DC
  Administered 2019-01-26 (×2): 225 ug/h via INTRAVENOUS
  Administered 2019-01-27: 325 ug/h via INTRAVENOUS
  Administered 2019-01-27: 300 ug/h via INTRAVENOUS
  Administered 2019-01-27: 275 ug/h via INTRAVENOUS
  Administered 2019-01-28 – 2019-01-29 (×3): 375 ug/h via INTRAVENOUS
  Administered 2019-01-29: 325 ug/h via INTRAVENOUS
  Administered 2019-01-29: 300 ug/h via INTRAVENOUS
  Administered 2019-01-30: 250 ug/h via INTRAVENOUS
  Administered 2019-01-30: 300 ug/h via INTRAVENOUS
  Administered 2019-01-31: 200 ug/h via INTRAVENOUS
  Administered 2019-01-31: 100 ug/h via INTRAVENOUS
  Administered 2019-02-01: 400 ug/h via INTRAVENOUS
  Administered 2019-02-01: 150 ug/h via INTRAVENOUS
  Administered 2019-02-02: 400 ug/h via INTRAVENOUS
  Administered 2019-02-02: 150 ug/h via INTRAVENOUS
  Administered 2019-02-03: 175 ug/h via INTRAVENOUS
  Filled 2019-01-26 (×20): qty 250

## 2019-01-26 MED ORDER — SODIUM CHLORIDE 0.9 % IV SOLN
12.0000 mg | INTRAVENOUS | Status: AC
  Administered 2019-01-26: 18:00:00 12 mg via INTRAVENOUS
  Filled 2019-01-26: qty 12

## 2019-01-26 MED ORDER — SODIUM CHLORIDE 0.9 % IV SOLN
2.0000 g | Freq: Three times a day (TID) | INTRAVENOUS | Status: DC
  Administered 2019-01-26 – 2019-01-31 (×15): 2 g via INTRAVENOUS
  Filled 2019-01-26 (×16): qty 2

## 2019-01-26 MED ORDER — SODIUM CHLORIDE 0.9% IV SOLUTION: Freq: Once | INTRAVENOUS | Status: DC

## 2019-01-26 MED ORDER — SODIUM CHLORIDE 0.9 % IV SOLN: 12.0000 mg | Freq: Once | INTRAVENOUS | Status: DC

## 2019-01-26 MED ORDER — POTASSIUM CHLORIDE 10 MEQ/50ML IV SOLN
10.0000 meq | INTRAVENOUS | Status: AC
  Administered 2019-01-26 (×3): 10 meq via INTRAVENOUS
  Filled 2019-01-26 (×3): qty 50

## 2019-01-26 MED ORDER — MIDAZOLAM 100 MG/100ML IV SOLN: 2.0000 mg/h | INTRAVENOUS | Status: DC

## 2019-01-26 MED ORDER — MIDAZOLAM 50MG/50ML (1MG/ML) PREMIX INFUSION
2.0000 mg/h | INTRAVENOUS | Status: DC
  Administered 2019-01-26: 10 mg/h via INTRAVENOUS
  Filled 2019-01-26 (×2): qty 50

## 2019-01-26 MED ORDER — POTASSIUM CHLORIDE 10 MEQ/50ML IV SOLN
10.0000 meq | INTRAVENOUS | Status: AC
  Administered 2019-01-26 (×2): 10 meq via INTRAVENOUS
  Filled 2019-01-26 (×2): qty 50

## 2019-01-26 MED ORDER — SODIUM CHLORIDE 0.9 % IV SOLN
12.0000 mg | INTRAVENOUS | Status: AC
  Administered 2019-01-26: 12 mg via INTRAVENOUS
  Filled 2019-01-26: qty 12

## 2019-01-26 MED ORDER — POTASSIUM CHLORIDE 10 MEQ/50ML IV SOLN
10.0000 meq | INTRAVENOUS | Status: AC
  Administered 2019-01-26 – 2019-01-27 (×4): 10 meq via INTRAVENOUS
  Filled 2019-01-26 (×4): qty 50

## 2019-01-26 NOTE — Progress Notes (Signed)
RT NOTE: RT transported patient from [place] to IR and back with second RT, RN, ECMO specialist and transport. No complications. RT will continue to monitor.

## 2019-01-26 NOTE — Progress Notes (Signed)
CVP 5 @0300 . Per perfusionist, Christy Sartorius, give x1 Albumin. No complications w/ ECMO circuit. Will continue to monitor.

## 2019-01-26 NOTE — Progress Notes (Signed)
Referring Physician(s): Dr. Isaiah Serge  Supervising Physician: Richarda Overlie  Patient Status:  [name] - In-pt  Chief Complaint: PE lysis  Subjective: Patient remain critically ill on ECMO due to cor pulmonale related to massive pulmonary embolus. S/p PE lysis 12/8 with catheter/sheath removal yesterday 12/9.  A RIJ catheter was left in place for central access.    Still with significant clot burden, particularly in the right main pulmonary artery.  CTA today showed: 1. Evidence of patient's known moderate bilateral pulmonary emboli right worse than left with mild overall improvement as described. Evidence of persistent right heart strain.  2. New small bilateral pleural effusions with associated moderate compressive atelectasis in the lung bases. Opacification of the proximal right bronchi with obstruction of the right lower lobe bronchus.  3. Slight interval improvement of hazy airspace opacification over the left perihilar region which may be due to asymmetric edema or infection. New mild peripheral opacification over the anterior right upper lobe which may be due to atelectasis or infection.  Medical team has discussed with patient's wife and Dr. Lowella Dandy  For repeat lysis today per TCTS/CCM.   Allergies: Patient has no known allergies.  Medications: Prior to Admission medications   Medication Sig Start Date End Date Taking? Authorizing Provider  amLODipine (NORVASC) 5 MG tablet Take 5 mg by mouth daily. 11/24/18  Yes [provider]  fish oil-omega-3 fatty acids 1000 MG capsule Take 1 g by mouth daily.     Yes [provider]  losartan-hydrochlorothiazide (HYZAAR) 100-25 MG tablet Take 1 tablet by mouth daily. 10/31/18  Yes [provider]  meloxicam (MOBIC) 7.5 MG tablet Take 7.5 mg by mouth daily. 01/16/19  Yes [provider]  nebivolol (BYSTOLIC) 10 MG tablet Take 10 mg by mouth daily.     Yes [provider]  spironolactone  (ALDACTONE) 100 MG tablet Take 100 mg by mouth daily. 12/11/18  Yes [provider]     Vital Signs: BP 94/64   Pulse 81   Temp 97.9 F (36.6 C)   Resp 16   Ht 5' 9.02" (1.753 m)   Wt (!) 335 lb 8.6 oz (152.2 kg)   SpO2 100%   BMI 49.53 kg/m   Physical Exam  Intubated, sedated, on ECMO ENT: Blood in OGT suspected from nasopharynx, 250 mL in wall canister RIJ cathter in place with small amount of blood present under dressing.  Skin: discoloration of the hands and feet, anasarca   Imaging: CT HEAD WO CONTRAST  Result Date: 02/08/2019 CLINICAL DATA:  Encephalopathy EXAM: CT HEAD WITHOUT CONTRAST TECHNIQUE: Contiguous axial images were obtained from the base of the skull through the vertex without intravenous contrast. COMPARISON:  None. FINDINGS: Brain: There is no mass, hemorrhage or extra-axial collection. There is generalized atrophy without lobar predilection. Hypodensity of the white matter is most commonly associated with chronic microvascular disease. Vascular: No abnormal hyperdensity of the major intracranial arteries or dural venous sinuses. No intracranial atherosclerosis. Skull: Fluid levels in the frontal and maxillary sinuses. Moderate ethmoid sinus opacification. Sinuses/Orbits: No fluid levels or advanced mucosal thickening of the visualized paranasal sinuses. No mastoid or middle ear effusion. The orbits are normal. 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 Electronically Signed   By: Deatra Robinson M.D.   On: 01/31/2019 23:32   CT ANGIO CHEST PE W OR WO CONTRAST  Result Date: 01/26/2019 CLINICAL DATA:  Known recently diagnosed pulmonary embolism on anticoagulant treatment. Follow-up. EXAM: CT  ANGIOGRAPHY CHEST WITH CONTRAST TECHNIQUE: Multidetector CT imaging of the chest was performed using the standard protocol during bolus administration of intravenous contrast. Multiplanar CT image  reconstructions and MIPs were obtained to evaluate the vascular anatomy. CONTRAST:  49mL OMNIPAQUE IOHEXOL 350 MG/ML SOLN COMPARISON:  02-19-19 FINDINGS: Cardiovascular: Bilateral IJ venous lines present. Mild cardiomegaly. Mild calcified plaque over the 3 vessel coronary arteries. Ectasia of the ascending thoracic aorta measuring 3.5 cm unchanged. Pulmonary arterial system demonstrates evidence of patient's bilateral pulmonary emboli with slight interval over the left lower lobar arteries. Also improvement over the right main pulmonary artery. Persistent moderate burden of emboli over the proximal right upper, middle and lower lobar pulmonary arteries with minimal interval improvement. Evidence of persistent right heart strain (RV/LV is 34.31/27.10 =1.26). Mediastinum/Nodes: No significant mediastinal or hilar adenopathy. Lungs/Pleura: Endotracheal tube has tip approximately 1.4 cm above the carina. Interval development of small bilateral pleural effusions with associated moderate bibasilar compressive atelectasis. Persistent hazy opacification over the left perihilar region with slight interval improvement. New mild patchy peripheral opacification over the anterior right upper lobe. Complete opacification over the proximal lobar bronchi with complete obstruction of the right lower lobe bronchus. Upper Abdomen: Catheter over the IVC extending through the right heart with tip over the SVC just above the superior cavoatrial junction. 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 Mild cholelithiasis. Musculoskeletal: Degenerative change of the spine. Review of the MIP images confirms the above findings. IMPRESSION: 1. Evidence of patient's known moderate bilateral pulmonary emboli right worse than left with mild overall improvement as described. Evidence of persistent right heart strain. 2. New small bilateral pleural effusions with associated moderate  compressive atelectasis in the lung bases. Opacification of the proximal right bronchi with obstruction of the right lower lobe bronchus. 3. Slight interval improvement of hazy airspace opacification over the left perihilar region which may be due to asymmetric edema or infection. New mild peripheral opacification over the anterior right upper lobe which may be due to atelectasis or infection. 4.  Tubes and lines as described. 5. Stable ectasia of the ascending thoracic aorta measuring 3.5 cm in AP diameter. Recommend annual imaging followup by CTA or MRA. This recommendation follows 2010 ACCF/AHA/AATS/ACR/ASA/SCA/SCAI/SIR/STS/SVM Guidelines for the Diagnosis and Management of Patients with Thoracic Aortic Disease. Circulation.2010; 121: Z366-Y403. Aortic aneurysm NOS (ICD10-I71.9). 6.  Atherosclerotic coronary artery disease. 7.  Cholelithiasis. Electronically Signed   By: Marin Olp M.D.   On: 01/26/2019 10:40   CT Angio Chest PE W/Cm &/Or Wo Cm  Result Date: 02-19-2019 CLINICAL DATA:  Shortness of breath and recent CPR EXAM: CT ANGIOGRAPHY CHEST WITH CONTRAST TECHNIQUE: Multidetector CT imaging of the chest was performed using the standard protocol during bolus administration of intravenous contrast. Multiplanar CT image reconstructions and MIPs were obtained to evaluate the vascular anatomy. CONTRAST:  17mL OMNIPAQUE IOHEXOL 350 MG/ML SOLN COMPARISON:  Chest x-ray from earlier in the same day. FINDINGS: Cardiovascular: Thoracic aorta demonstrates mild atherosclerotic calcifications. No aneurysmal dilatation or dissection is seen. Pulmonary artery is well visualized within normal branching pattern. Large bilateral pulmonary emboli are identified with near complete loss of pulmonary arterial flow on the right and significant decrease on the left. Changes consistent with right heart strain are noted. Mediastinum/Nodes: Thoracic inlet is within normal limits. No hilar or mediastinal adenopathy is noted. The  esophagus as visualized is within normal limits. Endotracheal tube is noted in satisfactory position. Lungs/Pleura: Lungs are well aerated bilaterally. Patchy  ground-glass infiltrate is noted in the left upper lobe in a perihilar distribution. No other focal infiltrate is seen. No sizable effusion or pneumothorax is noted. Upper Abdomen: Visualized upper abdomen shows gastric lap band in satisfactory position. No other focal abnormality is noted. Musculoskeletal: Degenerative changes of the thoracic spine are seen. Some undisplaced rib fractures are noted anteriorly involving the fourth, fifth and sixth ribs on the right. Review of the MIP images confirms the above findings. IMPRESSION: Massive bilateral central pulmonary emboli with significant decrease in pulmonary arterial flow. Right heart strain is noted. Patchy ground-glass infiltrate in the left upper lobe. Patient has negative COVID-19 status and this may represent acute pneumonic infiltrate or sequelae from the known pulmonary emboli. Undisplaced rib fractures on the right as described without pneumothorax. No other focal abnormality is noted. Aortic Atherosclerosis (ICD10-I70.0). Critical Value/emergent results were called by telephone at the time of interpretation on 02/04/2019 at 11:33 pm to Dr. Madilyn Hook, who verbally acknowledged these results. Electronically Signed   By: Alcide Clever M.D.   On: 01/20/2019 23:41   IR Angiogram Pulmonary Bilateral Selective  Result Date: 01/29/19 INDICATION: Acute bilateral occlusive pulmonary emboli EXAM: Ultrasound guidance for vascular access Bilateral pulmonary artery catheterizations, angiograms, and insertion of infusion catheters for PE thrombolysis COMPARISON:  11/23/2018 MEDICATIONS: Patient is currently on ECMO ANESTHESIA/SEDATION: The patient was continuously monitored during the procedure by the ECMO team FLUOROSCOPY TIME:  Fluoroscopy Time: 14 minutes 12 seconds (822 mGy). COMPLICATIONS: None immediate.  TECHNIQUE: Informed written consent was obtained from the patient's family after a thorough discussion of the procedural risks, benefits and alternatives. All questions were addressed. Maximal Sterile Barrier Technique was utilized including caps, mask, sterile gowns, sterile gloves, sterile drape, hand hygiene and skin antiseptic. A timeout was performed prior to the initiation of the procedure. Under sterile conditions, the existing 8 French sheath in the right internal jugular vein was exchanged over 2 guidewires. Two adjacent 6 French sheaths were inserted. Initially, a 6 French angle pigtail catheter was advanced through 1 of the 6 French sheaths into the right heart. This was advanced through the right heart outflow into the pulmonary arteries and initially position of the right pulmonary artery. Pulmonary angiogram performed. Initial pulmonary angiogram: Occlusive filling defects noted in the proximal right pulmonary artery. Nonocclusive filling defects in the left pulmonary artery. Over Rosen guidewire, a 10 cm infusion catheter was advanced into the right pulmonary artery extending into the lower lobe branches. Contrast injection confirms position. Inner occlusion wire advanced. Position confirmed with fluoroscopy. In a similar fashion through the second right IJ adjacent 6 French sheath, the angled 6 French pigtail catheter was advanced and positioned in the right heart initially. This was advanced through the right heart outflow into the main pulmonary artery. Catheter was exchanged for a Kumpe catheter. Kumpe catheter was directed to the left pulmonary artery. Over the [facility] guidewire, a second 10 cm infusion catheter was advanced into the left pulmonary artery. Position confirmed with contrast injection. Inner occlusion wire advanced. Catheter positions confirmed with fluoroscopy. Access site secured to the right neck with a sterile dressing. PE thrombo lysis protocol will be initiated through both  catheters for the standard protocol infusion. FINDINGS: Bilateral pulmonary angiograms confirm occlusive central large clot burden in the right hilum. Nonocclusive thrombus in the central left pulmonary arteries. IMPRESSION: Successful bilateral pulmonary artery catheterizations, angiograms, and insertion of lysis catheters to begin PE thrombo lysis protocol. Electronically Signed   By: Judie Petit.  Shick M.D.  On: 01/31/2019 20:48   IR Angiogram Selective Each Additional Vessel  Result Date: 01/22/2019 INDICATION: Acute bilateral occlusive pulmonary emboli EXAM: Ultrasound guidance for vascular access Bilateral pulmonary artery catheterizations, angiograms, and insertion of infusion catheters for PE thrombolysis COMPARISON:  11/23/2018 MEDICATIONS: Patient is currently on ECMO ANESTHESIA/SEDATION: The patient was continuously monitored during the procedure by the ECMO team FLUOROSCOPY TIME:  Fluoroscopy Time: 14 minutes 12 seconds (822 mGy). COMPLICATIONS: None immediate. TECHNIQUE: Informed written consent was obtained from the patient's family after a thorough discussion of the procedural risks, benefits and alternatives. All questions were addressed. Maximal Sterile Barrier Technique was utilized including caps, mask, sterile gowns, sterile gloves, sterile drape, hand hygiene and skin antiseptic. A timeout was performed prior to the initiation of the procedure. Under sterile conditions, the existing 8 French sheath in the right internal jugular vein was exchanged over 2 guidewires. Two adjacent 6 French sheaths were inserted. Initially, a 6 French angle pigtail catheter was advanced through 1 of the 6 French sheaths into the right heart. This was advanced through the right heart outflow into the pulmonary arteries and initially position of the right pulmonary artery. Pulmonary angiogram performed. Initial pulmonary angiogram: Occlusive filling defects noted in the proximal right pulmonary artery. Nonocclusive  filling defects in the left pulmonary artery. Over Rosen guidewire, a 10 cm infusion catheter was advanced into the right pulmonary artery extending into the lower lobe branches. Contrast injection confirms position. Inner occlusion wire advanced. Position confirmed with fluoroscopy. In a similar fashion through the second right IJ adjacent 6 French sheath, the angled 6 French pigtail catheter was advanced and positioned in the right heart initially. This was advanced through the right heart outflow into the main pulmonary artery. Catheter was exchanged for a Kumpe catheter. Kumpe catheter was directed to the left pulmonary artery. Over the [facility] guidewire, a second 10 cm infusion catheter was advanced into the left pulmonary artery. Position confirmed with contrast injection. Inner occlusion wire advanced. Catheter positions confirmed with fluoroscopy. Access site secured to the right neck with a sterile dressing. PE thrombo lysis protocol will be initiated through both catheters for the standard protocol infusion. FINDINGS: Bilateral pulmonary angiograms confirm occlusive central large clot burden in the right hilum. Nonocclusive thrombus in the central left pulmonary arteries. IMPRESSION: Successful bilateral pulmonary artery catheterizations, angiograms, and insertion of lysis catheters to begin PE thrombo lysis protocol. Electronically Signed   By: Judie Petit.  Shick M.D.   On: 01/19/2019 20:48   IR Angiogram Selective Each Additional Vessel  Result Date: 01/21/2019 INDICATION: Acute bilateral occlusive pulmonary emboli EXAM: Ultrasound guidance for vascular access Bilateral pulmonary artery catheterizations, angiograms, and insertion of infusion catheters for PE thrombolysis COMPARISON:  11/23/2018 MEDICATIONS: Patient is currently on ECMO ANESTHESIA/SEDATION: The patient was continuously monitored during the procedure by the ECMO team FLUOROSCOPY TIME:  Fluoroscopy Time: 14 minutes 12 seconds (822 mGy).  COMPLICATIONS: None immediate. TECHNIQUE: Informed written consent was obtained from the patient's family after a thorough discussion of the procedural risks, benefits and alternatives. All questions were addressed. Maximal Sterile Barrier Technique was utilized including caps, mask, sterile gowns, sterile gloves, sterile drape, hand hygiene and skin antiseptic. A timeout was performed prior to the initiation of the procedure. Under sterile conditions, the existing 8 French sheath in the right internal jugular vein was exchanged over 2 guidewires. Two adjacent 6 French sheaths were inserted. Initially, a 6 French angle pigtail catheter was advanced through 1 of the 6 French sheaths into the right heart.  This was advanced through the right heart outflow into the pulmonary arteries and initially position of the right pulmonary artery. Pulmonary angiogram performed. Initial pulmonary angiogram: Occlusive filling defects noted in the proximal right pulmonary artery. Nonocclusive filling defects in the left pulmonary artery. Over Rosen guidewire, a 10 cm infusion catheter was advanced into the right pulmonary artery extending into the lower lobe branches. Contrast injection confirms position. Inner occlusion wire advanced. Position confirmed with fluoroscopy. In a similar fashion through the second right IJ adjacent 6 French sheath, the angled 6 French pigtail catheter was advanced and positioned in the right heart initially. This was advanced through the right heart outflow into the main pulmonary artery. Catheter was exchanged for a Kumpe catheter. Kumpe catheter was directed to the left pulmonary artery. Over the [facility] 2 guidewire, a second 10 cm infusion catheter was advanced into the left pulmonary artery. Position confirmed with contrast injection. Inner occlusion wire advanced. Catheter positions confirmed with fluoroscopy. Access site secured to the right neck with a sterile dressing. PE thrombo lysis protocol will  be initiated through both catheters for the standard protocol infusion. FINDINGS: Bilateral pulmonary angiograms confirm occlusive central large clot burden in the right hilum. Nonocclusive thrombus in the central left pulmonary arteries. IMPRESSION: Successful bilateral pulmonary artery catheterizations, angiograms, and insertion of lysis catheters to begin PE thrombo lysis protocol. Electronically Signed   By: Judie Petit.  Shick M.D.   On: 02/15/2019 20:48   DG CHEST PORT 1 VIEW  Result Date: 01/26/2019 CLINICAL DATA:  Central line placement, intubation, ECMO EXAM: PORTABLE CHEST 1 VIEW COMPARISON:  Same day chest radiograph, 11/26/2018 FINDINGS: Interval placement of a left neck vascular catheter and sheath, tip projecting over the left brachiocephalic vein. Unchanged right neck vascular catheter. Inferior approach venous ECMO cannula over the right atrium. Endotracheal tube in unchanged position. Cardiomegaly. Bilateral pleural effusions and associated atelectasis or consolidation. No new airspace opacity. IMPRESSION: 1. Interval placement of a left neck vascular catheter and sheath, tip projecting over the left brachiocephalic vein. 2. Otherwise unchanged AP portable examination with extensive support apparatus including endotracheal tube and ECMO. Electronically Signed   By: Lauralyn Primes M.D.   On: 01/26/2019 12:35   DG Chest Port 1 View  Result Date: 01/26/2019 CLINICAL DATA:  Endotracheal tube present. On ECMO. EXAM: PORTABLE CHEST 1 VIEW COMPARISON:  Radiograph yesterday. FINDINGS: Endotracheal tube tip 3.8 cm from the carina. Bilateral internal jugular sheaths in place. ECMO catheter in the right axilla and projecting over the atrial SVC junction. Lower lung volumes from prior exam. Unchanged heart size and mediastinal contours. Developing hazy bibasilar opacities, left greater than right. No pneumothorax. IMPRESSION: 1. Endotracheal tube 3.8 cm from the carina. 2. Bilateral internal jugular sheaths.   ECMO catheters in place. 3. Lower lung volumes since yesterday with developing hazy bibasilar opacities, left greater than right, likely pleural effusions and adjacent atelectasis. Electronically Signed   By: Narda Rutherford M.D.   On: 01/26/2019 06:04   DG Chest Port 1 View  Result Date: 01/25/2019 CLINICAL DATA:  Endotracheal tube present. Patient on neck mass. EXAM: PORTABLE CHEST 1 VIEW COMPARISON:  Radiograph yesterday. FINDINGS: Endotracheal tube tip 2 cm from the carina. Bilateral pulmonary thrombolysis infusion catheters femoral right internal jugular approach unchanged in position from prior exam. Left internal jugular cannula in the region of the upper mediastinum, unchanged. Right upper extremity ECMO catheter in the region of the axilla. X catheter from an inferior approach in the atrial SVC junction. Unchanged heart size  and mediastinal contours. Slight worsening retrocardiac opacity. No pneumothorax. No evidence of pleural effusion. IMPRESSION: 1. Support apparatus as described. 2. Slight worsening hazy opacity at the left lung base. Electronically Signed   By: Narda Rutherford M.D.   On: 01/25/2019 06:26   DG CHEST PORT 1 VIEW  Result Date: 2019-01-25 CLINICAL DATA:  ECMO EXAM: PORTABLE CHEST 1 VIEW COMPARISON:  Jan 25, 2019 FINDINGS: The endotracheal tube terminates above the carina by approximately 3.6 cm. There are bilateral pulmonary thrombolysis infusion catheters. A left-sided central venous catheter is noted. The heart size is enlarged. Bilateral airspace opacities are again noted. A stent is partially visualized projecting over the patient's right axillary region. IMPRESSION: 1. Lines and tubes as above. The bilateral UniFuse catheters appear to be relatively stable when compared to postprocedural imaging earlier in the day. 2. Persistent patchy bilateral airspace opacities as seen on prior CT. Electronically Signed   By: Katherine Mantle M.D.   On: 2019-01-25 20:49   DG Chest  Portable 1 View  Result Date: 2019-01-25 CLINICAL DATA:  ECMO EXAM: PORTABLE CHEST 1 VIEW COMPARISON:  Portable exam 1702 hours compared to 01/28/2019 FINDINGS: Tip of endotracheal tube projects approximately 3.1 cm above carina. Large-bore catheter projects over SVC, RIGHT atrium and IVC. BILATERAL jugular lines. RIGHT upper extremity line versus graft. Stable heart size. Atelectasis versus infiltrate LEFT upper lobe. Remaining lungs clear. No pleural effusion or pneumothorax. IMPRESSION: LEFT upper lobe atelectasis versus consolidation. Lines and tubes as above. Electronically Signed   By: Ulyses Southward M.D.   On: 01-25-19 17:19   DG Chest Portable 1 View  Result Date: 02/10/2019 CLINICAL DATA:  Status post intubation, recent CPR EXAM: PORTABLE CHEST 1 VIEW COMPARISON:  Film from earlier in the same day. FINDINGS: Patchy opacity is again noted in the left mid lung stable from the prior exam. Lungs are otherwise clear. Endotracheal tube is noted in satisfactory position. A rounded metallic artifact is noted overlying the upper chest related to patient's hand. No other focal abnormality is noted. IMPRESSION: Stable opacity over the left lung. Endotracheal tube in satisfactory position. Extrinsic artifact over the upper chest. Electronically Signed   By: Alcide Clever M.D.   On: 02/07/2019 20:52   DG Chest Portable 1 View  Result Date: 02/04/2019 CLINICAL DATA:  Shortness of breath increasing over the past few hours EXAM: PORTABLE CHEST 1 VIEW COMPARISON:  01/27/2007 FINDINGS: Cardiac shadow is stable. Tortuosity of the thoracic aorta is again seen. Patchy left perihilar infiltrate is noted. No sizable effusion is seen. Old rib fractures on the right are seen. IMPRESSION: Patchy left perihilar infiltrate. Followup films following appropriate therapy are recommended. Electronically Signed   By: Alcide Clever M.D.   On: 01/19/2019 17:08   DG C-Arm 1-60 Min-No Report  Result Date: 01-25-19 Fluoroscopy was  utilized by the requesting physician.  No radiographic interpretation.   ECHOCARDIOGRAM COMPLETE  Result Date: January 25, 2019   ECHOCARDIOGRAM REPORT   Patient Name:   Philip Wolfe Date of Exam: 01-25-2019 Medical Rec #:  [id number]     Height:       69.0 in Accession #:    [id number]    Weight:       323.0 lb Date of Birth:  04-18-1950      BSA:          2.53 m Patient Age:    68 years      BP:  91/55 mmHg Patient Gender: M             HR:           99 bpm. Exam Location:  Inpatient Procedure: 2D Echo STAT ECHO Indications:    pulmonary embolus  History:        Patient has no prior history of Echocardiogram examinations.                 Risk Factors:Hypertension.  Sonographer:    Celene Skeen RDCS (AE) Referring Phys: [id number] Tobey Grim  Sonographer Comments: Technically difficult study due to poor echo windows, no parasternal window, suboptimal apical window, patient is morbidly obese and echo performed with patient supine and on artificial respirator. Image acquisition challenging due to patient body habitus and Image acquisition challenging due to respiratory motion. IMPRESSIONS  1. Global right ventricle has moderately reduced systolic function.The right ventricular size is mildly enlarged. No increase in right ventricular wall thickness. D-shaped interventricular septum suggesteive of RV pressure/volume overload. Difficult images but the free wall appears hypokinetic with preservation of the RV apical function (McConnell's sign), suggestive of hemodynamically significant PE.  2. Left ventricular ejection fraction, by visual estimation, is 45 to 50%. The left ventricle has mildly decreased function. There is mildly increased left ventricular hypertrophy. Extremely difficult images of the LV, Definity was not used. The above EF is a gross estimate. Cannot comment on motion of individual walls.  3. Left ventricular diastolic parameters are indeterminate.  4. Mild mitral annular calcification.  5.  The mitral valve is normal in structure. Mild mitral valve regurgitation. No evidence of mitral stenosis.  6. The pulmonic valve was not assessed. Pulmonic valve regurgitation is not visualized.  7. The tricuspid valve is not well visualized. Tricuspid valve regurgitation is not demonstrated.  8. The aortic valve was not well visualized. Aortic valve regurgitation is not visualized.  9. The aortic root was not well visualized. 10. The interatrial septum was not well visualized. 11. Right atrial size was not well visualized. 12. Left atrial size was not well visualized. 13. Technically extremely difficult study. FINDINGS  Left Ventricle: Left ventricular ejection fraction, by visual estimation, is 45 to 50%. The left ventricle has mildly decreased function. The left ventricle is not well visualized. The left ventricular internal cavity size was the left ventricle is normal in size. There is mildly increased left ventricular hypertrophy. Left ventricular diastolic parameters are indeterminate. Right Ventricle: The right ventricular size is mildly enlarged. No increase in right ventricular wall thickness. Global RV systolic function is has moderately reduced systolic function. Left Atrium: Left atrial size was not well visualized. Right Atrium: Right atrial size was not well visualized Pericardium: There is no evidence of pericardial effusion. Mitral Valve: The mitral valve is normal in structure. Mild mitral annular calcification. No evidence of mitral valve stenosis by observation. Mild mitral valve regurgitation. Tricuspid Valve: The tricuspid valve is not well visualized. Tricuspid valve regurgitation is not demonstrated. Aortic Valve: The aortic valve was not well visualized. Aortic valve regurgitation is not visualized. Pulmonic Valve: The pulmonic valve was not assessed. Pulmonic valve regurgitation is not visualized. Aorta: The aortic root was not well visualized. Venous: The inferior vena cava was not well  visualized. IAS/Shunts: The interatrial septum was not well visualized.  Marca Ancona MD Electronically signed by Marca Ancona MD Signature Date/Time: 01/29/2019/10:13:07 AM    Final    ECHOCARDIOGRAM LIMITED  Result Date: 01/26/2019   ECHOCARDIOGRAM LIMITED REPORT   Patient  Name:   Philip Wolfe Date of Exam: 01/26/2019 Medical Rec #:  [id number]     Height:       69.0 in Accession #:    [id number]    Weight:       335.5 lb Date of Birth:  07/30/1950      BSA:          2.57 m Patient Age:    68 years      BP:           116/72 mmHg Patient Gender: M             HR:           89 bpm. Exam Location:  Inpatient  Procedure: Limited Echo Indications:    Pulmonary embolism  History:        Patient has prior history of Echocardiogram examinations, most                 recent 01/25/2019. [facility] ECMO, Cardiogenic shock, pulmonary                 embolism, resp. failure.  Sonographer:    Lavenia AtlasBrooke Strickland Referring Phys: [phone] DALTON S MCLEAN  Sonographer Comments: Technically difficult study due to poor echo windows, Technically challenging study due to limited acoustic windows and patient is morbidly obese. Image acquisition challenging due to patient body habitus. IMPRESSIONS  1. Extremely limited; LV and RV function cannot be determined with this study; no pericardial effusion; no other useful information obtained; suggest TEE if clinically indicated. FINDINGS  Left Ventricle: Left ventricular ejection fraction, by visual estimation, is Unable to quantitate%. The left ventricle has unable to quantitate function. The left ventricle is not well visualized. There is no left ventricular hypertrophy. Right Ventricle: The right ventricular size is not well visualized. Right vetricular wall thickness was not assessed. Global RV systolic function is was not assessed. Left Atrium: Left atrial size was not assessed. Right Atrium: Right atrial size was not assessed Pericardium: There is no evidence of pericardial effusion. Mitral  Valve: The mitral valve was not well visualized. Not assessed mitral valve regurgitation. Tricuspid Valve: The tricuspid valve is not well visualized. Tricuspid valve regurgitation Not assessed. Aortic Valve: The aortic valve was not well visualized. Aortic valve regurgitation not assessed. Pulmonic Valve: The pulmonic valve was not well visualized. Pulmonic valve regurgitation Not assessed. Aorta: The aortic root is normal in size and structure. Shunts: The interatrial septum was not assessed.  Olga MillersBrian Crenshaw MD Electronically signed by Olga MillersBrian Crenshaw MD Signature Date/Time: 01/26/2019/12:06:32 PM    Final    ECHOCARDIOGRAM LIMITED  Result Date: 01/25/2019   ECHOCARDIOGRAM LIMITED REPORT   Patient Name:   Philip Wolfe Date of Exam: 01/25/2019 Medical Rec #:  [id number]     Height:       69.0 in Accession #:    [id number]    Weight:       326.7 lb Date of Birth:  12/23/1950      BSA:          2.55 m Patient Age:    68 years      BP:           98/75 mmHg Patient Gender: M             HR:           88 bpm. Exam Location:  Inpatient  Procedure: Limited Echo Indications:    Pulmonary embolus 415.19  History:  Patient has prior history of Echocardiogram examinations, most                 recent 01/31/2019.  Sonographer:    Tonia Ghent RDCS Referring Phys: [id number] DALTON S MCLEAN IMPRESSIONS  1. Global right ventricle has severely reduced systolic function.The right ventricular size is severely enlarged. No increase in right ventricular wall thickness.  2. Left ventricular ejection fraction, by visual estimation, is 50 to 55%. The left ventricle has low normal function. There is no left ventricular hypertrophy.  3. TR signal is inadequate for assessing pulmonary artery systolic pressure.  4. Right ventricular volume and pressure overload.  5. The left ventricle demonstrates regional wall motion abnormalities.  6. Presence of pericardial fat pad.  7. Trivial pericardial effusion is present.  8. The tricuspid valve  is grossly normal. Tricuspid valve regurgitation is trivial.  9. No significant change with 01/26/2019. FINDINGS  Left Ventricle: Left ventricular ejection fraction, by visual estimation, is 50 to 55%. The left ventricle has low normal function. The left ventricle demonstrates regional wall motion abnormalities. There is no left ventricular hypertrophy. The interventricular septum is flattened in systole and diastole, consistent with right ventricular pressure and volume overload. Left ventricular diastolic function could not be evaluated. Right Ventricle: The right ventricular size is severely enlarged. No increase in right ventricular wall thickness. Global RV systolic function is has severely reduced systolic function. Left Atrium: Left atrial size was normal in size. Right Atrium: Right atrial size was normal in size Pericardium: Trivial pericardial effusion is present. Presence of pericardial fat pad. Mitral Valve: The mitral valve is grossly normal. NWV mitral valve regurgitation. Tricuspid Valve: The tricuspid valve is grossly normal. Tricuspid valve regurgitation is trivial. Aortic Valve: The aortic valve is tricuspid. Aortic valve regurgitation is not visualized. The aortic valve is structurally normal, with no evidence of sclerosis or stenosis. Pulmonic Valve: The pulmonic valve was not well visualized. Pulmonic valve regurgitation NWV. Aorta: The aortic root is normal in size and structure. Venous: The inferior vena cava was not well visualized. Shunts: The interatrial septum was not well visualized.  Lennie Odor MD Electronically signed by Lennie Odor MD Signature Date/Time: 01/25/2019/3:59:24 PM    Final    IR INFUSION THROMBOL ARTERIAL INITIAL (MS)  Result Date: 02/08/2019 INDICATION: Acute bilateral occlusive pulmonary emboli EXAM: Ultrasound guidance for vascular access Bilateral pulmonary artery catheterizations, angiograms, and insertion of infusion catheters for PE thrombolysis COMPARISON:   11/23/2018 MEDICATIONS: Patient is currently on ECMO ANESTHESIA/SEDATION: The patient was continuously monitored during the procedure by the ECMO team FLUOROSCOPY TIME:  Fluoroscopy Time: 14 minutes 12 seconds (822 mGy). COMPLICATIONS: None immediate. TECHNIQUE: Informed written consent was obtained from the patient's family after a thorough discussion of the procedural risks, benefits and alternatives. All questions were addressed. Maximal Sterile Barrier Technique was utilized including caps, mask, sterile gowns, sterile gloves, sterile drape, hand hygiene and skin antiseptic. A timeout was performed prior to the initiation of the procedure. Under sterile conditions, the existing 8 French sheath in the right internal jugular vein was exchanged over 2 guidewires. Two adjacent 6 French sheaths were inserted. Initially, a 6 French angle pigtail catheter was advanced through 1 of the 6 French sheaths into the right heart. This was advanced through the right heart outflow into the pulmonary arteries and initially position of the right pulmonary artery. Pulmonary angiogram performed. Initial pulmonary angiogram: Occlusive filling defects noted in the proximal right pulmonary artery. Nonocclusive filling defects in the left pulmonary  artery. Over Rosen guidewire, a 10 cm infusion catheter was advanced into the right pulmonary artery extending into the lower lobe branches. Contrast injection confirms position. Inner occlusion wire advanced. Position confirmed with fluoroscopy. In a similar fashion through the second right IJ adjacent 6 French sheath, the angled 6 French pigtail catheter was advanced and positioned in the right heart initially. This was advanced through the right heart outflow into the main pulmonary artery. Catheter was exchanged for a Kumpe catheter. Kumpe catheter was directed to the left pulmonary artery. Over the [facility] guidewire, a second 10 cm infusion catheter was advanced into the left pulmonary  artery. Position confirmed with contrast injection. Inner occlusion wire advanced. Catheter positions confirmed with fluoroscopy. Access site secured to the right neck with a sterile dressing. PE thrombo lysis protocol will be initiated through both catheters for the standard protocol infusion. FINDINGS: Bilateral pulmonary angiograms confirm occlusive central large clot burden in the right hilum. Nonocclusive thrombus in the central left pulmonary arteries. IMPRESSION: Successful bilateral pulmonary artery catheterizations, angiograms, and insertion of lysis catheters to begin PE thrombo lysis protocol. Electronically Signed   By: Judie Petit.  Shick M.D.   On: 01/23/2019 20:48   IR INFUSION THROMBOL ARTERIAL INITIAL (MS)  Result Date: 01/19/2019 INDICATION: Acute bilateral occlusive pulmonary emboli EXAM: Ultrasound guidance for vascular access Bilateral pulmonary artery catheterizations, angiograms, and insertion of infusion catheters for PE thrombolysis COMPARISON:  11/23/2018 MEDICATIONS: Patient is currently on ECMO ANESTHESIA/SEDATION: The patient was continuously monitored during the procedure by the ECMO team FLUOROSCOPY TIME:  Fluoroscopy Time: 14 minutes 12 seconds (822 mGy). COMPLICATIONS: None immediate. TECHNIQUE: Informed written consent was obtained from the patient's family after a thorough discussion of the procedural risks, benefits and alternatives. All questions were addressed. Maximal Sterile Barrier Technique was utilized including caps, mask, sterile gowns, sterile gloves, sterile drape, hand hygiene and skin antiseptic. A timeout was performed prior to the initiation of the procedure. Under sterile conditions, the existing 8 French sheath in the right internal jugular vein was exchanged over 2 guidewires. Two adjacent 6 French sheaths were inserted. Initially, a 6 French angle pigtail catheter was advanced through 1 of the 6 French sheaths into the right heart. This was advanced through the right  heart outflow into the pulmonary arteries and initially position of the right pulmonary artery. Pulmonary angiogram performed. Initial pulmonary angiogram: Occlusive filling defects noted in the proximal right pulmonary artery. Nonocclusive filling defects in the left pulmonary artery. Over Rosen guidewire, a 10 cm infusion catheter was advanced into the right pulmonary artery extending into the lower lobe branches. Contrast injection confirms position. Inner occlusion wire advanced. Position confirmed with fluoroscopy. In a similar fashion through the second right IJ adjacent 6 French sheath, the angled 6 French pigtail catheter was advanced and positioned in the right heart initially. This was advanced through the right heart outflow into the main pulmonary artery. Catheter was exchanged for a Kumpe catheter. Kumpe catheter was directed to the left pulmonary artery. Over the [facility] guidewire, a second 10 cm infusion catheter was advanced into the left pulmonary artery. Position confirmed with contrast injection. Inner occlusion wire advanced. Catheter positions confirmed with fluoroscopy. Access site secured to the right neck with a sterile dressing. PE thrombo lysis protocol will be initiated through both catheters for the standard protocol infusion. FINDINGS: Bilateral pulmonary angiograms confirm occlusive central large clot burden in the right hilum. Nonocclusive thrombus in the central left pulmonary arteries. IMPRESSION: Successful bilateral pulmonary artery catheterizations, angiograms, and  insertion of lysis catheters to begin PE thrombo lysis protocol. Electronically Signed   By: Judie Petit.  Shick M.D.   On: 02/14/2019 20:48   IR THROMB F/U EVAL ART/VEN FINAL DAY (MS)  Result Date: 01/25/2019 INDICATION: Massive pulmonary embolism requiring 2 doses of systemic tPA, ultimately placed on ECMO, followed by initiation of bilateral pulmonary arterial catheter directed thrombolysis. EXAM: IR THROMB F/U EVAL ART/VEN  FINAL DAY COMPARISON:  Initiation of bilateral pulmonary arterial catheter directed thrombolysis-02/16/2019; chest CT-02/06/2019 MEDICATIONS: None ANESTHESIA/SEDATION: None CONTRAST:  None COMPLICATIONS: None immediate. PROCEDURE: Pressure measurements were NOT acquired at the time of initiation of bilateral pulmonary arterial catheter directed thrombolysis as the patient was on ECMO. As such, prolonged conversations were held with the ICU staff regarding continuing bilateral pulmonary arterial directed thrombolysis for a time period greater than the standard 12 hours versus terminating the procedure. Following this prolonged detailed conversation, the decision was made to terminate the procedure. As pressure measurements were not obtained at the initiation of the procedure, postprocedural pressure make sense for also not obtained. All wires and catheters removed from the right jugular access site. One of the two vascular sheaths was left in place for continued durable intravenous access per request from the ICU staff. IMPRESSION: Technically successful completion of 12 hour bilateral catheter directed thrombolysis for massive pulmonary embolism. Electronically Signed   By: Simonne Come M.D.   On: 01/25/2019 15:43    Labs:  CBC: Recent Labs    01/25/19 2205 01/26/19 0200 01/26/19 0836 01/26/19 1306 01/26/19 1421 01/26/19 1430  WBC 35.9* 36.0* 34.5*  --   --  35.6*  HGB 8.5* 8.0* 7.7* 7.1* 7.1* 8.6*  HCT 24.1* 22.3* 22.5* 21.0* 21.0* 25.2*  PLT 72* 70* 65*  --   --  63*    COAGS: Recent Labs    02/14/2019 1111 02/10/2019 1736 01/25/19 0611 01/25/19 1315 01/25/19 1834 01/26/19 0200  INR 1.6* 2.0* 2.4*  --   --  1.5*  APTT  --  138* 42* 93* 115* 104*    BMP: Recent Labs    01/17/2019 2059 01/25/19 0200 01/25/19 1703 01/26/19 0200 01/26/19 0356 01/26/19 0555 01/26/19 1306 01/26/19 1421  NA 142 142 142 140 142 143 142 143  K 2.8* 3.3* 3.7 3.3* 2.9* 2.9* 3.7 3.3*  CL 105 107 106 107   --   --   --   --   CO2 --   --   --   --   GLUCOSE 280* 255* 204* 211*  --   --   --   --   BUN 55* 55* 45* 43*  --   --   --   --   CALCIUM 8.0* 7.0* 6.6* 6.4*  --   --   --   --   CREATININE 2.32* 1.91* 1.52* 1.37*  --   --   --   --   GFRNONAA 28* 35* 47* 53*  --   --   --   --   GFRAA 32* 41* 54* >60  --   --   --   --     LIVER FUNCTION TESTS: Recent Labs    01/19/2019 1504 01/25/19 1703 01/26/19 0200  BILITOT 0.4 1.0 1.1  AST 42* 50* 45*  ALT 39 33 32  ALKPHOS 91 34* 35*  PROT 8.1 3.9* 3.6*  ALBUMIN 4.0 2.2* 2.0*    Assessment and Plan: Pulmonary embolus s/p lysis 12/8 Patient still with significant  clot burden despite systemic and catheter-directed tPA.  He does have oropharyngeal bleeding managed with several blood transfusions-- 3u 12/9 and nasal packing by ENT, 1u this AM for HgB of 5.5. Remains on pressors.  TCTS and CCM have discussed with patient's wife who wishes to proceed with additional catheter-directed therapy.   Emergency consent obtained as wife not available despite several attempts to reach by phone, all waiting rooms checked. Risks and benefits discussed 12/8 for initial catheter-directed treatment to include, but not limited to, bleeding, possible life threatening bleeding and need for blood product transfusion, vascular injury, stroke, contrast induced renal failure, limb loss and infection. Extensive discussed held between wife and CCM/TCTS.  Plan made to move forward with second PE lysis due to critical illness with ongoing significant clot burden.   Electronically Signed: Hoyt Koch, PA 01/26/2019, 3:06 PM   I spent a total of 15 Minutes at the the patient's bedside AND on the patient's hospital floor or unit, greater than 50% of which was counseling/coordinating care for pulmonary embolus.

## 2019-01-26 NOTE — Progress Notes (Signed)
NAME:  Philip Wolfe, MRN:  [id number], DOB:  Jun 30, 1950, LOS: 3 ADMISSION DATE:  02/11/2019, CONSULTATION DATE:  02/04/2019 REFERRING MD:  Dr. Ralene Bathe EDP, CHIEF COMPLAINT:  Hypoxia   Brief History   68 year old male admitted 12/7 with massive PE s/p thrombolytics and catheter directed lysis.  Has cardiac arrest, significant RV failure requiring initiation of ECMO  Past Medical History   has a past medical history of Arthritis, Hypertension, Sleep apnea, Ulcer (left medial ankle), and Venous stasis ulcer ([place]).   Significant Hospital Events   12/7-Admit, intubated coded, given TPA 800 mg and then 50 mg 6 hours later 12/8- Cannulated for [facility] ECMO 01/25/2019 and underwent EKOS directed catheter placement.  Inhaled NO started 12/9- Significant epistaxis requiring 3 units PRBC and nasal packing by ENT  Consults:  PCCM, cardiology, cardiothoracic surgery  Procedures:  R axillary arterial catheter, R femoral venous catheter, RIJ introducer, LIJ MML, L radial artery  Significant Diagnostic Tests:  CTA 12/7-massive bilateral central pulmonary emboli with significant decrease in pulmonary artery flow, patchy groundglass infiltrate, rib fractures on the right.  CT head 12/7-generalized atrophy, chronic ischemic microangiopathy.  Echo 12/9-severe reduction in RV systolic function with RV volume and pressure overload.  LVEF 50-55%  Micro Data:    Antimicrobials:  Cefepime 12/10 > Vanco 12/9  Interim history/subjective:  Remains on ECMO Continues to have nasal pharyngeal bleeding improved volumes after packing Continues on inhaled nitric, pressors- epi, norepi, vasopressin  Objective   Blood pressure 116/72, pulse 89, temperature 98.4 F (36.9 C), resp. rate 10, height 5' 9.02" (1.753 m), weight (!) 152.2 kg, SpO2 100 %. CVP:  [1 mmHg-9 mmHg] 7 mmHg  Vent Mode: PRVC FiO2 (%):  [40 %] 40 % Set Rate:  [10 bmp] 10 bmp Vt Set:  [450 mL] 450 mL PEEP:  [5 cmH20] 5 cmH20 Plateau Pressure:  [15  cmH20-18 cmH20] 18 cmH20   Intake/Output Summary (Last 24 hours) at 01/26/2019 0093 Last data filed at 01/26/2019 0700 Gross per 24 hour  Intake 4968.95 ml  Output 2850 ml  Net 2118.95 ml   Filed Weights   01/19/2019 1100 01/25/19 0500 01/26/19 0344  Weight: (!) 146.5 kg (!) 148.2 kg (!) 152.2 kg    Examination: Gen:      No acute distress, obese,  HEENT:  EOMI, sclera anicteric Neck:     No masses; no thyromegaly, ETT Lungs:    Clear to auscultation bilaterally; normal respiratory effort CV:         Regular rate and rhythm; no murmurs Abd:      + bowel sounds; soft, non-tender; no palpable masses, no distension Ext:    No edema; adequate peripheral perfusion Skin:      Warm and dry; no rash Neuro: Sedated, unresponsive  Chest x-ray 12/10 reviewed with bibasal atelectasis, mild effusion.  Resolved Hospital Problem list     Assessment & Plan:  Acute cor pulmonale due to massive PE- s/p 150mg  total tPA during 1st day of hospitalization and another ~12mg  via catheter-directed therapy.  S/P VA ECMO axillary-femoral cannulation. Going to go down for another CTA to reevaluate the clot burden May have to decide on thrombectomy versus repeat TPA based on the findings Continue pressor support.  Inhaled NO increase today Continue heparin drip  Acute hypoxic, hypercarbic respiratory failure Continue 6 cc/kg protective lung ventilation Follow ABG Continue empiric ceftriaxone for leukocytosis Starting paralytics for dyssynchrony  Epistaxis S/p packing.  Continue monitoring CBC, transfuse as needed  Hypoglycemia Continue insulin drip  Discussed with cardiothoracic surgery  Best practice:  Diet: N.p.o. Pain/Anxiety/Delirium protocol (if indicated): Precedex, Versed, fentanyl, paralytic VAP protocol (if indicated): Ordered DVT prophylaxis: Heparin Drip GI prophylaxis: Pepcid, Protonix Glucose control: Insulin drip Mobility: Bed Code Status: Full Family Communication:  Updated at bedside Disposition: ICU  Critical care time:     The patient is critically ill with multiple organ system failure and requires high complexity decision making for assessment and support, frequent evaluation and titration of therapies, advanced monitoring, review of radiographic studies and interpretation of complex data.   Critical Care Time devoted to patient care services, exclusive of separately billable procedures, described in this note is 45 minutes.   Chilton Greathouse MD [facility] Pager [phone] If no answer call [phone] 01/26/2019, 9:20 AM

## 2019-01-26 NOTE — Procedures (Signed)
Interventional Radiology Procedure:   Indications: History of massive PE and cardiogenic shock.  Procedure: Bilateral PA arteriograms and placement of bilateral PA infusion catheters.  Initiation of 12 hour tPA infusion thru bilateral catheters.  Findings: Large clot burden in right PA (slightly improved flow to right lower lung since prior exam).  Placed right PA catheter in main branch extending into right upper lobe.  Placed left PA catheter in LLL branch  Complications: None     EBL: less than 20 ml  Plan: Plan for 12 hour infusion through bilateral catheters at 1 mg/hour each (total of 2 mg/hr).   Joselynne Killam R. Anselm Pancoast, MD  Pager: [phone]

## 2019-01-26 NOTE — Progress Notes (Signed)
Patient ID: Philip Wolfe, male   DOB: 29-Sep-1950, 68 y.o.   MRN: [id number] T[facility] Evening Rounds:  Hemodynamically stable on ECMO Epi 25, NE 10, Vaso 0.03  Sedated and paralyzed on vent Diuresing well with lasix drip and repleting K+  Had repeat pulmonary angio this afternoon. Final report pending but still heavy clot burden in right PA with poor distal perfusion. Left side looks better. Has bilateral PA catheters in with tPA going.  NIRs of RLE lower than other extremities but looks unremarkable and doppler signal present.  Hgb 7.7 this pm so will transfuse a unit of PRBC's. Plts down slightly to 57.  No active bleeding.

## 2019-01-26 NOTE — Progress Notes (Signed)
2 Days Post-Op Procedure(s) (LRB): CANNULATION FOR VA ECMO (EXTRACORPOREAL MEMBRANE OXYGENATION) (N/A) TRANSESOPHAGEAL ECHOCARDIOGRAM (TEE) (N/A) Subjective: Intubated/sedated  Objective: Vital signs in last 24 hours: Temp:  [98.1 F (36.7 C)-100 F (37.8 C)] 98.4 F (36.9 C) (12/10 0755) Pulse Rate:  [80-93] 89 (12/10 0755) Cardiac Rhythm: Normal sinus rhythm (12/10 0400) Resp:  [0-24] 10 (12/10 0755) BP: (115-119)/(72-75) 116/72 (12/10 0432) SpO2:  [99 %-100 %] 100 % (12/10 0755) Arterial Line BP: (90-249)/(68-243) 118/72 (12/10 0700) FiO2 (%):  [40 %] 40 % (12/10 0755) Weight:  [152.2 kg] 152.2 kg (12/10 0344)  Hemodynamic parameters for last 24 hours: CVP:  [1 mmHg-9 mmHg] 7 mmHg  Intake/Output from previous day: 12/09 0701 - 12/10 0700 In: 5434.2 [I.V.:4229.8; Blood:630; IV Piggyback:574.5] Out: 2950 [Urine:2050] Intake/Output this shift: No intake/output data recorded.  General appearance: intubated/sedated Neurologic: unable to fully assess Heart: regular rate and rhythm, S1, S2 normal, no murmur, click, rub or gallop Lungs: diminished breath sounds bilaterally Abdomen: obese, soft Extremities: edema 3+ Wound: dry  Lab Results: Recent Labs    01/25/19 2205 01/26/19 0200 01/26/19 0356 01/26/19 0555  WBC 35.9* 36.0*  --   --   HGB 8.5* 8.0* 5.8* 5.8*  HCT 24.1* 22.3* 17.0* 17.0*  PLT 72* 70*  --   --    BMET:  Recent Labs    01/25/19 1703 01/26/19 0200 01/26/19 0356 01/26/19 0555  NA 142 140 142 143  K 3.7 3.3* 2.9* 2.9*  CL 106 107  --   --   CO2 25 25  --   --   GLUCOSE 204* 211*  --   --   BUN 45* 43*  --   --   CREATININE 1.52* 1.37*  --   --   CALCIUM 6.6* 6.4*  --   --     PT/INR:  Recent Labs    01/26/19 0200  LABPROT 18.0*  INR 1.5*   ABG    Component Value Date/Time   PHART 7.461 (H) 01/26/2019 0555   HCO3 21.1 01/26/2019 0555   TCO2 22 01/26/2019 0555   ACIDBASEDEF 3.0 (H) 01/26/2019 0555   O2SAT 100.0 01/26/2019 0555    CBG (last 3)  Recent Labs    01/26/19 0500 01/26/19 0552 01/26/19 0653  GLUCAP 188* 174* 189*    Assessment/Plan: S/P Procedure(s) (LRB): CANNULATION FOR VA ECMO (EXTRACORPOREAL MEMBRANE OXYGENATION) (N/A) TRANSESOPHAGEAL ECHOCARDIOGRAM (TEE) (N/A) Diuresis stable from ECMO standpoint  Agree with re-imaging pulm vasculature to determine next step in PE management   LOS: 3 days    Philip Wolfe 01/26/2019

## 2019-01-26 NOTE — Progress Notes (Signed)
2 Days Post-Op Procedure(s) (LRB): CANNULATION FOR VA ECMO (EXTRACORPOREAL MEMBRANE OXYGENATION) (N/A) TRANSESOPHAGEAL ECHOCARDIOGRAM (TEE) (N/A) Subjective: Patient has demonstrated some improvement in RV function by echo and some improvement in lysis of the left main pulmonary artery thrombus but the right main pulmonary artery remains with hemodynamically significant thrombus.  I met with the patient's wife in the ICU and reviewed his current condition and plan of care.  She understands that he will be transported back to interventional radiology for catheter placement for another  administration of TPA.  The patient's wife understands that patients requiring ECMO for support and treatment of severe pulmonary embolus have only a 50% chance of survival.  Objective: Vital signs in last 24 hours: Temp:  [98.1 F (36.7 C)-100 F (37.8 C)] 98.4 F (36.9 C) (12/10 0755) Pulse Rate:  [80-93] 89 (12/10 0755) Cardiac Rhythm: Normal sinus rhythm (12/10 0400) Resp:  [0-24] 10 (12/10 0755) BP: (115-119)/(72-75) 116/72 (12/10 0432) SpO2:  [99 %-100 %] 100 % (12/10 0755) Arterial Line BP: (90-249)/(68-243) 118/72 (12/10 0700) FiO2 (%):  [40 %] 40 % (12/10 0755) Weight:  [152.2 kg] 152.2 kg (12/10 0344)  Hemodynamic parameters for last 24 hours: CVP:  [1 mmHg-9 mmHg] 7 mmHg  Intake/Output from previous day: 12/09 0701 - 12/10 0700 In: 5434.2 [I.V.:4229.8; Blood:630; IV Piggyback:574.5] Out: 2950 [Urine:2050] Intake/Output this shift: No intake/output data recorded.  Sedated on ventilator Some bleeding from posterior nasopharynx but improved after ENT intervention Scattered rhonchi Sinus rhythm without murmur or gallop Abdomen soft No bleeding around ECMO cannula sites Slight amount of blood around right IJ sheath site   Lab Results: Recent Labs    01/26/19 0200 01/26/19 0555 01/26/19 0836  WBC 36.0*  --  34.5*  HGB 8.0* 5.8* 7.7*  HCT 22.3* 17.0* 22.5*  PLT 70*  --  65*    BMET:  Recent Labs    01/25/19 1703 01/26/19 0200 01/26/19 0356 01/26/19 0555  NA 142 140 142 143  K 3.7 3.3* 2.9* 2.9*  CL 106 107  --   --   CO2 25 25  --   --   GLUCOSE 204* 211*  --   --   BUN 45* 43*  --   --   CREATININE 1.52* 1.37*  --   --   CALCIUM 6.6* 6.4*  --   --     PT/INR:  Recent Labs    01/26/19 0200  LABPROT 18.0*  INR 1.5*   ABG    Component Value Date/Time   PHART 7.461 (H) 01/26/2019 0555   HCO3 21.1 01/26/2019 0555   TCO2 22 01/26/2019 0555   ACIDBASEDEF 3.0 (H) 01/26/2019 0555   O2SAT 100.0 01/26/2019 0555   CBG (last 3)  Recent Labs    01/26/19 0552 01/26/19 0653 01/26/19 0817  GLUCAP 174* 189* 218*    Assessment/Plan: S/P Procedure(s) (LRB): CANNULATION FOR VA ECMO (EXTRACORPOREAL MEMBRANE OXYGENATION) (N/A) TRANSESOPHAGEAL ECHOCARDIOGRAM (TEE) (N/A) Obtain tracheal aspirate for sputum culture Hold on placement of feeding tube due to active nasopharyngeal bleeding Wean Levophed and epinephrine as tolerated to keep mean arterial pressure greater than 80 mm Hg  and circuit flow greater than 4.2 L/min  - -with high-dose sedation  as well as high-dose inhaled nitric oxide the patient will need some amount of continued norepinephrine support.   LOS: 3 days    Tharon Aquas Trigt III 01/26/2019

## 2019-01-26 NOTE — Progress Notes (Signed)
ANTICOAGULATION CONSULT NOTE   Pharmacy Consult for Heparin  Indication: pulmonary embolus + ECMO  No Known Allergies  Patient Measurements: Height: 5' 9.02" (175.3 cm) Weight: (!) 335 lb 8.6 oz (152.2 kg) IBW/kg (Calculated) : 70.74 Heparin Dosing Weight: 103 kg  Vital Signs: Temp: 98.2 F (36.8 C) (12/10 0700) Temp Source: Bladder (12/09 2000) BP: 116/72 (12/10 0432) Pulse Rate: 82 (12/10 0700)  Labs: Recent Labs    01/25/2019 1500 01/25/2019 1657 02/02/2019 2051 01/27/19 0029 Jan 27, 2019 0206 27-Jan-2019 1736 01-27-2019 2039 01/25/19 0200 01/25/19 0500 01/25/19 0611 01/25/19 1315 01/25/19 1703 01/25/19 1834 01/25/19 2205 01/26/19 0200 01/26/19 0356 01/26/19 0555  HGB  --   --   --   --   --  8.5*  --   --    < >  --  9.0* 8.4*  --  8.5* 8.0* 5.8* 5.8*  HCT  --   --   --   --   --  25.5*  --   --    < >  --  25.6* 24.5*  --  24.1* 22.3* 17.0* 17.0*  PLT  --   --    < >  --    < > 152   < >  --    < >  --  73* 74*  --  72* 70*  --   --   APTT  --   --    < > 50*  --  138*   < >  --   --  42* 93*  --  115*  --  104*  --   --   LABPROT  --   --   --  17.6*   < > 22.9*  --   --   --  26.4*  --   --   --   --  18.0*  --   --   INR  --   --   --  1.5*   < > 2.0*  --   --   --  2.4*  --   --   --   --  1.5*  --   --   HEPARINUNFRC  --   --   --   --    < >  --    < >  --   --   --  0.42  --  0.33  --  0.37  --   --   CREATININE  --   --    < >  --    < >  --    < > 1.91*  --   --   --  1.52*  --   --  1.37*  --   --   TROPONINIHS 35* 315*  --  884*  --   --   --   --   --   --   --   --   --   --   --   --   --    < > = values in this interval not displayed.    Estimated Creatinine Clearance: 76.4 mL/min (A) (by C-G formula based on SCr of 1.37 mg/dL (H)).  Assessment: 79 yoM admitted with massive PE and RV failure. Pt received 150mg  total of systemic tPA with continued shock so ECMO started 12/8. EKOS started along with IV heparin and is now complete x24h.  Heparin level  remains therapeutic at 0.37, bleeding relatively stable overnight after nasal packing, Hgb low  now getting pRBCs. Per protocol will begin heparin level checks q12h.  Goal of Therapy:  Heparin level 0.3-0.5 units/ml (while on ECMO) Monitor platelets by anticoagulation protocol: Yes    Plan:  -Continue heparin 1500 units/hr -Heparin level q12h while therapeutic    Fredonia Highland, PharmD, BCPS Clinical Pharmacist [phone] Please check AMION for all [facility] numbers 01/26/2019

## 2019-01-26 NOTE — Progress Notes (Signed)
ANTICOAGULATION CONSULT NOTE   Pharmacy Consult for Heparin  Indication: pulmonary embolus + ECMO  No Known Allergies  Patient Measurements: Height: 5' 9.02" (175.3 cm) Weight: (!) 335 lb 8.6 oz (152.2 kg) IBW/kg (Calculated) : 70.74 Heparin Dosing Weight: 103 kg  Vital Signs: Temp: 97.9 F (36.6 C) (12/10 1415) Temp Source: Core (12/10 1130) BP: 94/64 (12/10 1154) Pulse Rate: 80 (12/10 1815)  Labs: Recent Labs    02/16/19 0029 02-16-19 0206 02-16-19 1736 02-16-2019 1752 01/25/19 0611 01/25/19 1315 01/25/19 1703 01/25/19 1832 01/25/19 1834 01/26/19 0200 01/26/19 0836 01/26/19 1430 01/26/19 1740 01/26/19 1758  HGB  --   --  8.5*  --   --  9.0* 8.4*  --   --  8.0* 7.7* 8.6* 7.7* 6.8*  HCT  --   --  25.5*  --   --  25.6* 24.5*  --   --  22.3* 22.5* 25.2* 22.4* 20.0*  PLT  --    < > 152   < >  --  73* 74*   < >  --  70* 65* 63* 57*  --   APTT 50*  --  138*   < > 42* 93*  --   --  115* 104*  --   --   --   --   LABPROT 17.6*   < > 22.9*  --  26.4*  --   --   --   --  18.0*  --   --   --   --   INR 1.5*   < > 2.0*  --  2.4*  --   --   --   --  1.5*  --   --   --   --   HEPARINUNFRC  --    < >  --    < >  --  0.42  --   --  0.33 0.37  --   --  0.52  --   CREATININE  --    < >  --    < >  --   --  1.52*  --   --  1.37*  --   --  1.29*  --   TROPONINIHS 884*  --   --   --   --   --   --   --   --   --   --   --   --   --    < > = values in this interval not displayed.    Estimated Creatinine Clearance: 81.2 mL/min (A) (by C-G formula based on SCr of 1.29 mg/dL (H)).  Assessment: 70 yoM admitted with massive PE and RV failure. Pt received 150mg  total of systemic tPA with continued shock so ECMO started 12/8. Catheter directed thrombolysis  started along with IV heparin 12/9 pm Completed 12/10 am - remains with large clot burden and patient returned to IR for repeat catheter directed thrombolysis with alteplase running in 2 IVs 1mg  /hr x12hr.    Heparin drip 1500 uts/hr and  Heparin level remains therapeutic at 0.52 - upper end of range.  Patient did not receive additional heparin in IR per RN, per note documentation Hgb low now getting pRBCs, PLTC low 60, LDH elevated but stable 300, fibrinogen 400.    Goal of Therapy:  Heparin level 0.3-0.5 units/ml (while on ECMO) Monitor platelets by anticoagulation protocol: Yes    Plan:  -Continue heparin 1500 units/hr -recheck level at MN to ensure not accumulating and  q6h until stable    Bonnita Nasuti Pharm.D. CPP, BCPS Clinical Pharmacist [phone] 01/26/2019 7:45 PM    Please check AMION for all [place] numbers 01/26/2019

## 2019-01-26 NOTE — Progress Notes (Signed)
RT NOTE: RT transported patient from [place] to CT and back with second RRT, RN, ECMO specialist and transport. No complications. RT will continue to monitor.

## 2019-01-26 NOTE — Progress Notes (Signed)
Dr. Cyndia Bent notified of istat eg7 results. Orders received for calcium gluc. Also notified of K 3.2; patient has received 6 runs of Kcl and has 2 runs remaining - stated to give another 4 runs and recheck bmet after runs are completed.   Also notified of NIRS being 45 on RLE when other 3 extremities are 60-65. Pulses present via doppler. Cap refill remains > 3, which is unchanged from previous assessment. Dr. Cyndia Bent notified; stated to keep checking pulses frequently.   Joellen Jersey, RN

## 2019-01-26 NOTE — Progress Notes (Addendum)
Subjective: Nasal and pharyngeal bleeding have decreased but persists. Oozing is noted from the right nostril.  Objective: Vital signs in last 24 hours: Temp:  [98.1 F (36.7 C)-100 F (37.8 C)] 98.4 F (36.9 C) (12/10 0755) Pulse Rate:  [80-93] 89 (12/10 0755) Resp:  [0-24] 10 (12/10 0755) BP: (115-119)/(72-75) 116/72 (12/10 0432) SpO2:  [99 %-100 %] 100 % (12/10 0755) Arterial Line BP: (90-249)/(68-243) 118/72 (12/10 0700) FiO2 (%):  [40 %] 40 % (12/10 0755) Weight:  [152.2 kg] 152.2 kg (12/10 0344)  Physical exam: General appearance: Morbidly obese and intubated. Sedated. Not responsive. Head: Normocephalic, without obvious abnormality, atraumatic Ears: Examination of the ears shows normal auricles and external auditory canals bilaterally.  Nose: Nasal packing in place. Oozing is noted from the right nostril. Face: Facial examination shows no asymmetry.  Mouth: ET tube in place. A small amount of blood clots in the pharynx. Neck: Palpation of the neck reveals no lymphadenopathy or mass. The trachea is midline. The thyroid is not significantly enlarged.  Neuro: Sedated. Not responsive.  Recent Labs    01/26/19 0200 01/26/19 0555 01/26/19 0836  WBC 36.0*  --  34.5*  HGB 8.0* 5.8* 7.7*  HCT 22.3* 17.0* 22.5*  PLT 70*  --  65*   Recent Labs    01/25/19 1703 01/26/19 0200 01/26/19 0356 01/26/19 0555  NA 142 140 142 143  K 3.7 3.3* 2.9* 2.9*  CL 106 107  --   --   CO2 25 25  --   --   GLUCOSE 204* 211*  --   --   BUN 45* 43*  --   --   CREATININE 1.52* 1.37*  --   --   CALCIUM 6.6* 6.4*  --   --     Medications:  I have reviewed the patient's current medications. Scheduled: . sodium chloride   Intravenous Once  . sodium chloride   Intravenous Once  . acetaminophen  1,000 mg Oral Q6H   Or  . acetaminophen (TYLENOL) oral liquid 160 mg/5 mL  1,000 mg Per Tube Q6H  . artificial tears  1 application Both Eyes N0I  . bisacodyl  10 mg Oral Daily   Or  . bisacodyl   10 mg Rectal Daily  . chlorhexidine gluconate (MEDLINE KIT)  15 mL Mouth Rinse BID  . Chlorhexidine Gluconate Cloth  6 each Topical Daily  . docusate sodium  200 mg Oral Daily  . fentaNYL (SUBLIMAZE) injection  50 mcg Intravenous Once  . furosemide  40 mg Intravenous Once  . mouth rinse  15 mL Mouth Rinse 10 times per day  . metoCLOPramide (REGLAN) injection  10 mg Intravenous Q6H  . pantoprazole (PROTONIX) IV  40 mg Intravenous Q24H  . sodium chloride flush  3 mL Intravenous Q12H  . sodium chloride flush  3 mL Intravenous Q12H   Continuous: . sodium chloride    . sodium chloride    . sodium chloride 20.8 mL/hr at 01/25/19 0840  . sodium chloride 20.8 mL/hr at 01/25/19 0840  . sodium chloride    . sodium chloride    . ceFEPime (MAXIPIME) IV    . cisatracurium (NIMBEX) infusion 3 mcg/kg/min (01/26/19 0857)  . dexmedetomidine (PRECEDEX) IV infusion 1.2 mcg/kg/hr (01/26/19 0832)  . epinephrine 36 mcg/min (01/26/19 0700)  . famotidine (PEPCID) IV Stopped (01/25/19 2138)  . fentaNYL infusion INTRAVENOUS 200 mcg/hr (01/26/19 0826)  . furosemide (LASIX) infusion    . heparin 1,500 Units/hr (01/26/19 0824)  . insulin  5.5 mL/hr at 01/26/19 0700  . lactated ringers 10 mL/hr at 01/25/19 1031  . lactated ringers Stopped (01/26/2019 2159)  . midazolam 10 mg/hr (01/26/19 0832)  . norepinephrine (LEVOPHED) Adult infusion 2 mcg/min (01/26/19 0700)  . potassium chloride    . vasopressin (PITRESSIN) infusion - *FOR SHOCK* 0.03 Units/min (01/26/19 0700)    Procedure: Repeat anterior/posterior nasal packing for control of right epistaxis (With Foley and strip gauze). Anesthesia: None Description: The patient is placed supine in his hospital bed.  Blood clot is suctions from the right nasal cavity. Bleeding is noted from anterior and posterior right nasal cavity. A Foley catheter is inserted via the right nasal cavity into the nasopharynx. The Foley balloon is inflated and pulled forward to obstruct  the posterior nasal cavity. The Foley is sutured in place. The nasal cavity is packed with 1/2 inch strip gauze.   The patient tolerated the procedure well.  Assessment/Plan: Persistent right epistaxis s/p TPA treatment for massive pulmonary emboli. He may need another TPA treatment today. - Repack the right nasal cavity with Foley catheter and gauze. - Contune abx treatment.   LOS: 3 days   Merlin Golden W Margarette Vannatter 01/26/2019, 10:10 AM

## 2019-01-26 NOTE — Progress Notes (Signed)
Patient ID: Philip Wolfe, male   DOB: 1950-12-09, 68 y.o.   MRN: [id number]     Advanced Heart Failure Rounding Note  PCP-Cardiologist: No primary care provider on file.   Subjective:    - Massive PE with cardiogenic shock: [facility] ECMO initiated 12/8. Patient then had PA catheter-directed tPA until 8 am 12/9.   - 12/9 ENT Merocel packing for severe epistaxis.   Nasopharyngeal bleeding has slowed after nasal packing by ENT. He had 1 unit PRBCs this morning, hgb 8 today.   Pressors slowly trending down, now on norepinephrine 2, epinephrine 35, vasopressin 0.03.  MAP 80s.  CVP 13 with good UOP (2058 cc, no diuretic).  Heparin gtt ongoing with therapeutic level. iNO at 20 ppm. Co-ox 78% this morning. LDH 309.   ABG 7.46/30/211.  CXR with bilateral hazy opacities > on left, suspect effusions.  Empiric cefepime ongoing, Tm 100, cultures remain negative.  WBCs 33.    ECMO:  Flow 4.4 L/min  3500 rpm Venous pressure -89 Delta P 21  Objective:   Weight Range: (!) 152.2 kg Body mass index is 49.53 kg/m.   Vital Signs:   Temp:  [98.1 F (36.7 C)-100 F (37.8 C)] 98.2 F (36.8 C) (12/10 0700) Pulse Rate:  [80-93] 82 (12/10 0700) Resp:  [0-24] 10 (12/10 0700) BP: (98-119)/(72-75) 116/72 (12/10 0432) SpO2:  [99 %-100 %] 100 % (12/10 0700) Arterial Line BP: (90-249)/(68-243) 118/72 (12/10 0700) FiO2 (%):  [40 %] 40 % (12/10 0400) Weight:  [152.2 kg] 152.2 kg (12/10 0344) Last BM Date: (PTA)  Weight change: Filed Weights   01/28/2019 1100 01/25/19 0500 01/26/19 0344  Weight: (!) 146.5 kg (!) 148.2 kg (!) 152.2 kg    Intake/Output:   Intake/Output Summary (Last 24 hours) at 01/26/2019 0726 Last data filed at 01/26/2019 0700 Gross per 24 hour  Intake 5434.24 ml  Output 2950 ml  Net 2484.24 ml      Physical Exam    General: Intubated/sedated.   Neck: Thick, JVP difficult with catheters in both IJs, no thyromegaly or thyroid nodule.  Lungs: Decreased at bases. CV: Nondisplaced  PMI.  Heart regular S1/S2, no S3/S4, no murmur.  Diffuse anasarca.   Abdomen: Soft, nontender, no hepatosplenomegaly, mild distention.  Skin: Intact without lesions or rashes.  Neurologic: Sedated Extremities: No clubbing or cyanosis.  HEENT: Normal.    Telemetry   NSR 80s (personally reviewed)  Labs    CBC Recent Labs    01/22/2019 1504 01/21/2019 1505 01/25/19 2205 01/26/19 0200 01/26/19 0356 01/26/19 0555  WBC 15.6*   < > 35.9* 36.0*  --   --   NEUTROABS 10.6*  --   --   --   --   --   HGB 16.6  --  8.5* 8.0* 5.8* 5.8*  HCT 52.0  --  24.1* 22.3* 17.0* 17.0*  MCV 94.2   < > 87.0 86.8  --   --   PLT 242   < > 72* 70*  --   --    < > = values in this interval not displayed.   Basic Metabolic Panel Recent Labs    01/19/2019 0454 01/22/2019 0750 01/25/19 1703 01/26/19 0200 01/26/19 0356 01/26/19 0555  NA 144  --  142 140 142 143  K 3.4*  --  3.7 3.3* 2.9* 2.9*  CL 104   < > 106 107  --   --   CO2 19*   < > 25 25  --   --  GLUCOSE 390*   < > 204* 211*  --   --   BUN 54*   < > 45* 43*  --   --   CREATININE 2.33*   < > 1.52* 1.37*  --   --   CALCIUM 7.6*   < > 6.6* 6.4*  --   --   MG 2.1   < > 1.5* 2.1  --   --   PHOS 9.1*  --   --   --   --   --    < > = values in this interval not displayed.   Liver Function Tests Recent Labs    01/25/19 1703 01/26/19 0200  AST 50* 45*  ALT 33 32  ALKPHOS 34* 35*  BILITOT 1.0 1.1  PROT 3.9* 3.6*  ALBUMIN 2.2* 2.0*   No results for input(s): LIPASE, AMYLASE in the last 72 hours. Cardiac Enzymes No results for input(s): CKTOTAL, CKMB, CKMBINDEX, TROPONINI in the last 72 hours.  BNP: BNP (last 3 results) Recent Labs    02/02/2019 1505  BNP 57.6    ProBNP (last 3 results) No results for input(s): PROBNP in the last 8760 hours.   D-Dimer Recent Labs    01/21/2019 1613  DDIMER >20.00*   Hemoglobin A1C Recent Labs    02/11/2019 0454  HGBA1C 6.3*   Fasting Lipid Panel Recent Labs    01/27/2019 0454  TRIG 224*    Thyroid Function Tests No results for input(s): TSH, T4TOTAL, T3FREE, THYROIDAB in the last 72 hours.  Invalid input(s): FREET3  Other results:   Imaging    DG Chest Port 1 View  Result Date: 01/26/2019 CLINICAL DATA:  Endotracheal tube present. On ECMO. EXAM: PORTABLE CHEST 1 VIEW COMPARISON:  Radiograph yesterday. FINDINGS: Endotracheal tube tip 3.8 cm from the carina. Bilateral internal jugular sheaths in place. ECMO catheter in the right axilla and projecting over the atrial SVC junction. Lower lung volumes from prior exam. Unchanged heart size and mediastinal contours. Developing hazy bibasilar opacities, left greater than right. No pneumothorax. IMPRESSION: 1. Endotracheal tube 3.8 cm from the carina. 2. Bilateral internal jugular sheaths.  ECMO catheters in place. 3. Lower lung volumes since yesterday with developing hazy bibasilar opacities, left greater than right, likely pleural effusions and adjacent atelectasis. Electronically Signed   By: Keith Rake M.D.   On: 01/26/2019 06:04   ECHOCARDIOGRAM LIMITED  Result Date: 01/25/2019   ECHOCARDIOGRAM LIMITED REPORT   Patient Name:   Philip Wolfe Date of Exam: 01/25/2019 Medical Rec #:  [id number]     Height:       69.0 in Accession #:    [id number]    Weight:       326.7 lb Date of Birth:  Feb 05, 1951      BSA:          2.55 m Patient Age:    48 years      BP:           98/75 mmHg Patient Gender: M             HR:           88 bpm. Exam Location:  Inpatient  Procedure: Limited Echo Indications:    Pulmonary embolus 415.19  History:        Patient has prior history of Echocardiogram examinations, most                 recent 02/09/2019.  Sonographer:    Paulita Fujita  RDCS Referring Phys: [place]  1. Global right ventricle has severely reduced systolic function.The right ventricular size is severely enlarged. No increase in right ventricular wall thickness.  2. Left ventricular ejection fraction, by visual  estimation, is 50 to 55%. The left ventricle has low normal function. There is no left ventricular hypertrophy.  3. TR signal is inadequate for assessing pulmonary artery systolic pressure.  4. Right ventricular volume and pressure overload.  5. The left ventricle demonstrates regional wall motion abnormalities.  6. Presence of pericardial fat pad.  7. Trivial pericardial effusion is present.  8. The tricuspid valve is grossly normal. Tricuspid valve regurgitation is trivial.  9. No significant change with 02/12/2019. FINDINGS  Left Ventricle: Left ventricular ejection fraction, by visual estimation, is 50 to 55%. The left ventricle has low normal function. The left ventricle demonstrates regional wall motion abnormalities. There is no left ventricular hypertrophy. The interventricular septum is flattened in systole and diastole, consistent with right ventricular pressure and volume overload. Left ventricular diastolic function could not be evaluated. Right Ventricle: The right ventricular size is severely enlarged. No increase in right ventricular wall thickness. Global RV systolic function is has severely reduced systolic function. Left Atrium: Left atrial size was normal in size. Right Atrium: Right atrial size was normal in size Pericardium: Trivial pericardial effusion is present. Presence of pericardial fat pad. Mitral Valve: The mitral valve is grossly normal. NWV mitral valve regurgitation. Tricuspid Valve: The tricuspid valve is grossly normal. Tricuspid valve regurgitation is trivial. Aortic Valve: The aortic valve is tricuspid. Aortic valve regurgitation is not visualized. The aortic valve is structurally normal, with no evidence of sclerosis or stenosis. Pulmonic Valve: The pulmonic valve was not well visualized. Pulmonic valve regurgitation NWV. Aorta: The aortic root is normal in size and structure. Venous: The inferior vena cava was not well visualized. Shunts: The interatrial septum was not well  visualized.  Eleonore Chiquito MD Electronically signed by Eleonore Chiquito MD Signature Date/Time: 01/25/2019/3:59:24 PM    Final    IR THROMB F/U EVAL ART/VEN FINAL DAY (MS)  Result Date: 01/25/2019 INDICATION: Massive pulmonary embolism requiring 2 doses of systemic tPA, ultimately placed on ECMO, followed by initiation of bilateral pulmonary arterial catheter directed thrombolysis. EXAM: IR THROMB F/U EVAL ART/VEN FINAL DAY COMPARISON:  Initiation of bilateral pulmonary arterial catheter directed thrombolysis-02/14/2019; chest CT-01/22/2019 MEDICATIONS: None ANESTHESIA/SEDATION: None CONTRAST:  None COMPLICATIONS: None immediate. PROCEDURE: Pressure measurements were NOT acquired at the time of initiation of bilateral pulmonary arterial catheter directed thrombolysis as the patient was on ECMO. As such, prolonged conversations were held with the ICU staff regarding continuing bilateral pulmonary arterial directed thrombolysis for a time period greater than the standard 12 hours versus terminating the procedure. Following this prolonged detailed conversation, the decision was made to terminate the procedure. As pressure measurements were not obtained at the initiation of the procedure, postprocedural pressure make sense for also not obtained. All wires and catheters removed from the right jugular access site. One of the two vascular sheaths was left in place for continued durable intravenous access per request from the ICU staff. IMPRESSION: Technically successful completion of 12 hour bilateral catheter directed thrombolysis for massive pulmonary embolism. Electronically Signed   By: Sandi Mariscal M.D.   On: 01/25/2019 15:43     Medications:     Scheduled Medications:  sodium chloride   Intravenous Once   sodium chloride   Intravenous Once   acetaminophen  1,000 mg Oral Q6H  Or   acetaminophen (TYLENOL) oral liquid 160 mg/5 mL  1,000 mg Per Tube Q6H   bisacodyl  10 mg Oral Daily   Or   bisacodyl   10 mg Rectal Daily   chlorhexidine gluconate (MEDLINE KIT)  15 mL Mouth Rinse BID   Chlorhexidine Gluconate Cloth  6 each Topical Daily   docusate sodium  200 mg Oral Daily   furosemide  40 mg Intravenous Once   mouth rinse  15 mL Mouth Rinse 10 times per day   metoCLOPramide (REGLAN) injection  10 mg Intravenous Q6H   pantoprazole (PROTONIX) IV  40 mg Intravenous Q24H   sodium chloride flush  3 mL Intravenous Q12H   sodium chloride flush  3 mL Intravenous Q12H    Infusions:  sodium chloride     sodium chloride     sodium chloride 20.8 mL/hr at 01/25/19 0840   sodium chloride 20.8 mL/hr at 01/25/19 0840   sodium chloride     sodium chloride     ceFEPime (MAXIPIME) IV     dexmedetomidine (PRECEDEX) IV infusion 1.2 mcg/kg/hr (01/26/19 0700)   epinephrine 36 mcg/min (01/26/19 0700)   famotidine (PEPCID) IV Stopped (01/25/19 2138)   fentaNYL infusion INTRAVENOUS 200 mcg/hr (01/26/19 0700)   furosemide (LASIX) infusion     heparin 1,500 Units/hr (01/26/19 0700)   insulin 5.5 mL/hr at 01/26/19 0700   lactated ringers 10 mL/hr at 01/25/19 1031   lactated ringers Stopped (01/29/2019 2159)   midazolam 10 mg/hr (01/26/19 0700)   nitroGLYCERIN     norepinephrine (LEVOPHED) Adult infusion 2 mcg/min (01/26/19 0700)   potassium chloride      sodium bicarbonate (isotonic) infusion in sterile water Stopped (01/25/19 0505)   vasopressin (PITRESSIN) infusion - *FOR SHOCK* 0.03 Units/min (01/26/19 0700)    PRN Medications: sodium chloride, sodium chloride, bisacodyl, calcium chloride, dextrose, fentaNYL, metoprolol tartrate, midazolam, morphine injection, ondansetron (ZOFRAN) IV, oxyCODONE, sodium chloride flush, sodium chloride flush, traMADol   Assessment/Plan   1.  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  VA ECMO initiated 12/8.  Currently good flow (4-5 L/min range) at 3500 rpm.  UOP stable with no diuretics.  He  remains on norepinephrine 2, epinephrine 35, vasopressin 0.03.  Echo from yesterday reviewed, RV remains hypokinetic but seems mildly better compared to initial echo.  MAP 80s with CVP 13. Co-ox 78%. Diffuse anasarca, weight up.  Creatinine trending down, 1.37.  - Continue to gradually titrate down pressor support, think we can stop vasopressin this morning.  - Increase iNO to 30 ppm for RV failure.  - Limited echo today to reassess his RV after local tPA.  - Trend lactate.  - Lasix 40 mg IV x 1 now then start gtt at 8 mg/hr.  2. AKI: Creatinine improving with VA ECMO support, down to 1.37.    3. Acute hypoxic/hypoxemic respiratory failure: FiO2 0.4, improved ABG on ECMO.  4. ID: WBCs elevated with Tm 100.  No PNA on CXR.  PCT not elevated initially.  Blood cultures so far negative.  - Empiric coverage with cefepime.  5. Pulmonary embolus: Massive bilateral PE.  He got 150 mg IV total tPA initially, then catheter directed tPA to both PAs finishing 12/9 at 8 am.  RV with some improvement on echo but still hypokinetic.  Still with significant pressor requirement.   - Repeat CTA chest today for clot burden => if significant, repeat catheter-directed tPA versus surgical thrombectomy.  Will  need to discuss with team after CTA.  - Continue heparin gtt.  6. ENT: ENT bleeding, has had nasal packing by ENT.  7. Acute blood loss anemia: ENT bleeding, transfuse hgb < 8. Hgb 8 this morning.   CRITICAL CARE Performed by: Loralie Champagne  Total critical care time: 45 minutes  Critical care time was exclusive of separately billable procedures and treating other patients.  Critical care was necessary to treat or prevent imminent or life-threatening deterioration.  Critical care was time spent personally by me on the following activities: development of treatment plan with patient and/or surrogate as well as nursing, discussions with consultants, evaluation of patient's response to treatment, examination of  patient, obtaining history from patient or surrogate, ordering and performing treatments and interventions, ordering and review of laboratory studies, ordering and review of radiographic studies, pulse oximetry and re-evaluation of patient's condition.   Length of Stay: 3  Loralie Champagne, MD  01/26/2019, 7:26 AM  Advanced Heart Failure Team Pager [phone] (M-F; 7a - 4p)  Please contact [facility] for night-coverage after hours (4p -7a ) and weekends on [email or web address]

## 2019-01-26 NOTE — Progress Notes (Signed)
Patient transported to IR with RTx2 on vent and NO and battery, bedside RN, ECMO specialist and perfusionist.  Transport and procedure were uneventful.  When returned  To 2H23 power plugged into red outlet and oxygen placed to wall.

## 2019-01-26 NOTE — Progress Notes (Signed)
Transported patient to CT with bedside RN, ECMO specialist, perfusionist and RT; O2 tank and battery checked and full.  Transport was uneventful.  Patient transported back from CT to 2H23, oxygen connected back to wall and power plugged back into red wall outlet.  Patients wife at bedside and updated by myself and Dr. Prescott Gum.

## 2019-01-26 NOTE — Progress Notes (Signed)
  Echocardiogram 2D limited has been performed.  Philip Wolfe 01/26/2019, 8:31 AM

## 2019-01-27 ENCOUNTER — Inpatient Hospital Stay (HOSPITAL_COMMUNITY): Payer: BC Managed Care – PPO

## 2019-01-27 ENCOUNTER — Encounter (HOSPITAL_COMMUNITY): Payer: Self-pay

## 2019-01-27 ENCOUNTER — Encounter (HOSPITAL_COMMUNITY): Payer: Self-pay | Admitting: Certified Registered Nurse Anesthetist

## 2019-01-27 DIAGNOSIS — I5031 Acute diastolic (congestive) heart failure: Secondary | ICD-10-CM

## 2019-01-27 DIAGNOSIS — I2699 Other pulmonary embolism without acute cor pulmonale: Secondary | ICD-10-CM

## 2019-01-27 HISTORY — PX: IR THROMB F/U EVAL ART/VEN FINAL DAY (MS): IMG5379

## 2019-01-27 LAB — TYPE AND SCREEN
ABO/RH(D): A POS
Antibody Screen: NEGATIVE
Unit division: 0
Unit division: 0
Unit division: 0
Unit division: 0
Unit division: 0
Unit division: 0
Unit division: 0
Unit division: 0
Unit division: 0
Unit division: 0
Unit division: 0
Unit division: 0
Unit division: 0
Unit division: 0
Unit division: 0

## 2019-01-27 LAB — POCT I-STAT 7, (LYTES, BLD GAS, ICA,H+H)
Acid-Base Excess: 2 mmol/L (ref 0.0–2.0)
Acid-Base Excess: 4 mmol/L — ABNORMAL HIGH (ref 0.0–2.0)
Acid-Base Excess: 5 mmol/L — ABNORMAL HIGH (ref 0.0–2.0)
Acid-Base Excess: 6 mmol/L — ABNORMAL HIGH (ref 0.0–2.0)
Acid-Base Excess: 6 mmol/L — ABNORMAL HIGH (ref 0.0–2.0)
Acid-Base Excess: 6 mmol/L — ABNORMAL HIGH (ref 0.0–2.0)
Acid-Base Excess: 7 mmol/L — ABNORMAL HIGH (ref 0.0–2.0)
Bicarbonate: 24.4 mmol/L (ref 20.0–28.0)
Bicarbonate: 26.2 mmol/L (ref 20.0–28.0)
Bicarbonate: 27.7 mmol/L (ref 20.0–28.0)
Bicarbonate: 29.2 mmol/L — ABNORMAL HIGH (ref 20.0–28.0)
Bicarbonate: 30 mmol/L — ABNORMAL HIGH (ref 20.0–28.0)
Bicarbonate: 31.2 mmol/L — ABNORMAL HIGH (ref 20.0–28.0)
Bicarbonate: 32.1 mmol/L — ABNORMAL HIGH (ref 20.0–28.0)
Bicarbonate: 32.3 mmol/L — ABNORMAL HIGH (ref 20.0–28.0)
Calcium, Ion: 0.86 mmol/L — CL (ref 1.15–1.40)
Calcium, Ion: 0.88 mmol/L — CL (ref 1.15–1.40)
Calcium, Ion: 0.9 mmol/L — ABNORMAL LOW (ref 1.15–1.40)
Calcium, Ion: 0.92 mmol/L — ABNORMAL LOW (ref 1.15–1.40)
Calcium, Ion: 0.92 mmol/L — ABNORMAL LOW (ref 1.15–1.40)
Calcium, Ion: 0.95 mmol/L — ABNORMAL LOW (ref 1.15–1.40)
Calcium, Ion: 0.99 mmol/L — ABNORMAL LOW (ref 1.15–1.40)
Calcium, Ion: 1.01 mmol/L — ABNORMAL LOW (ref 1.15–1.40)
HCT: 16 % — ABNORMAL LOW (ref 39.0–52.0)
HCT: 18 % — ABNORMAL LOW (ref 39.0–52.0)
HCT: 20 % — ABNORMAL LOW (ref 39.0–52.0)
HCT: 20 % — ABNORMAL LOW (ref 39.0–52.0)
HCT: 20 % — ABNORMAL LOW (ref 39.0–52.0)
HCT: 21 % — ABNORMAL LOW (ref 39.0–52.0)
HCT: 23 % — ABNORMAL LOW (ref 39.0–52.0)
HCT: 24 % — ABNORMAL LOW (ref 39.0–52.0)
Hemoglobin: 5.4 g/dL — CL (ref 13.0–17.0)
Hemoglobin: 6.1 g/dL — CL (ref 13.0–17.0)
Hemoglobin: 6.8 g/dL — CL (ref 13.0–17.0)
Hemoglobin: 6.8 g/dL — CL (ref 13.0–17.0)
Hemoglobin: 6.8 g/dL — CL (ref 13.0–17.0)
Hemoglobin: 7.1 g/dL — ABNORMAL LOW (ref 13.0–17.0)
Hemoglobin: 7.8 g/dL — ABNORMAL LOW (ref 13.0–17.0)
Hemoglobin: 8.2 g/dL — ABNORMAL LOW (ref 13.0–17.0)
O2 Saturation: 100 %
O2 Saturation: 100 %
O2 Saturation: 100 %
O2 Saturation: 100 %
O2 Saturation: 100 %
O2 Saturation: 100 %
O2 Saturation: 100 %
O2 Saturation: 100 %
Patient temperature: 36.5
Patient temperature: 36.5
Patient temperature: 36.5
Patient temperature: 36.6
Patient temperature: 36.6
Patient temperature: 36.6
Patient temperature: 36.7
Patient temperature: 36.7
Potassium: 3.2 mmol/L — ABNORMAL LOW (ref 3.5–5.1)
Potassium: 3.3 mmol/L — ABNORMAL LOW (ref 3.5–5.1)
Potassium: 3.3 mmol/L — ABNORMAL LOW (ref 3.5–5.1)
Potassium: 3.6 mmol/L (ref 3.5–5.1)
Potassium: 4 mmol/L (ref 3.5–5.1)
Potassium: 4 mmol/L (ref 3.5–5.1)
Potassium: 4.1 mmol/L (ref 3.5–5.1)
Potassium: 4.2 mmol/L (ref 3.5–5.1)
Sodium: 138 mmol/L (ref 135–145)
Sodium: 139 mmol/L (ref 135–145)
Sodium: 139 mmol/L (ref 135–145)
Sodium: 139 mmol/L (ref 135–145)
Sodium: 141 mmol/L (ref 135–145)
Sodium: 142 mmol/L (ref 135–145)
Sodium: 142 mmol/L (ref 135–145)
Sodium: 143 mmol/L (ref 135–145)
TCO2: 26 mmol/L (ref 22–32)
TCO2: 27 mmol/L (ref 22–32)
TCO2: 29 mmol/L (ref 22–32)
TCO2: 30 mmol/L (ref 22–32)
TCO2: 31 mmol/L (ref 22–32)
TCO2: 33 mmol/L — ABNORMAL HIGH (ref 22–32)
TCO2: 34 mmol/L — ABNORMAL HIGH (ref 22–32)
TCO2: 34 mmol/L — ABNORMAL HIGH (ref 22–32)
pCO2 arterial: 33.1 mmHg (ref 32.0–48.0)
pCO2 arterial: 37.5 mmHg (ref 32.0–48.0)
pCO2 arterial: 39 mmHg (ref 32.0–48.0)
pCO2 arterial: 39.1 mmHg (ref 32.0–48.0)
pCO2 arterial: 39.5 mmHg (ref 32.0–48.0)
pCO2 arterial: 47.1 mmHg (ref 32.0–48.0)
pCO2 arterial: 50.5 mmHg — ABNORMAL HIGH (ref 32.0–48.0)
pCO2 arterial: 57.6 mmHg — ABNORMAL HIGH (ref 32.0–48.0)
pH, Arterial: 7.355 (ref 7.350–7.450)
pH, Arterial: 7.404 (ref 7.350–7.450)
pH, Arterial: 7.409 (ref 7.350–7.450)
pH, Arterial: 7.427 (ref 7.350–7.450)
pH, Arterial: 7.433 (ref 7.350–7.450)
pH, Arterial: 7.487 — ABNORMAL HIGH (ref 7.350–7.450)
pH, Arterial: 7.498 — ABNORMAL HIGH (ref 7.350–7.450)
pH, Arterial: 7.528 — ABNORMAL HIGH (ref 7.350–7.450)
pO2, Arterial: 260 mmHg — ABNORMAL HIGH (ref 83.0–108.0)
pO2, Arterial: 313 mmHg — ABNORMAL HIGH (ref 83.0–108.0)
pO2, Arterial: 330 mmHg — ABNORMAL HIGH (ref 83.0–108.0)
pO2, Arterial: 337 mmHg — ABNORMAL HIGH (ref 83.0–108.0)
pO2, Arterial: 344 mmHg — ABNORMAL HIGH (ref 83.0–108.0)
pO2, Arterial: 387 mmHg — ABNORMAL HIGH (ref 83.0–108.0)
pO2, Arterial: 406 mmHg — ABNORMAL HIGH (ref 83.0–108.0)
pO2, Arterial: 416 mmHg — ABNORMAL HIGH (ref 83.0–108.0)

## 2019-01-27 LAB — CBC
HCT: 25 % — ABNORMAL LOW (ref 39.0–52.0)
HCT: 23.4 % — ABNORMAL LOW (ref 39.0–52.0)
HCT: 20.6 % — ABNORMAL LOW (ref 39.0–52.0)
HCT: 21.3 % — ABNORMAL LOW (ref 39.0–52.0)
HCT: 19.8 % — ABNORMAL LOW (ref 39.0–52.0)
HCT: 20.2 % — ABNORMAL LOW (ref 39.0–52.0)
Hemoglobin: 8.6 g/dL — ABNORMAL LOW (ref 13.0–17.0)
Hemoglobin: 8 g/dL — ABNORMAL LOW (ref 13.0–17.0)
Hemoglobin: 7.3 g/dL — ABNORMAL LOW (ref 13.0–17.0)
Hemoglobin: 7.5 g/dL — ABNORMAL LOW (ref 13.0–17.0)
Hemoglobin: 7.1 g/dL — ABNORMAL LOW (ref 13.0–17.0)
Hemoglobin: 7 g/dL — ABNORMAL LOW (ref 13.0–17.0)
MCH: 31 pg (ref 26.0–34.0)
MCH: 31.3 pg (ref 26.0–34.0)
MCH: 32.3 pg (ref 26.0–34.0)
MCH: 32.1 pg (ref 26.0–34.0)
MCH: 32.3 pg (ref 26.0–34.0)
MCH: 31.3 pg (ref 26.0–34.0)
MCHC: 34.4 g/dL (ref 30.0–36.0)
MCHC: 34.2 g/dL (ref 30.0–36.0)
MCHC: 35.4 g/dL (ref 30.0–36.0)
MCHC: 35.2 g/dL (ref 30.0–36.0)
MCHC: 35.9 g/dL (ref 30.0–36.0)
MCHC: 34.7 g/dL (ref 30.0–36.0)
MCV: 90.3 fL (ref 80.0–100.0)
MCV: 91.4 fL (ref 80.0–100.0)
MCV: 91.2 fL (ref 80.0–100.0)
MCV: 91 fL (ref 80.0–100.0)
MCV: 90 fL (ref 80.0–100.0)
MCV: 90.2 fL (ref 80.0–100.0)
Platelets: 52 10*3/uL — ABNORMAL LOW (ref 150–400)
Platelets: 54 10*3/uL — ABNORMAL LOW (ref 150–400)
Platelets: 53 10*3/uL — ABNORMAL LOW (ref 150–400)
Platelets: 48 10*3/uL — ABNORMAL LOW (ref 150–400)
Platelets: 50 10*3/uL — ABNORMAL LOW (ref 150–400)
Platelets: 72 10*3/uL — ABNORMAL LOW (ref 150–400)
RBC: 2.77 MIL/uL — ABNORMAL LOW (ref 4.22–5.81)
RBC: 2.56 MIL/uL — ABNORMAL LOW (ref 4.22–5.81)
RBC: 2.26 MIL/uL — ABNORMAL LOW (ref 4.22–5.81)
RBC: 2.34 MIL/uL — ABNORMAL LOW (ref 4.22–5.81)
RBC: 2.2 MIL/uL — ABNORMAL LOW (ref 4.22–5.81)
RBC: 2.24 MIL/uL — ABNORMAL LOW (ref 4.22–5.81)
RDW: 14 % (ref 11.5–15.5)
RDW: 14 % (ref 11.5–15.5)
RDW: 14.2 % (ref 11.5–15.5)
RDW: 13.8 % (ref 11.5–15.5)
RDW: 13.8 % (ref 11.5–15.5)
RDW: 13.8 % (ref 11.5–15.5)
WBC: 34 10*3/uL — ABNORMAL HIGH (ref 4.0–10.5)
WBC: 39 10*3/uL — ABNORMAL HIGH (ref 4.0–10.5)
WBC: 39.4 10*3/uL — ABNORMAL HIGH (ref 4.0–10.5)
WBC: 39.3 10*3/uL — ABNORMAL HIGH (ref 4.0–10.5)
WBC: 40 10*3/uL — ABNORMAL HIGH (ref 4.0–10.5)
WBC: 36.2 10*3/uL — ABNORMAL HIGH (ref 4.0–10.5)
nRBC: 3 % — ABNORMAL HIGH (ref 0.0–0.2)
nRBC: 4.6 % — ABNORMAL HIGH (ref 0.0–0.2)
nRBC: 6.1 % — ABNORMAL HIGH (ref 0.0–0.2)
nRBC: 9.1 % — ABNORMAL HIGH (ref 0.0–0.2)
nRBC: 9.9 % — ABNORMAL HIGH (ref 0.0–0.2)
nRBC: 9 % — ABNORMAL HIGH (ref 0.0–0.2)

## 2019-01-27 LAB — BPAM RBC
Blood Product Expiration Date: 202012282359
Blood Product Expiration Date: 202012282359
Blood Product Expiration Date: 202012282359
Blood Product Expiration Date: 202012282359
Blood Product Expiration Date: 202012282359
Blood Product Expiration Date: 202012282359
Blood Product Expiration Date: 202101082359
Blood Product Expiration Date: 202101082359
Blood Product Expiration Date: 202101122359
Blood Product Expiration Date: 202101132359
Blood Product Expiration Date: 202101132359
Blood Product Expiration Date: 202101132359
Blood Product Expiration Date: 202101132359
Blood Product Expiration Date: 202101132359
Blood Product Expiration Date: 202101142359
ISSUE DATE / TIME: 202012081538
ISSUE DATE / TIME: 202012081538
ISSUE DATE / TIME: 202012082325
ISSUE DATE / TIME: 202012090316
ISSUE DATE / TIME: 202012090509
ISSUE DATE / TIME: 202012090509
ISSUE DATE / TIME: 202012091830
ISSUE DATE / TIME: 202012100423
ISSUE DATE / TIME: 202012101125
ISSUE DATE / TIME: 202012101406
ISSUE DATE / TIME: 202012101406
ISSUE DATE / TIME: 202012102000
Unit Type and Rh: 6200
Unit Type and Rh: 6200
Unit Type and Rh: 6200
Unit Type and Rh: 6200
Unit Type and Rh: 6200
Unit Type and Rh: 6200
Unit Type and Rh: 6200
Unit Type and Rh: 6200
Unit Type and Rh: 6200
Unit Type and Rh: 6200
Unit Type and Rh: 6200
Unit Type and Rh: 6200
Unit Type and Rh: 6200
Unit Type and Rh: 6200
Unit Type and Rh: 6200

## 2019-01-27 LAB — BASIC METABOLIC PANEL
Anion gap: 7 (ref 5–15)
BUN: 38 mg/dL — ABNORMAL HIGH (ref 8–23)
CO2: 26 mmol/L (ref 22–32)
Calcium: 6 mg/dL — CL (ref 8.9–10.3)
Chloride: 107 mmol/L (ref 98–111)
Creatinine, Ser: 1.27 mg/dL — ABNORMAL HIGH (ref 0.61–1.24)
GFR calc Af Amer: >60 mL/min (ref >60)
GFR calc non Af Amer: 58 mL/min — ABNORMAL LOW (ref >60)
Glucose, Bld: 186 mg/dL — ABNORMAL HIGH (ref 70–99)
Potassium: 3.8 mmol/L (ref 3.5–5.1)
Sodium: 140 mmol/L (ref 135–145)

## 2019-01-27 LAB — COMPREHENSIVE METABOLIC PANEL
ALT: 29 U/L (ref 0–44)
ALT: 26 U/L (ref 0–44)
AST: 52 U/L — ABNORMAL HIGH (ref 15–41)
AST: 42 U/L — ABNORMAL HIGH (ref 15–41)
Albumin: 1.8 g/dL — ABNORMAL LOW (ref 3.5–5.0)
Albumin: 1.8 g/dL — ABNORMAL LOW (ref 3.5–5.0)
Alkaline Phosphatase: 48 U/L (ref 38–126)
Alkaline Phosphatase: 46 U/L (ref 38–126)
Anion gap: 9 (ref 5–15)
Anion gap: 9 (ref 5–15)
BUN: 39 mg/dL — ABNORMAL HIGH (ref 8–23)
BUN: 45 mg/dL — ABNORMAL HIGH (ref 8–23)
CO2: 29 mmol/L (ref 22–32)
CO2: 27 mmol/L (ref 22–32)
Calcium: 6.2 mg/dL — CL (ref 8.9–10.3)
Calcium: 6.4 mg/dL — CL (ref 8.9–10.3)
Chloride: 102 mmol/L (ref 98–111)
Chloride: 105 mmol/L (ref 98–111)
Creatinine, Ser: 1.27 mg/dL — ABNORMAL HIGH (ref 0.61–1.24)
Creatinine, Ser: 1.44 mg/dL — ABNORMAL HIGH (ref 0.61–1.24)
GFR calc Af Amer: >60 mL/min (ref >60)
GFR calc Af Amer: 58 mL/min — ABNORMAL LOW (ref >60)
GFR calc non Af Amer: 58 mL/min — ABNORMAL LOW (ref >60)
GFR calc non Af Amer: 50 mL/min — ABNORMAL LOW (ref >60)
Glucose, Bld: 202 mg/dL — ABNORMAL HIGH (ref 70–99)
Glucose, Bld: 164 mg/dL — ABNORMAL HIGH (ref 70–99)
Potassium: 3.8 mmol/L (ref 3.5–5.1)
Potassium: 4 mmol/L (ref 3.5–5.1)
Sodium: 140 mmol/L (ref 135–145)
Sodium: 141 mmol/L (ref 135–145)
Total Bilirubin: 0.9 mg/dL (ref 0.3–1.2)
Total Bilirubin: 1 mg/dL (ref 0.3–1.2)
Total Protein: 3.7 g/dL — ABNORMAL LOW (ref 6.5–8.1)
Total Protein: 3.6 g/dL — ABNORMAL LOW (ref 6.5–8.1)

## 2019-01-27 LAB — FIBRINOGEN
Fibrinogen: 431 mg/dL (ref 210–475)
Fibrinogen: 418 mg/dL (ref 210–475)
Fibrinogen: 396 mg/dL (ref 210–475)

## 2019-01-27 LAB — PROTIME-INR
INR: 1.3 — ABNORMAL HIGH (ref 0.8–1.2)
Prothrombin Time: 16.5 seconds — ABNORMAL HIGH (ref 11.4–15.2)

## 2019-01-27 LAB — GLUCOSE, CAPILLARY
Glucose-Capillary: 150 mg/dL — ABNORMAL HIGH (ref 70–99)
Glucose-Capillary: 153 mg/dL — ABNORMAL HIGH (ref 70–99)
Glucose-Capillary: 153 mg/dL — ABNORMAL HIGH (ref 70–99)
Glucose-Capillary: 162 mg/dL — ABNORMAL HIGH (ref 70–99)
Glucose-Capillary: 163 mg/dL — ABNORMAL HIGH (ref 70–99)
Glucose-Capillary: 167 mg/dL — ABNORMAL HIGH (ref 70–99)
Glucose-Capillary: 171 mg/dL — ABNORMAL HIGH (ref 70–99)
Glucose-Capillary: 171 mg/dL — ABNORMAL HIGH (ref 70–99)
Glucose-Capillary: 172 mg/dL — ABNORMAL HIGH (ref 70–99)
Glucose-Capillary: 179 mg/dL — ABNORMAL HIGH (ref 70–99)
Glucose-Capillary: 182 mg/dL — ABNORMAL HIGH (ref 70–99)
Glucose-Capillary: 183 mg/dL — ABNORMAL HIGH (ref 70–99)
Glucose-Capillary: 186 mg/dL — ABNORMAL HIGH (ref 70–99)
Glucose-Capillary: 189 mg/dL — ABNORMAL HIGH (ref 70–99)
Glucose-Capillary: 190 mg/dL — ABNORMAL HIGH (ref 70–99)
Glucose-Capillary: 194 mg/dL — ABNORMAL HIGH (ref 70–99)
Glucose-Capillary: 197 mg/dL — ABNORMAL HIGH (ref 70–99)

## 2019-01-27 LAB — APTT
aPTT: 130 seconds — ABNORMAL HIGH (ref 24–36)
aPTT: 116 seconds — ABNORMAL HIGH (ref 24–36)

## 2019-01-27 LAB — COOXEMETRY PANEL
Carboxyhemoglobin: 2.2 % — ABNORMAL HIGH (ref 0.5–1.5)
Methemoglobin: 1.7 % — ABNORMAL HIGH (ref 0.0–1.5)
O2 Saturation: 82.4 %
Total hemoglobin: 6.7 g/dL — CL (ref 12.0–16.0)

## 2019-01-27 LAB — PHOSPHORUS
Phosphorus: 3.2 mg/dL (ref 2.5–4.6)
Phosphorus: 3.3 mg/dL (ref 2.5–4.6)

## 2019-01-27 LAB — PREPARE RBC (CROSSMATCH)

## 2019-01-27 LAB — ECHOCARDIOGRAM LIMITED
Height: 69.016 in
Weight: 5298.09 oz

## 2019-01-27 LAB — LACTATE DEHYDROGENASE
LDH: 369 U/L — ABNORMAL HIGH (ref 98–192)
LDH: 391 U/L — ABNORMAL HIGH (ref 98–192)

## 2019-01-27 LAB — HEPARIN LEVEL (UNFRACTIONATED)
Heparin Unfractionated: 0.43 IU/mL (ref 0.30–0.70)
Heparin Unfractionated: 0.42 IU/mL (ref 0.30–0.70)
Heparin Unfractionated: 0.45 IU/mL (ref 0.30–0.70)
Heparin Unfractionated: 0.42 IU/mL (ref 0.30–0.70)

## 2019-01-27 LAB — MAGNESIUM
Magnesium: 1.8 mg/dL (ref 1.7–2.4)
Magnesium: 2.2 mg/dL (ref 1.7–2.4)

## 2019-01-27 LAB — LACTIC ACID, PLASMA: Lactic Acid, Venous: 1.5 mmol/L (ref 0.5–1.9)

## 2019-01-27 MED ORDER — SODIUM CHLORIDE 0.9% IV SOLUTION: Freq: Once | INTRAVENOUS | Status: DC

## 2019-01-27 MED ORDER — ALBUMIN HUMAN 5 % IV SOLN
12.5000 g | Freq: Once | INTRAVENOUS | Status: AC
  Administered 2019-01-27: 12.5 g via INTRAVENOUS

## 2019-01-27 MED ORDER — CALCIUM GLUCONATE-NACL 1-0.675 GM/50ML-% IV SOLN
1.0000 g | Freq: Once | INTRAVENOUS | Status: AC
  Administered 2019-01-27: 1000 mg via INTRAVENOUS
  Filled 2019-01-27: qty 50

## 2019-01-27 MED ORDER — SODIUM CHLORIDE 0.9% FLUSH
10.0000 mL | Freq: Two times a day (BID) | INTRAVENOUS | Status: DC
  Administered 2019-01-27 – 2019-01-30 (×4): 10 mL

## 2019-01-27 MED ORDER — VITAL 1.5 CAL PO LIQD
1000.0000 mL | ORAL | Status: DC
  Administered 2019-01-27: 1000 mL
  Filled 2019-01-27 (×2): qty 1000

## 2019-01-27 MED ORDER — SODIUM CHLORIDE 0.9% FLUSH: 10.0000 mL | INTRAVENOUS | Status: DC | PRN

## 2019-01-27 MED ORDER — POTASSIUM CHLORIDE 10 MEQ/50ML IV SOLN
10.0000 meq | INTRAVENOUS | Status: AC
  Administered 2019-01-27 (×6): 10 meq via INTRAVENOUS
  Filled 2019-01-27: qty 50

## 2019-01-27 MED ORDER — PERFLUTREN LIPID MICROSPHERE
1.0000 mL | INTRAVENOUS | Status: AC | PRN
  Administered 2019-01-27: 7 mL via INTRAVENOUS
  Filled 2019-01-27: qty 10

## 2019-01-27 MED ORDER — MIDAZOLAM 50MG/50ML (1MG/ML) PREMIX INFUSION: 2.0000 mg/h | INTRAVENOUS | Status: DC

## 2019-01-27 MED ORDER — SODIUM CHLORIDE 0.9% IV SOLUTION
Freq: Once | INTRAVENOUS | Status: AC
  Administered 2019-01-27: 22:00:00 via INTRAVENOUS

## 2019-01-27 MED ORDER — SODIUM CHLORIDE 0.9% IV SOLUTION
Freq: Once | INTRAVENOUS | Status: AC
  Administered 2019-01-27: 10:00:00 via INTRAVENOUS

## 2019-01-27 MED ORDER — SODIUM CHLORIDE 0.9 % IV SOLN
2.0000 mg/h | INTRAVENOUS | Status: DC
  Administered 2019-01-27 – 2019-01-28 (×5): 10 mg/h via INTRAVENOUS
  Administered 2019-01-28: 8 mg/h via INTRAVENOUS
  Filled 2019-01-27 (×13): qty 10

## 2019-01-27 MED ORDER — CHLORHEXIDINE GLUCONATE CLOTH 2 % EX PADS
6.0000 | MEDICATED_PAD | Freq: Every day | CUTANEOUS | Status: DC
  Administered 2019-01-28 – 2019-02-02 (×7): 6 via TOPICAL

## 2019-01-27 MED ORDER — VANCOMYCIN HCL IN DEXTROSE 1-5 GM/200ML-% IV SOLN
1000.0000 mg | Freq: Two times a day (BID) | INTRAVENOUS | Status: DC
  Administered 2019-01-27 – 2019-01-28 (×4): 1000 mg via INTRAVENOUS
  Filled 2019-01-27 (×4): qty 200

## 2019-01-27 MED ORDER — MAGNESIUM SULFATE 2 GM/50ML IV SOLN
2.0000 g | Freq: Once | INTRAVENOUS | Status: AC
  Administered 2019-01-27: 2 g via INTRAVENOUS
  Filled 2019-01-27: qty 50

## 2019-01-27 MED ORDER — MIDAZOLAM 50MG/50ML (1MG/ML) PREMIX INFUSION
2.0000 mg/h | INTRAVENOUS | Status: DC
  Filled 2019-01-27: qty 50

## 2019-01-27 MED ORDER — TRACE MINERALS CU-MN-SE-ZN 300-55-60-3000 MCG/ML IV SOLN
INTRAVENOUS | Status: DC
  Filled 2019-01-27: qty 582.4

## 2019-01-27 MED ORDER — SODIUM CHLORIDE 0.9% IV SOLUTION
Freq: Once | INTRAVENOUS | Status: AC
  Administered 2019-01-27: 17:00:00 via INTRAVENOUS

## 2019-01-27 MED ORDER — PRO-STAT SUGAR FREE PO LIQD
30.0000 mL | Freq: Three times a day (TID) | ORAL | Status: DC
  Administered 2019-01-27 – 2019-02-03 (×17): 30 mL
  Filled 2019-01-27 (×16): qty 30

## 2019-01-27 MED ORDER — VITAL HIGH PROTEIN PO LIQD
1000.0000 mL | ORAL | Status: DC
  Administered 2019-01-27: 1000 mL

## 2019-01-27 NOTE — Progress Notes (Addendum)
Subjective: No more bleeding. Pt is unresponsive. On ECMO.  Objective: Vital signs in last 24 hours: Temp:  [97.7 F (36.5 C)-98.1 F (36.7 C)] 98.1 F (36.7 C) (12/11 1544) Pulse Rate:  [70-90] 90 (12/11 1544) Resp:  [0-20] 20 (12/11 1544) BP: (91-95)/(65-69) 95/69 (12/11 1155) SpO2:  [90 %-100 %] 100 % (12/11 1544) Arterial Line BP: (75-110)/(60-75) 86/69 (12/11 1530) FiO2 (%):  [40 %] 40 % (12/11 1544) Weight:  [150.2 kg] 150.2 kg (12/11 0600)  Physical exam: General appearance:Morbidly obese andintubated. Sedated. Not responsive. Head:Normocephalic, without obvious abnormality, atraumatic Ears: Examination of the ears shows normal auricles and external auditory canals bilaterally.  Nose: Nasal packing in place bilaterally. No more bleeding. Face: Facial examination shows no asymmetry.  Mouth:ET tube in place. No more pharyngeal bleeding. Neck:Palpation of the neck reveals no lymphadenopathy or mass. The trachea is midline. The thyroid is not significantly enlarged.  Neuro:Sedated. Not responsive.  Recent Labs    01/27/19 0845 01/27/19 1203 01/27/19 1500  WBC 39.4*  --  39.3*  HGB 7.3* 6.8* 7.5*  HCT 20.6* 20.0* 21.3*  PLT 53*  --  48*   Recent Labs    01/27/19 0222 01/27/19 0446 01/27/19 1103 01/27/19 1203  NA 140 140 139 139  K 3.8 3.8 4.1 4.2  CL 107 102  --   --   CO2 26 29  --   --   GLUCOSE 186* 202*  --   --   BUN 38* 39*  --   --   CREATININE 1.27* 1.27*  --   --   CALCIUM 6.0* 6.2*  --   --     Medications:  I have reviewed the patient's current medications. Scheduled: . sodium chloride   Intravenous Once  . acetaminophen  1,000 mg Oral Q6H   Or  . acetaminophen (TYLENOL) oral liquid 160 mg/5 mL  1,000 mg Per Tube Q6H  . artificial tears  1 application Both Eyes E7M  . bisacodyl  10 mg Oral Daily   Or  . bisacodyl  10 mg Rectal Daily  . chlorhexidine gluconate (MEDLINE KIT)  15 mL Mouth Rinse BID  . [START ON 01/28/2019] Chlorhexidine  Gluconate Cloth  6 each Topical Daily  . docusate sodium  200 mg Oral Daily  . feeding supplement (PRO-STAT SUGAR FREE 64)  30 mL Per Tube TID  . feeding supplement (VITAL HIGH PROTEIN)  1,000 mL Per Tube Q24H  . mouth rinse  15 mL Mouth Rinse 10 times per day  . metoCLOPramide (REGLAN) injection  10 mg Intravenous Q6H  . sodium chloride flush  10-40 mL Intracatheter Q12H  . sodium chloride flush  3 mL Intravenous Q12H   Continuous: . ceFEPime (MAXIPIME) IV Stopped (01/27/19 1046)  . cisatracurium (NIMBEX) infusion Stopped (01/27/19 1057)  . dexmedetomidine (PRECEDEX) IV infusion Stopped (01/26/19 1143)  . epinephrine 23 mcg/min (01/27/19 1600)  . famotidine (PEPCID) IV Stopped (01/27/19 1258)  . feeding supplement (VITAL 1.5 CAL)    . fentaNYL infusion INTRAVENOUS 300 mcg/hr (01/27/19 1600)  . furosemide (LASIX) infusion 8 mg/hr (01/27/19 1600)  . heparin 1,500 Units/hr (01/27/19 1600)  . insulin 6 mL/hr at 01/27/19 1600  . lactated ringers 10 mL/hr at 01/25/19 1031  . lactated ringers Stopped (02/10/2019 2159)  . midazolam 10 mg/hr (01/27/19 1600)  . norepinephrine (LEVOPHED) Adult infusion 24 mcg/min (01/27/19 1600)  . TPN ADULT (ION)    . vancomycin Stopped (01/27/19 1015)  . vasopressin (PITRESSIN) infusion - *FOR SHOCK*  0.04 Units/min (01/27/19 1600)    Assessment/Plan: Severe bilateral epistaxis s/p TPA treatment for massive pulmonary emboli. Bleeding is finally under control. - Will leave packing in place. - Continue abx treatment.   LOS: 4 days   Tannon Peerson W Darrel Gloss 01/27/2019, 3:53 PM

## 2019-01-27 NOTE — Progress Notes (Signed)
ANTICOAGULATION CONSULT NOTE   Pharmacy Consult for Heparin  Indication: pulmonary embolus + ECMO  No Known Allergies  Patient Measurements: Height: 5' 9.02" (175.3 cm) Weight: (!) 335 lb 8.6 oz (152.2 kg) IBW/kg (Calculated) : 70.74 Heparin Dosing Weight: 103 kg  Vital Signs: Temp: 97.7 F (36.5 C) (12/10 2100) Temp Source: Bladder (12/10 2010) Pulse Rate: 82 (12/10 2330)  Labs: Recent Labs    02/15/2019 1736 01/27/2019 1752 01/25/19 0611 01/25/19 0811 01/25/19 1703 01/25/19 1832 01/25/19 1834 01/26/19 0200 01/26/19 0356 01/26/19 1430 01/26/19 1740 01/26/19 2215 01/26/19 2316 01/26/19 2334 01/27/19 0013  HGB 8.5*  --   --   --  8.4*  --   --  8.0*  --  8.6* 7.7* 8.6*  --  8.2* 7.1*  HCT 25.5*  --   --   --  24.5*  --   --  22.3*  --  25.2* 22.4* 25.0*  --  24.0* 21.0*  PLT 152   < >  --   --  74*   < >  --  70*   < > 63* 57* 52*  --   --   --   APTT 138*   < > 42*   < >  --   --  115* 104*  --   --  111*  --   --   --   --   LABPROT 22.9*  --  26.4*  --   --   --   --  18.0*  --   --   --   --   --   --   --   INR 2.0*  --  2.4*  --   --   --   --  1.5*  --   --   --   --   --   --   --   HEPARINUNFRC  --    < >  --    < >  --   --  0.33 0.37  --   --  0.52  --  0.43  --   --   CREATININE  --    < >  --   --  1.52*  --   --  1.37*  --   --  1.29*  --   --   --   --    < > = values in this interval not displayed.    Estimated Creatinine Clearance: 81.2 mL/min (A) (by C-G formula based on SCr of 1.29 mg/dL (H)).  Assessment: 55 yoM admitted with massive PE and RV failure. Pt received 150mg  total of systemic tPA with continued shock so ECMO started 12/8. Catheter directed thrombolysis  started along with IV heparin 12/9 pm Completed 12/10 am - remains with large clot burden and patient returned to IR for repeat catheter directed thrombolysis with alteplase running in 2 IVs 1mg  /hr x12hr.    Heparin level remains therapeutic at 0.43 on 1500 units/hr, s/p pRBC and no  significant changes in bleeding per RN.    Goal of Therapy:  Heparin level 0.3-0.5 units/ml (while on ECMO) Monitor platelets by anticoagulation protocol: Yes    Plan:  Continue heparin gtt at 1500 units/hr Next heparin level in 6 hours  Bertis Ruddy, PharmD Clinical Pharmacist Please check AMION for all [facility] numbers 01/27/2019 12:38 AM

## 2019-01-27 NOTE — Progress Notes (Addendum)
ANTICOAGULATION CONSULT NOTE   Pharmacy Consult for Heparin  Indication: pulmonary embolus + ECMO  No Known Allergies  Patient Measurements: Height: 5' 9.02" (175.3 cm) Weight: (!) 335 lb 8.6 oz (152.2 kg) IBW/kg (Calculated) : 70.74 Heparin Dosing Weight: 103 kg  Vital Signs: Temp: 98.1 F (36.7 C) (12/11 0600) Temp Source: Bladder (12/10 2010) Pulse Rate: 78 (12/11 0305)  Labs: Recent Labs    01/25/19 0611 01/25/19 0811 01/26/19 0200 01/26/19 0356 01/26/19 1740 01/26/19 2215 01/26/19 2316 01/27/19 0200 01/27/19 0222 01/27/19 0446 01/27/19 0621  HGB  --   --  8.0*  --  7.7* 8.6*  --  7.8*  --  8.0* 5.4*  HCT  --   --  22.3*  --  22.4* 25.0*  --  23.0*  --  23.4* 16.0*  PLT  --   --  70*   < > 57* 52*  --   --   --  54*  --   APTT 42*   < > 104*  --  111*  --   --   --   --  130*  --   LABPROT 26.4*  --  18.0*  --   --   --   --   --   --  16.5*  --   INR 2.4*  --  1.5*  --   --   --   --   --   --  1.3*  --   HEPARINUNFRC  --    < > 0.37  --  0.52  --  0.43  --   --  0.42  --   CREATININE  --    < > 1.37*  --  1.29*  --   --   --  1.27* 1.27*  --    < > = values in this interval not displayed.    Estimated Creatinine Clearance: 82.5 mL/min (A) (by C-G formula based on SCr of 1.27 mg/dL (H)).  Assessment: 2 yoM admitted with massive PE and RV failure. Pt received 150mg  total of systemic tPA with continued shock so ECMO started 12/8. EKOS started along with IV heparin, then repeated 12/10 with significant clot burden.  Heparin level remains therapeutic at 0.42. INR normalized, aPTT elevated likely due to alteplase. CBC relatively stable s/p pRBCs this morning.   Goal of Therapy:  Heparin level 0.3-0.5 units/ml (while on ECMO) Monitor platelets by anticoagulation protocol: Yes    Plan:  -Continue heparin 1500 units/hr -Check heparin level q6h today s/p tPA, if remains at goal can check q12h (0500/1700) starting 12/11   ADDENDUM: Repeat heparin level remains  therapeutic, continue heparin 1500 units/h. Will recheck heparin level with evening labs at 1700 and if remains therapeutic will begin q12h level interval as lysis will have been complete for >12h.   Arrie Senate, PharmD, BCPS Clinical Pharmacist [phone] Please check AMION for all [facility] numbers 01/27/2019

## 2019-01-27 NOTE — Progress Notes (Signed)
Venous duplex lower ext  has been completed. Refer to [facility] under chart review to view preliminary results.   01/27/2019  2:31 PM Shadeed Colberg, Bonnye Fava

## 2019-01-27 NOTE — Progress Notes (Signed)
PHARMACY - TOTAL PARENTERAL NUTRITION CONSULT NOTE  Indication:  No enteral access  Patient Measurements: Height: 5' 9.02" (175.3 cm) Weight: (!) 331 lb 2.1 oz (150.2 kg) IBW/kg (Calculated) : 70.74 TPN AdjBW (KG): 89.7 Body mass index is 48.88 kg/m.  Assessment:  54 YOM presented on 01/27/2019 with SOB, decompensated in the ED and required alteplase for high suspicion of PE.  Massive PE confirmed and patient also has respiratory failure and RV failure/cardiogenic shock.  He was placed on ECMO on 01/31/2019.  Unable to place feeding tube and patient is at high risk for acute malnutrition on ECMO; therefore, Pharmacy consulted to manage TPN.  Glucose / Insulin: A1c 6.3% - CBGs elevated on insulin gtt at 6 ml/hr Electrolytes: K 3.2 (6 runs ordered), low iCa, Mag 1.8 (2gm ordered), others WNL Renal: AKI resolving - SCr down 1.27, BUN 39 LFTs / TGs: LFTs WNL except AST at 52, tbili 1.9, TG 224 (ILE 20% of kCal) Prealbumin / albumin: albumin 1.8 Intake / Output; MIVF: UOP 0.2 ml/kg/hr on Lasix gtt, net +9.9L GI Imaging: N/A Surgeries / Procedures: N/A  Central access: 01/26/19 TPN start date: 01/27/19  Nutritional Goals (per RD rec on 12/11): 2100 kCal and 155-185gm protein per day  Current Nutrition:  NPO  Plan:  Start concentrated TPN at 40 mL/hr at 1800 (goal rate 80 ml/hr).  Will initiate lipid given high risk for malnutrition and would help with reducing CHO in TPN. TPN will provide 87g AA, 144g CHO and 21g ILE for a total of 1050 kCal, meeting ~50% of patient needs. Electrolytes in TPN: standard lytes except increased Ca and Mag, Cl:Ac 1:1 Daily trace elements in TPN.  Multivitamin MWF only d/t shortage. Continue insulin infusion per MD - RN aware to ?recalibrate when TPN starts tonight Keep Pepcid 20mg  IV BID instead of adding to TPN to minimize confusion Standard TPN labs and nursing care orders  Nare Gaspari D. Mina Marble, PharmD, BCPS, [place] 01/27/2019, 11:21 AM

## 2019-01-27 NOTE — Progress Notes (Signed)
Cortrak Tube Team Note:  Consult received to place a Cortrak feeding tube.   Multiple attempts made to pass Cortrak orally were unsuccessful. Patient with severe swelling making it difficult to visualize opening. MD and RN made aware.    Mariana Single RD, LDN Clinical Nutrition Pager # [phone]

## 2019-01-27 NOTE — Progress Notes (Signed)
Two 6 fr sheaths were removed from the right internal jugular vein and manual pressure was held at 15:11.  A quick clot was applied to the incision sites and hemostasis was achieved at 15:21.  Site was dressed with gauze and a Tegaderm.  Site was clean and free of hematomas when site was handed over to RN.    Samuel Jester RT (R) [place] Ravens RT (R)

## 2019-01-27 NOTE — Progress Notes (Signed)
Pharmacy Antibiotic Note  Philip Wolfe is a 68 y.o. male admitted on 01/31/2019 with severe RV failure due to PE requiring ECMO support. Pharmacy has been consulted for vancomycin dosing. Cr improved to 1.27 with diuresis, remains on cefepime, cultures negative.  Plan: Vancomycin 1000mg  IV q12h Continue cefepime 2g IV q8h Vancomycin levels at Css   Height: 5' 9.02" (175.3 cm) Weight: (!) 335 lb 8.6 oz (152.2 kg) IBW/kg (Calculated) : 70.74  Temp (24hrs), Avg:98.1 F (36.7 C), Min:97.7 F (36.5 C), Max:99.1 F (37.3 C)  Recent Labs  Lab 01/25/19 1120 01/25/19 1703 01/26/19 0200 01/26/19 0831 01/26/19 0836 01/26/19 1430 01/26/19 1740 01/26/19 2215 01/27/19 0222 01/27/19 0446  WBC  --  34.9* 36.0*  --  34.5* 35.6* 36.1* 34.0*  --  39.0*  CREATININE  --  1.52* 1.37*  --   --   --  1.29*  --  1.27* 1.27*  LATICACIDVEN 3.9* 3.6*  --  2.3*  --  1.6  --   --   --  1.5    Estimated Creatinine Clearance: 82.5 mL/min (A) (by C-G formula based on SCr of 1.27 mg/dL (H)).    No Known Allergies  Antimicrobials this admission: 12/7 Azithro x1 12/7 CTX x1 12/8 Cefuroxime >> 12/9 12/8 Vancomycin >> 12/9 Cefepime >>  Microbiology results: 12/10 TA: pending 12/8 MRSA: neg 12/8 BCx: NGTD   Thank you for allowing pharmacy to be a part of this patient's care.  Arrie Senate, PharmD, BCPS Clinical Pharmacist [phone] Please check AMION for all [place] numbers 01/27/2019

## 2019-01-27 NOTE — Progress Notes (Signed)
Critical care attending progress note:  68 year old man who suffered a massive pulmonary embolism.  Critically ill due to acute hypercarbic hypoxic respiratory failure and obstructive shock/cardiogenic shock requiring VA ECMO support, ultra lung protective mechanical ventilation, titration of inotropes and vasopressors as well as sedative infusions.  Has underwent attempted catheter directed thrombolysis 4 times.  Heavy right-sided clot burden persists.  Some improvement in vasopressor and inotropic support since yesterday.  Pump speed remains at 4.6.  Limited by cannula size.  Reviewed echocardiogram.  Shows decompressed RV with function similar to yesterday (moderately reduced on lower dose of epinephrine).  Increasing sweep gas requirements.  Have increased ventilator respiratory rate from 10 to 20.   Some signs of improvement with decreased vasopressor requirements, so cautiously optimistic.  Best strategy may be to allow time to recover rather than subjecting patient to further procedure.  CRITICAL CARE Performed by: Kipp Brood   Total critical care time: 45 minutes  Critical care time was exclusive of separately billable procedures and treating other patients.  Critical care was necessary to treat or prevent imminent or life-threatening deterioration.  Critical care was time spent personally by me on the following activities: development of treatment plan with patient and/or surrogate as well as nursing, discussions with consultants, evaluation of patient's response to treatment, examination of patient, obtaining history from patient or surrogate, ordering and performing treatments and interventions, ordering and review of laboratory studies, ordering and review of radiographic studies, pulse oximetry, re-evaluation of patient's condition and participation in multidisciplinary rounds.  Kipp Brood, MD [facility] ICU Physician [place]  Pager:  [phone] Mobile: [phone] After hours: [phone].

## 2019-01-27 NOTE — Progress Notes (Addendum)
Nutrition Follow up  DOCUMENTATION CODES:   Morbid obesity  INTERVENTION:   Once access is obtained:  -Vital 1.5 @ 40 ml/hr (960 ml) -60 ml Prostat TID  Provides: 2040 kcals, 155 grams protein, 733 ml free water. Meets 100% of needs.   NUTRITION DIAGNOSIS:   Inadequate oral intake related to inability to eat as evidenced by NPO status.  Ongoing  GOAL:   Patient will meet greater than or equal to 90% of their needs   Not meeting   MONITOR:   Vent status, Labs, Weight trends, TF tolerance, Skin, I & O's  REASON FOR ASSESSMENT:   Consult, Ventilator Enteral/tube feeding initiation and management  ASSESSMENT:   67 y.o. male with PMH of Arthritis, Hypertension, Sleep apnea, s/p gastric bypass. Presented to ED with progressive SOB. O2 sats in 60s, in respiratory distress on BiPAP in ED, intubated by PCCM. On CT pulmonary emboli identified, and right heart strain noted. Pt has passed several large blood clots from nose and mouth, has significant clot burden per NP.    Pt with history of lap band Pt is at very high risk of acute malnutrition   12/8 s/p IR EKOS cath placement, cannulation for VA ECMO,  ENT following for epistaxis.   12/11Spoke with MD, oral cortrak attempted but unsuccessful, spoke with IR who is unavailable for tube placements today and would need to wait until Monday at earliest, Will order TPN and work towards enteral access next week.  Pt discussed with CCM/CTVS/RN/Pharmacy CCM able to advance OG tube through gastric band, will initiate TF  Admission weight: 137 kg  Current weight: 150.1 kg  Noted deep pitting edema   Patient is currently intubated on ventilator support MV: 4.3 L/min Temp (24hrs), Avg:97.9 F (36.6 C), Min:97.7 F (36.5 C), Max:98.1 F (36.7 C)    I/O: +9762 ml since admit UOP: 6975 ml x 24 hrs    Drips: nimbex (weaning), precedex, epinephrine, lasix, insulin, levophed @ 22, vasopressin @ .04 units  IV Magnesium x 1 10  mEq KCl x 6  Medications: dulcolax, colace, 10 mg reglan QID bs: hgb: 7.3 (L) CBG's: [phone]   Diet Order:   Diet Order            Diet NPO time specified  Diet effective now              EDUCATION NEEDS:   Not appropriate for education at this time  Skin:  Skin Assessment: Reviewed RN Assessment  Last BM:  PTA  Height:   Ht Readings from Last 1 Encounters:  02/09/2019 5' 9.02" (1.753 m)     Weight:   Wt Readings from Last 1 Encounters:  01/27/19 (!) 150.2 kg    Ideal Body Weight:  72.7 kg  BMI:  Body mass index is 48.88 kg/m.  Estimated Nutritional Needs:   Kcal:  2100  Protein:  155-185 grams  Fluid:  >/= 1.6 L  Maylon Peppers RD, LDN, CNSC [phone] Pager [phone] After Hours Pager

## 2019-01-27 NOTE — Progress Notes (Signed)
Echocardiogram 2D Echocardiogram has been performed.  Philip Wolfe Philip Wolfe 01/27/2019, 9:20 AM

## 2019-01-27 NOTE — Progress Notes (Signed)
3 Days Post-Op Procedure(s) (LRB): CANNULATION FOR VA ECMO (EXTRACORPOREAL MEMBRANE OXYGENATION) (N/A) TRANSESOPHAGEAL ECHOCARDIOGRAM (TEE) (N/A) Subjective: Day 3 VA echo for acute respiratory failure and RV dysfunction from pulmonary saddle embolus involving both mainstem PAs. Received second dose of catheter directed TPA overnight. ECMO flow stable at 4.2 L, mixed venous saturation 70% and lactate level down to 1.5.  Urine output excellent. Patient was heavily sedated and paralyzed due to agitation during trip to interventional radiology.  We will try to slowly wean off paralytic and go up on sedation as needed. Nasopharyngeal bleeding effectively controlled with nasal Foley catheter.  We will recheck echocardiogram and continue anticoagulationn with heparin.  Continue broad-spectrum antibiotics.   Objective: Vital signs in last 24 hours: Temp:  [97.7 F (36.5 C)-98.2 F (36.8 C)] 97.9 F (36.6 C) (12/11 0808) Pulse Rate:  [71-90] 83 (12/11 0808) Cardiac Rhythm: Normal sinus rhythm (12/11 0700) Resp:  [0-16] 10 (12/11 0808) BP: (91-94)/(64-65) 91/65 (12/11 0808) SpO2:  [100 %] 100 % (12/11 3016) Arterial Line BP: (78-150)/(-17-85) 86/63 (12/11 0800) FiO2 (%):  [40 %] 40 % (12/11 0840) Weight:  [150.2 kg] 150.2 kg (12/11 0600)  Hemodynamic parameters for last 24 hours: CVP:  [5 mmHg-9 mmHg] 8 mmHg  Intake/Output from previous day: 12/10 0701 - 12/11 0700 In: 0109 [I.V.:4715.6; Blood:688.3; IV Piggyback:871.1] Out: 7100 [Urine:6975] Intake/Output this shift: Total I/O In: 220.9 [I.V.:220.9] Out: 200 [Urine:200]        Exam    General- sedated and not bleeding from mouth    Neck- no JVD, no cervical adenopathy palpable, no carotid bruit   Lungs- clear    Cor- regular rate and rhythm, no murmur , gallop   Abdomen- soft, non-tender   Extremities - warm, non-tender, minimal edema   Neuro- oriented, appropriate, no focal weakness   ECMO cannula sites not  bleeding   Lab Results: Recent Labs    01/26/19 2215 01/27/19 0446 01/27/19 0621  WBC 34.0* 39.0*  --   HGB 8.6* 8.0* 5.4*  HCT 25.0* 23.4* 16.0*  PLT 52* 54*  --    BMET:  Recent Labs    01/27/19 0222 01/27/19 0446 01/27/19 0621  NA 140 140 143  K 3.8 3.8 3.2*  CL 107 102  --   CO2 26 29  --   GLUCOSE 186* 202*  --   BUN 38* 39*  --   CREATININE 1.27* 1.27*  --   CALCIUM 6.0* 6.2*  --     PT/INR:  Recent Labs    01/27/19 0446  LABPROT 16.5*  INR 1.3*   ABG    Component Value Date/Time   PHART 7.404 01/27/2019 0621   HCO3 24.4 01/27/2019 0621   TCO2 26 01/27/2019 0621   ACIDBASEDEF 3.0 (H) 01/26/2019 0555   O2SAT 100.0 01/27/2019 0621   CBG (last 3)  Recent Labs    01/27/19 0455 01/27/19 0713 01/27/19 0820  GLUCAP 194* 189* 183*    Assessment/Plan: S/P Procedure(s) (LRB): CANNULATION FOR VA ECMO (EXTRACORPOREAL MEMBRANE OXYGENATION) (N/A) TRANSESOPHAGEAL ECHOCARDIOGRAM (TEE) (N/A) Continue supportive care - hopefully thrombus regression will occur over next 48-72 hours. Patient tolerating ECMO so far.   LOS: 4 days    Tharon Aquas Trigt III 01/27/2019

## 2019-01-27 NOTE — Progress Notes (Signed)
ANTICOAGULATION CONSULT NOTE   Pharmacy Consult for Heparin  Indication: pulmonary embolus + ECMO  No Known Allergies  Patient Measurements: Height: 5' 9.02" (175.3 cm) Weight: (!) 331 lb 2.1 oz (150.2 kg) IBW/kg (Calculated) : 70.74 Heparin Dosing Weight: 103 kg  Vital Signs: Temp: 97.9 F (36.6 C) (12/11 1730) Temp Source: Bladder (12/11 1615) BP: 95/69 (12/11 1155) Pulse Rate: 85 (12/11 1700)  Labs: Recent Labs    01/25/19 0611 01/25/19 0811 01/26/19 0200 01/26/19 0356 01/26/19 1740 01/26/19 1758 01/26/19 2215 01/27/19 0222 01/27/19 0446 01/27/19 0845 01/27/19 1203 01/27/19 1253 01/27/19 1500 01/27/19 1545 01/27/19 1630  HGB  --   --  8.0*  --  7.7*  --    < >  --  8.0* 7.3* 6.8*  --  7.5* 7.1*  --   HCT  --   --  22.3*  --  22.4*  --    < >  --  23.4* 20.6* 20.0*  --  21.3* 19.8*  --   PLT  --   --  70*   < > 57*   < >  --   --  54* 53*  --   --  48* 50*  --   APTT 42*   < > 104*  --  111*  --   --   --  130*  --   --   --   --   --   --   LABPROT 26.4*  --  18.0*  --   --   --   --   --  16.5*  --   --   --   --   --   --   INR 2.4*  --  1.5*  --   --   --   --   --  1.3*  --   --   --   --   --   --   HEPARINUNFRC  --    < > 0.37  --  0.52   < >   < >  --  0.42  --   --  0.45  --   --  0.42  CREATININE  --    < > 1.37*  --  1.29*  --   --  1.27* 1.27*  --   --   --   --  1.44*  --    < > = values in this interval not displayed.    Estimated Creatinine Clearance: 72.2 mL/min (A) (by C-G formula based on SCr of 1.44 mg/dL (H)).  Assessment: 66 yoM admitted with massive PE and RV failure. Pt received 150mg  total of systemic tPA with continued shock so ECMO started 12/8. EKOS started along with IV heparin, then repeated 12/10 with significant clot burden.  Heparin level remains therapeutic at 0.4. INR normalized. CBC relatively stable s/p pRBCs this morning.   Goal of Therapy:  Heparin level 0.3-0.5 units/ml (while on ECMO) Monitor platelets by  anticoagulation protocol: Yes    Plan:  -Continue heparin 1500 units/hr -Check heparin level (0500/1700)  Erin Hearing PharmD., BCPS Clinical Pharmacist 01/27/2019 5:46 PM

## 2019-01-27 NOTE — Progress Notes (Signed)
Patient ID: PAT SIRES, male   DOB: 1950-06-27, 68 y.o.   MRN: [id number]     Advanced Heart Failure Rounding Note  PCP-Cardiologist: No primary care provider on file.   Subjective:    - Massive PE with cardiogenic shock: [facility] ECM[facility] initiated 12/8. Patient then had PA catheter-directed tPA until 8 am 12/9.   - 12/9 ENT Merocel packing for severe epistaxis.  - PA catheter-directed tPA 12/20 again.    Nasopharyngeal bleeding has slowed after nasal packing by ENT. He had 1 unit PRBCs again this morning, hgb 8 today.   Still with significant pressor requirement: norepinephrine 19, epinephrine 23, vasopressin 0.03.  MAP 80s generally.  CVP 8 with good UOP on Lasix gtt 8 mg/hr, net negative 1100 cc.  Heparin gtt ongoing with therapeutic level. iNO at 30 ppm. Co-ox 81% this morning. LDH 369. Echo yesterday difficult windows but RV still appears hypokinetic.   ABG 7.4/39/260.  CXR with bilateral hazy opacities > on left, suspect effusions.  Empiric cefepime ongoing, afebrile, cultures remain negative.  WBCs high 39.  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   Platelets low but stable 54K.     ECMO:  Flow 4.55 L/min  3600 rpm Venous pressure -104 Delta P 22  Objective:   Weight Range: (!) 152.2 kg Body mass index is 49.53 kg/m.   Vital Signs:   Temp:  [97.7 F (36.5 C)-99.1 F (37.3 C)] 98.1 F (36.7 C) (12/11 0600) Pulse Rate:  [71-91] 78 (12/11 0305) Resp:  [0-16] 0 (12/11 0600) BP: (94)/(64) 94/64 (12/10 1154) SpO2:  [100 %] 100 % (12/11 0305) Arterial Line BP: (78-150)/(-17-85) 102/73 (12/11 0613) FiO2 (%):  [40 %] 40 % (12/11 0305) Last BM Date: (PTA)  Weight change: Filed Weights   02/08/2019 1100 01/25/19 0500 01/26/19 0344  Weight: (!) 146.5 kg (!) 148.2 kg (!) 152.2 kg    Intake/Output:   Intake/Output Summary (Last 24 hours) at 01/27/2019 0740 Last data filed at 01/27/2019 0600 Gross per 24 hour  Intake 6054.52 ml  Output 7100 ml  Net -1045.48 ml      Physical  Exam    General: Intubated/sedated Neck: Thick, no JVD, no thyromegaly or thyroid nodule.  Lungs: Decreased at bases.  CV: Nondisplaced PMI.  Heart regular S1/S2, no S3/S4, ECMO flow sounds.  1+ edema to knees.  Abdomen: Soft, nontender, no hepatosplenomegaly, no distention.  Neurologic: Sedated.  Extremities: Chronic venous stasis changes lower legs/feet.  HEENT: Nasal packing in place.    Telemetry   NSR 80s (personally reviewed)  Labs    CBC Recent Labs    01/26/19 2215 01/27/19 0446 01/27/19 0621  WBC 34.0* 39.0*  --   HGB 8.6* 8.0* 5.4*  HCT 25.0* 23.4* 16.0*  MCV 90.3 91.4  --   PLT 52* 54*  --    Basic Metabolic Panel Recent Labs    01/26/19 0200 01/26/19 0356 01/27/19 0222 01/27/19 0446 01/27/19 0621  NA 140  --  140 140 143  K 3.3*  --  3.8 3.8 3.2*  CL 107   < > 107 102  --   CO2 25   < > 26 29  --   GLUCOSE 211*   < > 186* 202*  --   BUN 43*   < > 38* 39*  --   CREATININE 1.37*   < > 1.27* 1.27*  --   CALCIUM 6.4*   < > 6.0* 6.2*  --   MG 2.1  --   --  1.8  --   PHOS  --   --   --  3.2  --    < > = values in this interval not displayed.   Liver Function Tests Recent Labs    01/26/19 1740 01/27/19 0446  AST 59* 52*  ALT 32 29  ALKPHOS 42 48  BILITOT 0.7 0.9  PROT 3.7* 3.7*  ALBUMIN 1.9* 1.8*   No results for input(s): LIPASE, AMYLASE in the last 72 hours. Cardiac Enzymes No results for input(s): CKTOTAL, CKMB, CKMBINDEX, TROPONINI in the last 72 hours.  BNP: BNP (last 3 results) Recent Labs    01/29/2019 1505  BNP 57.6    ProBNP (last 3 results) No results for input(s): PROBNP in the last 8760 hours.   D-Dimer No results for input(s): DDIMER in the last 72 hours. Hemoglobin A1C No results for input(s): HGBA1C in the last 72 hours. Fasting Lipid Panel No results for input(s): CHOL, HDL, LDLCALC, TRIG, CHOLHDL, LDLDIRECT in the last 72 hours. Thyroid Function Tests No results for input(s): TSH, T4TOTAL, T3FREE, THYROIDAB in  the last 72 hours.  Invalid input(s): FREET3  Other results:   Imaging    CT ANGIO CHEST PE W OR WO CONTRAST  Result Date: 01/26/2019 CLINICAL DATA:  Known recently diagnosed pulmonary embolism on anticoagulant treatment. Follow-up. EXAM: CT ANGIOGRAPHY CHEST WITH CONTRAST TECHNIQUE: Multidetector CT imaging of the chest was performed using the standard protocol during bolus administration of intravenous contrast. Multiplanar CT image reconstructions and MIPs were obtained to evaluate the vascular anatomy. CONTRAST:  73m OMNIPAQUE IOHEXOL 350 MG/ML SOLN COMPARISON:  02/06/2019 FINDINGS: Cardiovascular: Bilateral IJ venous lines present. Mild cardiomegaly. Mild calcified plaque over the 3 vessel coronary arteries. Ectasia of the ascending thoracic aorta measuring 3.5 cm unchanged. Pulmonary arterial system demonstrates evidence of patient's bilateral pulmonary emboli with slight interval over the left lower lobar arteries. Also improvement over the right main pulmonary artery. Persistent moderate burden of emboli over the proximal right upper, middle and lower lobar pulmonary arteries with minimal interval improvement. Evidence of persistent right heart strain (RV/LV is 34.31/27.10 =1.26). Mediastinum/Nodes: No significant mediastinal or hilar adenopathy. Lungs/Pleura: Endotracheal tube has tip approximately 1.4 cm above the carina. Interval development of small bilateral pleural effusions with associated moderate bibasilar compressive atelectasis. Persistent hazy opacification over the left perihilar region with slight interval improvement. New mild patchy peripheral opacification over the anterior right upper lobe. Complete opacification over the proximal lobar bronchi with complete obstruction of the right lower lobe bronchus. Upper Abdomen: Catheter over the IVC extending through the right heart with tip over the SVC just above the superior cavoatrial junction. 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 Mild cholelithiasis. Musculoskeletal: Degenerative change of the spine. Review of the MIP images confirms the above findings. IMPRESSION: 1. Evidence of patient's known moderate bilateral pulmonary emboli right worse than left with mild overall improvement as described. Evidence of persistent right heart strain. 2. New small bilateral pleural effusions with associated moderate compressive atelectasis in the lung bases. Opacification of the proximal right bronchi with obstruction of the right lower lobe bronchus. 3. Slight interval improvement of hazy airspace opacification over the left perihilar region which may be due to asymmetric edema or infection. New mild peripheral opacification over the anterior right upper lobe which may be due to atelectasis or infection. 4.  Tubes and lines as described. 5. Stable ectasia of the ascending thoracic aorta measuring 3.5 cm in AP  diameter. Recommend annual imaging followup by CTA or MRA. This recommendation follows 2010 ACCF/AHA/AATS/ACR/ASA/SCA/SCAI/SIR/STS/SVM Guidelines for the Diagnosis and Management of Patients with Thoracic Aortic Disease. Circulation.2010; 121: N397-Q734. Aortic aneurysm NOS (ICD10-I71.9). 6.  Atherosclerotic coronary artery disease. 7.  Cholelithiasis. Electronically Signed   By: Marin Olp M.D.   On: 01/26/2019 10:40   IR Angiogram Pulmonary Bilateral Selective  Result Date: 01/26/2019 INDICATION: 68 year old with history of massive pulmonary emboli and cardiogenic shock. Patient has already undergone systemic tPA and previous 12 hour catheter directed pulmonary artery thrombolysis. Patient is currently on ECMO and continues to have a large clot burden in the pulmonary arteries. EXAM: BILATERAL PULMONARY ARTERIOGRAMS; SELECTION OF BILATERAL PULMONARY ARTERIES PLACEMENT OF BILATERAL PULMONARY ARTERY INFUSION CATHETERS ULTRASOUND GUIDANCE FOR VASCULAR ACCESS  COMPARISON:  01/21/2019 MEDICATIONS: None. ANESTHESIA/SEDATION: Intubated and on ECMO. FLUOROSCOPY TIME:  Fluoroscopy Time: 23 minutes, 30 seconds, 1937 mGy COMPLICATIONS: None TECHNIQUE: Informed consent was obtained from the patient's wife. Maximal Sterile Barrier Technique was utilized including caps, mask, sterile gowns, sterile gloves, sterile drape, hand hygiene and skin antiseptic. A timeout was performed prior to the initiation of the procedure. Right neck was prepped and draped in sterile fashion. Patient already had a 6 French right jugular sheath in place. Ultrasound confirmed a patent right internal jugular vein. 21 gauge needle was directed into the right internal jugular vein and micropuncture dilator set was placed. A second 6 French vascular sheath was placed. The other 6 [place] vascular sheath was exchanged due to poor function. An angled 6 French pigtail catheter was advanced through the heart into the right pulmonary artery. Right pulmonary arteriogram was performed. A Rosen wire was placed. Pigtail catheter was placed through the other 6 French vascular sheath and advanced into the main pulmonary artery and left pulmonary artery. Catheter was exchanged for a Kumpe catheter with a Bentson wire. Kumpe catheter was advanced into left pulmonary arteries and selective left pulmonary arteriograms were performed. Catheter was advanced into a lower lobe branch and removed over a Rosen wire. A 90 cm, 10 cm infusion length UniFuse catheter was advanced over the wire and positioned in the left lower lobe branch. Attention was directed to placement of the right pulmonary artery catheter. Kumpe catheter was advanced into right pulmonary artery branches and selective angiography was performed. Eventually, catheter was advanced into the right upper trunk that was completely occluded on the arteriogram. A 90 cm x 15 cm infusion length UniFuse catheter was placed into the right upper trunk. Both sheaths were  secured to the skin with suture. FINDINGS: There continues to be a large amount of clot in the distal right pulmonary artery extending into the upper and lower trunks. Compared to the previous examination, there is now some flow into the right lower lobe branches. There continues to be no flow into the right upper lobe branches. Right pulmonary infusion catheter was placed within the right upper trunk. Segmental clot identified in the left pulmonary arteries. Left PA infusion catheter was placed within a left lower segmental branch. IMPRESSION: 1. Pulmonary arteriography demonstrates bilateral pulmonary emboli. Slightly improved flow to the right pulmonary arteries compared to the examination on 02/05/2019. There continues to be a large clot burden in the distal right pulmonary artery and no significant flow into the right upper lobe branches. 2. Successful placement of bilateral pulmonary artery infusion catheters. Initiation of catheter directed thrombolysis. Plan for 1 mg of tPA through in each catheter for total of 2 mg/hour. Plan for 12 hour treatment.  Electronically Signed   By: Markus Daft M.D.   On: 01/26/2019 18:57   IR Angiogram Selective Each Additional Vessel  Result Date: 01/26/2019 INDICATION: 68 year old with history of massive pulmonary emboli and cardiogenic shock. Patient has already undergone systemic tPA and previous 12 hour catheter directed pulmonary artery thrombolysis. Patient is currently on ECMO and continues to have a large clot burden in the pulmonary arteries. EXAM: BILATERAL PULMONARY ARTERIOGRAMS; SELECTION OF BILATERAL PULMONARY ARTERIES PLACEMENT OF BILATERAL PULMONARY ARTERY INFUSION CATHETERS ULTRASOUND GUIDANCE FOR VASCULAR ACCESS COMPARISON:  01/19/2019 MEDICATIONS: None. ANESTHESIA/SEDATION: Intubated and on ECMO. FLUOROSCOPY TIME:  Fluoroscopy Time: 23 minutes, 30 seconds, 9381 mGy COMPLICATIONS: None TECHNIQUE: Informed consent was obtained from the patient's wife. Maximal  Sterile Barrier Technique was utilized including caps, mask, sterile gowns, sterile gloves, sterile drape, hand hygiene and skin antiseptic. A timeout was performed prior to the initiation of the procedure. Right neck was prepped and draped in sterile fashion. Patient already had a 6 French right jugular sheath in place. Ultrasound confirmed a patent right internal jugular vein. 21 gauge needle was directed into the right internal jugular vein and micropuncture dilator set was placed. A second 6 French vascular sheath was placed. The other 6 [place] vascular sheath was exchanged due to poor function. An angled 6 French pigtail catheter was advanced through the heart into the right pulmonary artery. Right pulmonary arteriogram was performed. A Rosen wire was placed. Pigtail catheter was placed through the other 6 French vascular sheath and advanced into the main pulmonary artery and left pulmonary artery. Catheter was exchanged for a Kumpe catheter with a Bentson wire. Kumpe catheter was advanced into left pulmonary arteries and selective left pulmonary arteriograms were performed. Catheter was advanced into a lower lobe branch and removed over a Rosen wire. A 90 cm, 10 cm infusion length UniFuse catheter was advanced over the wire and positioned in the left lower lobe branch. Attention was directed to placement of the right pulmonary artery catheter. Kumpe catheter was advanced into right pulmonary artery branches and selective angiography was performed. Eventually, catheter was advanced into the right upper trunk that was completely occluded on the arteriogram. A 90 cm x 15 cm infusion length UniFuse catheter was placed into the right upper trunk. Both sheaths were secured to the skin with suture. FINDINGS: There continues to be a large amount of clot in the distal right pulmonary artery extending into the upper and lower trunks. Compared to the previous examination, there is now some flow into the right lower lobe  branches. There continues to be no flow into the right upper lobe branches. Right pulmonary infusion catheter was placed within the right upper trunk. Segmental clot identified in the left pulmonary arteries. Left PA infusion catheter was placed within a left lower segmental branch. IMPRESSION: 1. Pulmonary arteriography demonstrates bilateral pulmonary emboli. Slightly improved flow to the right pulmonary arteries compared to the examination on 01/17/2019. There continues to be a large clot burden in the distal right pulmonary artery and no significant flow into the right upper lobe branches. 2. Successful placement of bilateral pulmonary artery infusion catheters. Initiation of catheter directed thrombolysis. Plan for 1 mg of tPA through in each catheter for total of 2 mg/hour. Plan for 12 hour treatment. Electronically Signed   By: Markus Daft M.D.   On: 01/26/2019 18:57   IR Angiogram Selective Each Additional Vessel  Result Date: 01/26/2019 INDICATION: 68 year old with history of massive pulmonary emboli and cardiogenic shock. Patient has already undergone  systemic tPA and previous 12 hour catheter directed pulmonary artery thrombolysis. Patient is currently on ECMO and continues to have a large clot burden in the pulmonary arteries. EXAM: BILATERAL PULMONARY ARTERIOGRAMS; SELECTION OF BILATERAL PULMONARY ARTERIES PLACEMENT OF BILATERAL PULMONARY ARTERY INFUSION CATHETERS ULTRASOUND GUIDANCE FOR VASCULAR ACCESS COMPARISON:  02/04/2019 MEDICATIONS: None. ANESTHESIA/SEDATION: Intubated and on ECMO. FLUOROSCOPY TIME:  Fluoroscopy Time: 23 minutes, 30 seconds, 3220 mGy COMPLICATIONS: None TECHNIQUE: Informed consent was obtained from the patient's wife. Maximal Sterile Barrier Technique was utilized including caps, mask, sterile gowns, sterile gloves, sterile drape, hand hygiene and skin antiseptic. A timeout was performed prior to the initiation of the procedure. Right neck was prepped and draped in sterile  fashion. Patient already had a 6 French right jugular sheath in place. Ultrasound confirmed a patent right internal jugular vein. 21 gauge needle was directed into the right internal jugular vein and micropuncture dilator set was placed. A second 6 French vascular sheath was placed. The other 6 [place] vascular sheath was exchanged due to poor function. An angled 6 French pigtail catheter was advanced through the heart into the right pulmonary artery. Right pulmonary arteriogram was performed. A Rosen wire was placed. Pigtail catheter was placed through the other 6 French vascular sheath and advanced into the main pulmonary artery and left pulmonary artery. Catheter was exchanged for a Kumpe catheter with a Bentson wire. Kumpe catheter was advanced into left pulmonary arteries and selective left pulmonary arteriograms were performed. Catheter was advanced into a lower lobe branch and removed over a Rosen wire. A 90 cm, 10 cm infusion length UniFuse catheter was advanced over the wire and positioned in the left lower lobe branch. Attention was directed to placement of the right pulmonary artery catheter. Kumpe catheter was advanced into right pulmonary artery branches and selective angiography was performed. Eventually, catheter was advanced into the right upper trunk that was completely occluded on the arteriogram. A 90 cm x 15 cm infusion length UniFuse catheter was placed into the right upper trunk. Both sheaths were secured to the skin with suture. FINDINGS: There continues to be a large amount of clot in the distal right pulmonary artery extending into the upper and lower trunks. Compared to the previous examination, there is now some flow into the right lower lobe branches. There continues to be no flow into the right upper lobe branches. Right pulmonary infusion catheter was placed within the right upper trunk. Segmental clot identified in the left pulmonary arteries. Left PA infusion catheter was placed within  a left lower segmental branch. IMPRESSION: 1. Pulmonary arteriography demonstrates bilateral pulmonary emboli. Slightly improved flow to the right pulmonary arteries compared to the examination on 02/07/2019. There continues to be a large clot burden in the distal right pulmonary artery and no significant flow into the right upper lobe branches. 2. Successful placement of bilateral pulmonary artery infusion catheters. Initiation of catheter directed thrombolysis. Plan for 1 mg of tPA through in each catheter for total of 2 mg/hour. Plan for 12 hour treatment. Electronically Signed   By: Markus Daft M.D.   On: 01/26/2019 18:57   IR US Guide Vasc Access Right  Result Date: 01/26/2019 INDICATION: 68 year old with history of massive pulmonary emboli and cardiogenic shock. Patient has already undergone systemic tPA and previous 12 hour catheter directed pulmonary artery thrombolysis. Patient is currently on ECMO and continues to have a large clot burden in the pulmonary arteries. EXAM: BILATERAL PULMONARY ARTERIOGRAMS; SELECTION OF BILATERAL PULMONARY ARTERIES PLACEMENT OF BILATERAL PULMONARY  ARTERY INFUSION CATHETERS ULTRASOUND GUIDANCE FOR VASCULAR ACCESS COMPARISON:  02/12/2019 MEDICATIONS: None. ANESTHESIA/SEDATION: Intubated and on ECMO. FLUOROSCOPY TIME:  Fluoroscopy Time: 23 minutes, 30 seconds, 6578 mGy COMPLICATIONS: None TECHNIQUE: Informed consent was obtained from the patient's wife. Maximal Sterile Barrier Technique was utilized including caps, mask, sterile gowns, sterile gloves, sterile drape, hand hygiene and skin antiseptic. A timeout was performed prior to the initiation of the procedure. Right neck was prepped and draped in sterile fashion. Patient already had a 6 French right jugular sheath in place. Ultrasound confirmed a patent right internal jugular vein. 21 gauge needle was directed into the right internal jugular vein and micropuncture dilator set was placed. A second 6 French vascular sheath  was placed. The other 6 [place] vascular sheath was exchanged due to poor function. An angled 6 French pigtail catheter was advanced through the heart into the right pulmonary artery. Right pulmonary arteriogram was performed. A Rosen wire was placed. Pigtail catheter was placed through the other 6 French vascular sheath and advanced into the main pulmonary artery and left pulmonary artery. Catheter was exchanged for a Kumpe catheter with a Bentson wire. Kumpe catheter was advanced into left pulmonary arteries and selective left pulmonary arteriograms were performed. Catheter was advanced into a lower lobe branch and removed over a Rosen wire. A 90 cm, 10 cm infusion length UniFuse catheter was advanced over the wire and positioned in the left lower lobe branch. Attention was directed to placement of the right pulmonary artery catheter. Kumpe catheter was advanced into right pulmonary artery branches and selective angiography was performed. Eventually, catheter was advanced into the right upper trunk that was completely occluded on the arteriogram. A 90 cm x 15 cm infusion length UniFuse catheter was placed into the right upper trunk. Both sheaths were secured to the skin with suture. FINDINGS: There continues to be a large amount of clot in the distal right pulmonary artery extending into the upper and lower trunks. Compared to the previous examination, there is now some flow into the right lower lobe branches. There continues to be no flow into the right upper lobe branches. Right pulmonary infusion catheter was placed within the right upper trunk. Segmental clot identified in the left pulmonary arteries. Left PA infusion catheter was placed within a left lower segmental branch. IMPRESSION: 1. Pulmonary arteriography demonstrates bilateral pulmonary emboli. Slightly improved flow to the right pulmonary arteries compared to the examination on 01/18/2019. There continues to be a large clot burden in the distal right  pulmonary artery and no significant flow into the right upper lobe branches. 2. Successful placement of bilateral pulmonary artery infusion catheters. Initiation of catheter directed thrombolysis. Plan for 1 mg of tPA through in each catheter for total of 2 mg/hour. Plan for 12 hour treatment. Electronically Signed   By: Markus Daft M.D.   On: 01/26/2019 18:57   DG CHEST PORT 1 VIEW  Result Date: 01/26/2019 CLINICAL DATA:  Central line placement, intubation, ECMO EXAM: PORTABLE CHEST 1 VIEW COMPARISON:  Same day chest radiograph, 11/26/2018 FINDINGS: Interval placement of a left neck vascular catheter and sheath, tip projecting over the left brachiocephalic vein. Unchanged right neck vascular catheter. Inferior approach venous ECMO cannula over the right atrium. Endotracheal tube in unchanged position. Cardiomegaly. Bilateral pleural effusions and associated atelectasis or consolidation. No new airspace opacity. IMPRESSION: 1. Interval placement of a left neck vascular catheter and sheath, tip projecting over the left brachiocephalic vein. 2. Otherwise unchanged AP portable examination with extensive support  apparatus including endotracheal tube and ECMO. Electronically Signed   By: Eddie Candle M.D.   On: 01/26/2019 12:35   ECHOCARDIOGRAM LIMITED  Result Date: 01/26/2019   ECHOCARDIOGRAM LIMITED REPORT   Patient Name:   JEREMIAN WHITBY Date of Exam: 01/26/2019 Medical Rec #:  [id number]     Height:       69.0 in Accession #:    [id number]    Weight:       335.5 lb Date of Birth:  May 29, 1950      BSA:          2.57 m Patient Age:    65 years      BP:           116/72 mmHg Patient Gender: M             HR:           89 bpm. Exam Location:  Inpatient  Procedure: Limited Echo Indications:    Pulmonary embolism  History:        Patient has prior history of Echocardiogram examinations, most                 recent 01/25/2019. [facility] ECMO, Cardiogenic shock, pulmonary                 embolism, resp. failure.  Sonographer:     Dustin Flock Referring Phys: [place] Comments: Technically difficult study due to poor echo windows, Technically challenging study due to limited acoustic windows and patient is morbidly obese. Image acquisition challenging due to patient body habitus. IMPRESSIONS  1. Extremely limited; LV and RV function cannot be determined with this study; no pericardial effusion; no other useful information obtained; suggest TEE if clinically indicated. FINDINGS  Left Ventricle: Left ventricular ejection fraction, by visual estimation, is Unable to quantitate%. The left ventricle has unable to quantitate function. The left ventricle is not well visualized. There is no left ventricular hypertrophy. Right Ventricle: The right ventricular size is not well visualized. Right vetricular wall thickness was not assessed. Global RV systolic function is was not assessed. Left Atrium: Left atrial size was not assessed. Right Atrium: Right atrial size was not assessed Pericardium: There is no evidence of pericardial effusion. Mitral Valve: The mitral valve was not well visualized. Not assessed mitral valve regurgitation. Tricuspid Valve: The tricuspid valve is not well visualized. Tricuspid valve regurgitation Not assessed. Aortic Valve: The aortic valve was not well visualized. Aortic valve regurgitation not assessed. Pulmonic Valve: The pulmonic valve was not well visualized. Pulmonic valve regurgitation Not assessed. Aorta: The aortic root is normal in size and structure. Shunts: The interatrial septum was not assessed.  Kirk Ruths MD Electronically signed by Kirk Ruths MD Signature Date/Time: 01/26/2019/12:06:32 PM    Final    IR INFUSION THROMBOL ARTERIAL INITIAL (MS)  Result Date: 01/26/2019 INDICATION: 68 year old with history of massive pulmonary emboli and cardiogenic shock. Patient has already undergone systemic tPA and previous 12 hour catheter directed pulmonary artery thrombolysis.  Patient is currently on ECMO and continues to have a large clot burden in the pulmonary arteries. EXAM: BILATERAL PULMONARY ARTERIOGRAMS; SELECTION OF BILATERAL PULMONARY ARTERIES PLACEMENT OF BILATERAL PULMONARY ARTERY INFUSION CATHETERS ULTRASOUND GUIDANCE FOR VASCULAR ACCESS COMPARISON:  02/07/2019 MEDICATIONS: None. ANESTHESIA/SEDATION: Intubated and on ECMO. FLUOROSCOPY TIME:  Fluoroscopy Time: 23 minutes, 30 seconds, 8295 mGy COMPLICATIONS: None TECHNIQUE: Informed consent was obtained from the patient's wife. Maximal Sterile Barrier Technique was utilized including caps, mask, sterile  gowns, sterile gloves, sterile drape, hand hygiene and skin antiseptic. A timeout was performed prior to the initiation of the procedure. Right neck was prepped and draped in sterile fashion. Patient already had a 6 French right jugular sheath in place. Ultrasound confirmed a patent right internal jugular vein. 21 gauge needle was directed into the right internal jugular vein and micropuncture dilator set was placed. A second 6 French vascular sheath was placed. The other 6 [place] vascular sheath was exchanged due to poor function. An angled 6 French pigtail catheter was advanced through the heart into the right pulmonary artery. Right pulmonary arteriogram was performed. A Rosen wire was placed. Pigtail catheter was placed through the other 6 French vascular sheath and advanced into the main pulmonary artery and left pulmonary artery. Catheter was exchanged for a Kumpe catheter with a Bentson wire. Kumpe catheter was advanced into left pulmonary arteries and selective left pulmonary arteriograms were performed. Catheter was advanced into a lower lobe branch and removed over a Rosen wire. A 90 cm, 10 cm infusion length UniFuse catheter was advanced over the wire and positioned in the left lower lobe branch. Attention was directed to placement of the right pulmonary artery catheter. Kumpe catheter was advanced into right  pulmonary artery branches and selective angiography was performed. Eventually, catheter was advanced into the right upper trunk that was completely occluded on the arteriogram. A 90 cm x 15 cm infusion length UniFuse catheter was placed into the right upper trunk. Both sheaths were secured to the skin with suture. FINDINGS: There continues to be a large amount of clot in the distal right pulmonary artery extending into the upper and lower trunks. Compared to the previous examination, there is now some flow into the right lower lobe branches. There continues to be no flow into the right upper lobe branches. Right pulmonary infusion catheter was placed within the right upper trunk. Segmental clot identified in the left pulmonary arteries. Left PA infusion catheter was placed within a left lower segmental branch. IMPRESSION: 1. Pulmonary arteriography demonstrates bilateral pulmonary emboli. Slightly improved flow to the right pulmonary arteries compared to the examination on 01/19/2019. There continues to be a large clot burden in the distal right pulmonary artery and no significant flow into the right upper lobe branches. 2. Successful placement of bilateral pulmonary artery infusion catheters. Initiation of catheter directed thrombolysis. Plan for 1 mg of tPA through in each catheter for total of 2 mg/hour. Plan for 12 hour treatment. Electronically Signed   By: Markus Daft M.D.   On: 01/26/2019 18:57     Medications:     Scheduled Medications: . sodium chloride   Intravenous Once  . sodium chloride   Intravenous Once  . acetaminophen  1,000 mg Oral Q6H   Or  . acetaminophen (TYLENOL) oral liquid 160 mg/5 mL  1,000 mg Per Tube Q6H  . artificial tears  1 application Both Eyes V7Q  . bisacodyl  10 mg Oral Daily   Or  . bisacodyl  10 mg Rectal Daily  . chlorhexidine gluconate (MEDLINE KIT)  15 mL Mouth Rinse BID  . Chlorhexidine Gluconate Cloth  6 each Topical Daily  . docusate sodium  200 mg Oral  Daily  . mouth rinse  15 mL Mouth Rinse 10 times per day  . metoCLOPramide (REGLAN) injection  10 mg Intravenous Q6H  . pantoprazole (PROTONIX) IV  40 mg Intravenous Q24H  . sodium chloride flush  3 mL Intravenous Q12H  . sodium chloride  flush  3 mL Intravenous Q12H  . sodium chloride flush  3 mL Intravenous Q12H    Infusions: . sodium chloride    . sodium chloride    . sodium chloride Stopped (01/26/19 1741)  . sodium chloride Stopped (01/26/19 1741)  . sodium chloride    . sodium chloride    . sodium chloride    . sodium chloride 20.8 mL/hr at 01/27/19 0600  . sodium chloride 20.8 mL/hr at 01/27/19 0600  . calcium gluconate    . ceFEPime (MAXIPIME) IV Stopped (01/27/19 0224)  . cisatracurium (NIMBEX) infusion 4.5 mcg/kg/min (01/27/19 4401)  . dexmedetomidine (PRECEDEX) IV infusion Stopped (01/26/19 1143)  . epinephrine 23 mcg/min (01/27/19 0600)  . famotidine (PEPCID) IV Stopped (01/26/19 2248)  . fentaNYL infusion INTRAVENOUS 275 mcg/hr (01/27/19 0649)  . furosemide (LASIX) infusion 8 mg/hr (01/27/19 0600)  . heparin 1,500 Units/hr (01/27/19 0600)  . insulin 6 mL/hr at 01/27/19 0600  . lactated ringers 10 mL/hr at 01/25/19 1031  . lactated ringers Stopped (01/29/2019 2159)  . magnesium sulfate bolus IVPB    . midazolam 10 mg/hr (01/27/19 0600)  . norepinephrine (LEVOPHED) Adult infusion 19 mcg/min (01/27/19 0600)  . potassium chloride 10 mEq (01/27/19 0704)  . vancomycin    . vasopressin (PITRESSIN) infusion - *FOR SHOCK* 0.03 Units/min (01/27/19 0600)    PRN Medications: sodium chloride, sodium chloride, sodium chloride, bisacodyl, calcium chloride, dextrose, fentaNYL, fentaNYL, metoprolol tartrate, midazolam, midazolam, morphine injection, ondansetron (ZOFRAN) IV, oxyCODONE, sodium chloride flush, sodium chloride flush, sodium chloride flush, traMADol   Assessment/Plan   1.  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  VA ECMO  initiated 12/8.  Currently good flow (4-5 L/min range) at 3600 rpm.  Still with significant pressor requirement despite catheter-directed tPA x 2 now.  He remains on norepinephrine 20, epinephrine 23, vasopressin 0.03.  Echo from yesterday reviewed, RV remains hypokinetic though study difficult.  MAP 80s with CVP 8. Co-ox 81%. Now on Lasix gtt at 8 mg/hr with good UOP, net negative I/Os.  Creatinine trending down, 1.27. Lactate is now normal.  - Continue to titrate down pressor support as able.  - Continue iNO 30 ppm for RV failure.  - Limited echo today to reassess his RV.   2. AKI: Creatinine improving with VA ECMO support, down to 1.27.    3. Acute hypoxic/hypoxemic respiratory failure: FiO2 0.4, improved ABG on ECMO.  4. ID: WBCs elevated but afebrile.  No PNA on CXR.  PCT not elevated initially.  Blood and trach aspirate cultures so far negative.  - Empiric coverage with cefepime and will add vancomycin.  5. Pulmonary embolus: Massive bilateral PE.  He got 150 mg IV total tPA initially, then catheter directed tPA to both PAs on 12/9 and again on 12/10.  RV still hypokinetic on yesterday's echo.  Still with significant pressor requirement.   - Discussed with surgeons, will reach out to [facility] to discuss surgical thrombectomy at this point.   - Continue heparin gtt.  6. ENT: ENT bleeding, has had nasal packing by ENT.  7. Acute blood loss anemia: ENT bleeding, transfuse hgb < 8. He got another unit last night. Hgb 8 this morning.  8. Thrombocytopenia: Plts 54 K this morning, stable.  Suspect due to critical illness.  - Transfuse plts < 50K.  9. FEN: Will need tube feeds.   CRITICAL CARE Performed by: Loralie Champagne  Total critical care time: 45 minutes  Critical  care time was exclusive of separately billable procedures and treating other patients.  Critical care was necessary to treat or prevent imminent or life-threatening deterioration.  Critical care was time spent personally by me on  the following activities: development of treatment plan with patient and/or surrogate as well as nursing, discussions with consultants, evaluation of patient's response to treatment, examination of patient, obtaining history from patient or surrogate, ordering and performing treatments and interventions, ordering and review of laboratory studies, ordering and review of radiographic studies, pulse oximetry and re-evaluation of patient's condition.   Length of Stay: 4  Loralie Champagne, MD  01/27/2019, 7:40 AM  Advanced Heart Failure Team Pager [phone] (M-F; 7a - 4p)  Please contact [facility] for night-coverage after hours (4p -7a ) and weekends on [email or web address]

## 2019-01-27 NOTE — Progress Notes (Signed)
RT NOTE: RT arrived bedside and patient RR was increased from 10 to 20 by Dr.Agarwala per RN at bedside. RT will continue to monitor.

## 2019-01-27 NOTE — Progress Notes (Addendum)
NAME:  Philip Wolfe, MRN:  [id number], DOB:  Jul 15, 1950, LOS: 4 ADMISSION DATE:  01/29/2019, CONSULTATION DATE:  02/10/2019 REFERRING MD:  Dr. Ralene Bathe EDP, CHIEF COMPLAINT:  Hypoxia   Brief History   68 year old male admitted 12/7 with massive PE s/p thrombolytics and catheter directed lysis.  Has cardiac arrest, significant RV failure requiring initiation of ECMO  Past Medical History   has a past medical history of Arthritis, Hypertension, Sleep apnea, Ulcer (left medial ankle), and Venous stasis ulcer ([place]).   Significant Hospital Events   12/7-Admit, intubated coded, given TPA 800 mg and then 50 mg 6 hours later 12/8- Cannulated for VA ECMO 02/08/2019 and underwent EKOS directed catheter placement.  Inhaled NO started 12/9- Significant epistaxis requiring 3 units PRBC and nasal packing by ENT 12/10-second round of EKOS directed catheter TPA, nasal packing replaced by [facility] catheter by ENT  Consults:  PCCM, cardiology, cardiothoracic surgery  Procedures:  R axillary arterial catheter, R femoral venous catheter, RIJ introducer, LIJ MML, L radial artery  Significant Diagnostic Tests:  CTA 12/7-massive bilateral central pulmonary emboli with significant decrease in pulmonary artery flow, patchy groundglass infiltrate, rib fractures on the right.  CT head 12/7-generalized atrophy, chronic ischemic microangiopathy.  Echo 12/9-severe reduction in RV systolic function with RV volume and pressure overload.  LVEF 50-55%  Micro Data:    Antimicrobials:  Cefepime 12/10 > Vanco 12/9  Interim history/subjective:  Remains on ECMO Epistaxis has improved Remains on pressors, NO  Objective   Blood pressure 91/65, pulse 83, temperature 97.9 F (36.6 C), resp. rate 10, height 5' 9.02" (1.753 m), weight (!) 150.2 kg, SpO2 100 %. CVP:  [5 mmHg-9 mmHg] 8 mmHg  Vent Mode: PCV FiO2 (%):  [40 %] 40 % Set Rate:  [10 bmp-16 bmp] 10 bmp Vt Set:  [420 mL] 420 mL PEEP:  [5 cmH20] 5 cmH20 Plateau  Pressure:  [12 cmH20-18 cmH20] 16 cmH20   Intake/Output Summary (Last 24 hours) at 01/27/2019 1021 Last data filed at 01/27/2019 0900 Gross per 24 hour  Intake 6281.26 ml  Output 7425 ml  Net -1143.74 ml   Filed Weights   01/25/19 0500 01/26/19 0344 01/27/19 0600  Weight: (!) 148.2 kg (!) 152.2 kg (!) 150.2 kg   Examination: Gen:      No acute distress HEENT:  EOMI, sclera anicteric. ETT Neck:     No masses; no thyromegaly Lungs:    Clear to auscultation bilaterally; normal respiratory effort CV:         Regular rate and rhythm; no murmurs Abd:      + bowel sounds; soft, non-tender; no palpable masses, no distension Ext:    No edema; adequate peripheral perfusion Skin:      Warm and dry; no rash Neuro: Sedated, unresponsive  Chest x-ray 12/11 reviewed with stable line placement, bibasal opacities.  Resolved Hospital Problem list     Assessment & Plan:  Acute cor pulmonale due to massive PE-  S/p TPA x 2 and EKOS X 2 On VA ECMO axillary-femoral cannulation. Still hemodynamically unstable.  Suspect that he has persistent clot burden Further TPA is unlikely to be beneficial at this point We will review echocardiogram and determine if surgical thrombectomy versus catheter-based thrombectomy is required Can consider [facility]. We will discuss with cardiothoracic surgery and cardiology Continue pressors, inhaled NO, heparin drip  Acute hypoxic, hypercarbic respiratory failure Change to pressure control ventilation with PEEP 5, expiratory pressure 10 Follow ABG  Continue empiric antibiotic for leukocytosis Start bleeding paralytics  Epistaxis S/p packing.  Continue monitoring CBC, transfuse as needed  Hypoglycemia Continue insulin drip   Best practice:  Diet: Place cortrak and start feeding Pain/Anxiety/Delirium protocol (if indicated): Precedex, Versed, fentanyl, paralytic VAP protocol (if indicated): Ordered DVT prophylaxis: Heparin Drip GI  prophylaxis: Pepcid Glucose control: Insulin drip Mobility: Bed Code Status: Full Family Communication: Updated at bedside Disposition: ICU  Critical care time:     The patient is critically ill with multiple organ system failure and requires high complexity decision making for assessment and support, frequent evaluation and titration of therapies, advanced monitoring, review of radiographic studies and interpretation of complex data.   Critical Care Time devoted to patient care services, exclusive of separately billable procedures, described in this note is 35 minutes.   Chilton Greathouse MD [facility] 01/27/2019, 10:27 AM

## 2019-01-27 NOTE — Anesthesia Postprocedure Evaluation (Addendum)
Anesthesia Post Note  Patient: Philip Wolfe  Procedure(s) Performed: CANNULATION FOR VA ECMO (EXTRACORPOREAL MEMBRANE OXYGENATION) (N/A ) TRANSESOPHAGEAL ECHOCARDIOGRAM (TEE) (N/A )     Patient location during evaluation: Radiology Anesthesia Type: General Level of consciousness: sedated Pain management: pain level controlled Vital Signs Assessment: post-procedure vital signs reviewed and stable Respiratory status: patient remains intubated per anesthesia plan Cardiovascular status: stable Postop Assessment: no apparent nausea or vomiting Anesthetic complications: no    Last Vitals:  Vitals:   01/27/19 0600 01/27/19 0700  BP:    Pulse:    Resp: (!) 0 10  Temp: 36.7 C 36.7 C  SpO2:  100%    Last Pain:  Vitals:   01/26/19 2010  TempSrc: Bladder  PainSc:                  Crocker S

## 2019-01-28 ENCOUNTER — Inpatient Hospital Stay (HOSPITAL_COMMUNITY): Payer: BC Managed Care – PPO

## 2019-01-28 DIAGNOSIS — I2602 Saddle embolus of pulmonary artery with acute cor pulmonale: Secondary | ICD-10-CM

## 2019-01-28 DIAGNOSIS — Z515 Encounter for palliative care: Secondary | ICD-10-CM

## 2019-01-28 LAB — COMPREHENSIVE METABOLIC PANEL
ALT: 22 U/L (ref 0–44)
ALT: 25 U/L (ref 0–44)
AST: 42 U/L — ABNORMAL HIGH (ref 15–41)
AST: 51 U/L — ABNORMAL HIGH (ref 15–41)
Albumin: 1.6 g/dL — ABNORMAL LOW (ref 3.5–5.0)
Albumin: 2 g/dL — ABNORMAL LOW (ref 3.5–5.0)
Alkaline Phosphatase: 43 U/L (ref 38–126)
Alkaline Phosphatase: 45 U/L (ref 38–126)
Anion gap: 9 (ref 5–15)
Anion gap: 7 (ref 5–15)
BUN: 50 mg/dL — ABNORMAL HIGH (ref 8–23)
BUN: 53 mg/dL — ABNORMAL HIGH (ref 8–23)
CO2: 26 mmol/L (ref 22–32)
CO2: 27 mmol/L (ref 22–32)
Calcium: 6.2 mg/dL — CL (ref 8.9–10.3)
Calcium: 6.4 mg/dL — CL (ref 8.9–10.3)
Chloride: 107 mmol/L (ref 98–111)
Chloride: 106 mmol/L (ref 98–111)
Creatinine, Ser: 1.7 mg/dL — ABNORMAL HIGH (ref 0.61–1.24)
Creatinine, Ser: 1.55 mg/dL — ABNORMAL HIGH (ref 0.61–1.24)
GFR calc Af Amer: 47 mL/min — ABNORMAL LOW (ref >60)
GFR calc Af Amer: 53 mL/min — ABNORMAL LOW (ref >60)
GFR calc non Af Amer: 41 mL/min — ABNORMAL LOW (ref >60)
GFR calc non Af Amer: 46 mL/min — ABNORMAL LOW (ref >60)
Glucose, Bld: 171 mg/dL — ABNORMAL HIGH (ref 70–99)
Glucose, Bld: 161 mg/dL — ABNORMAL HIGH (ref 70–99)
Potassium: 3.8 mmol/L (ref 3.5–5.1)
Potassium: 3.8 mmol/L (ref 3.5–5.1)
Sodium: 142 mmol/L (ref 135–145)
Sodium: 140 mmol/L (ref 135–145)
Total Bilirubin: 0.9 mg/dL (ref 0.3–1.2)
Total Bilirubin: 1.2 mg/dL (ref 0.3–1.2)
Total Protein: 3.3 g/dL — ABNORMAL LOW (ref 6.5–8.1)
Total Protein: 3.8 g/dL — ABNORMAL LOW (ref 6.5–8.1)

## 2019-01-28 LAB — POCT I-STAT 7, (LYTES, BLD GAS, ICA,H+H)
Acid-Base Excess: 6 mmol/L — ABNORMAL HIGH (ref 0.0–2.0)
Acid-Base Excess: 1 mmol/L (ref 0.0–2.0)
Acid-Base Excess: 7 mmol/L — ABNORMAL HIGH (ref 0.0–2.0)
Acid-Base Excess: 6 mmol/L — ABNORMAL HIGH (ref 0.0–2.0)
Bicarbonate: 29.8 mmol/L — ABNORMAL HIGH (ref 20.0–28.0)
Bicarbonate: 25.5 mmol/L (ref 20.0–28.0)
Bicarbonate: 30.8 mmol/L — ABNORMAL HIGH (ref 20.0–28.0)
Bicarbonate: 29.3 mmol/L — ABNORMAL HIGH (ref 20.0–28.0)
Calcium, Ion: 0.95 mmol/L — ABNORMAL LOW (ref 1.15–1.40)
Calcium, Ion: 0.9 mmol/L — ABNORMAL LOW (ref 1.15–1.40)
Calcium, Ion: 0.96 mmol/L — ABNORMAL LOW (ref 1.15–1.40)
Calcium, Ion: 1 mmol/L — ABNORMAL LOW (ref 1.15–1.40)
HCT: 19 % — ABNORMAL LOW (ref 39.0–52.0)
HCT: 17 % — ABNORMAL LOW (ref 39.0–52.0)
HCT: 25 % — ABNORMAL LOW (ref 39.0–52.0)
HCT: 21 % — ABNORMAL LOW (ref 39.0–52.0)
Hemoglobin: 6.5 g/dL — CL (ref 13.0–17.0)
Hemoglobin: 5.8 g/dL — CL (ref 13.0–17.0)
Hemoglobin: 8.5 g/dL — ABNORMAL LOW (ref 13.0–17.0)
Hemoglobin: 7.1 g/dL — ABNORMAL LOW (ref 13.0–17.0)
O2 Saturation: 100 %
O2 Saturation: 100 %
O2 Saturation: 100 %
O2 Saturation: 100 %
Patient temperature: 36.5
Patient temperature: 36.4
Patient temperature: 36.5
Patient temperature: 37
Potassium: 3.7 mmol/L (ref 3.5–5.1)
Potassium: 3.2 mmol/L — ABNORMAL LOW (ref 3.5–5.1)
Potassium: 3.8 mmol/L (ref 3.5–5.1)
Potassium: 3.6 mmol/L (ref 3.5–5.1)
Sodium: 140 mmol/L (ref 135–145)
Sodium: 141 mmol/L (ref 135–145)
Sodium: 141 mmol/L (ref 135–145)
Sodium: 141 mmol/L (ref 135–145)
TCO2: 31 mmol/L (ref 22–32)
TCO2: 27 mmol/L (ref 22–32)
TCO2: 32 mmol/L (ref 22–32)
TCO2: 30 mmol/L (ref 22–32)
pCO2 arterial: 37.9 mmHg (ref 32.0–48.0)
pCO2 arterial: 37.8 mmHg (ref 32.0–48.0)
pCO2 arterial: 41.6 mmHg (ref 32.0–48.0)
pCO2 arterial: 34.7 mmHg (ref 32.0–48.0)
pH, Arterial: 7.502 — ABNORMAL HIGH (ref 7.350–7.450)
pH, Arterial: 7.435 (ref 7.350–7.450)
pH, Arterial: 7.476 — ABNORMAL HIGH (ref 7.350–7.450)
pH, Arterial: 7.534 — ABNORMAL HIGH (ref 7.350–7.450)
pO2, Arterial: 323 mmHg — ABNORMAL HIGH (ref 83.0–108.0)
pO2, Arterial: 245 mmHg — ABNORMAL HIGH (ref 83.0–108.0)
pO2, Arterial: 267 mmHg — ABNORMAL HIGH (ref 83.0–108.0)
pO2, Arterial: 209 mmHg — ABNORMAL HIGH (ref 83.0–108.0)

## 2019-01-28 LAB — CBC
HCT: 22 % — ABNORMAL LOW (ref 39.0–52.0)
HCT: 20.6 % — ABNORMAL LOW (ref 39.0–52.0)
HCT: 21.9 % — ABNORMAL LOW (ref 39.0–52.0)
HCT: 21 % — ABNORMAL LOW (ref 39.0–52.0)
HCT: 24.8 % — ABNORMAL LOW (ref 39.0–52.0)
HCT: 23.5 % — ABNORMAL LOW (ref 39.0–52.0)
HCT: 22.9 % — ABNORMAL LOW (ref 39.0–52.0)
Hemoglobin: 7.7 g/dL — ABNORMAL LOW (ref 13.0–17.0)
Hemoglobin: 7 g/dL — ABNORMAL LOW (ref 13.0–17.0)
Hemoglobin: 7.4 g/dL — ABNORMAL LOW (ref 13.0–17.0)
Hemoglobin: 7.1 g/dL — ABNORMAL LOW (ref 13.0–17.0)
Hemoglobin: 8.5 g/dL — ABNORMAL LOW (ref 13.0–17.0)
Hemoglobin: 8.1 g/dL — ABNORMAL LOW (ref 13.0–17.0)
Hemoglobin: 7.8 g/dL — ABNORMAL LOW (ref 13.0–17.0)
MCH: 31.7 pg (ref 26.0–34.0)
MCH: 30.8 pg (ref 26.0–34.0)
MCH: 31.1 pg (ref 26.0–34.0)
MCH: 31 pg (ref 26.0–34.0)
MCH: 30.9 pg (ref 26.0–34.0)
MCH: 30.8 pg (ref 26.0–34.0)
MCH: 31.1 pg (ref 26.0–34.0)
MCHC: 35 g/dL (ref 30.0–36.0)
MCHC: 34 g/dL (ref 30.0–36.0)
MCHC: 33.8 g/dL (ref 30.0–36.0)
MCHC: 33.8 g/dL (ref 30.0–36.0)
MCHC: 34.3 g/dL (ref 30.0–36.0)
MCHC: 34.5 g/dL (ref 30.0–36.0)
MCHC: 34.1 g/dL (ref 30.0–36.0)
MCV: 90.5 fL (ref 80.0–100.0)
MCV: 90.7 fL (ref 80.0–100.0)
MCV: 92 fL (ref 80.0–100.0)
MCV: 91.7 fL (ref 80.0–100.0)
MCV: 90.2 fL (ref 80.0–100.0)
MCV: 89.4 fL (ref 80.0–100.0)
MCV: 91.2 fL (ref 80.0–100.0)
Platelets: 67 10*3/uL — ABNORMAL LOW (ref 150–400)
Platelets: 65 10*3/uL — ABNORMAL LOW (ref 150–400)
Platelets: 60 10*3/uL — ABNORMAL LOW (ref 150–400)
Platelets: 59 10*3/uL — ABNORMAL LOW (ref 150–400)
Platelets: 57 10*3/uL — ABNORMAL LOW (ref 150–400)
Platelets: 57 10*3/uL — ABNORMAL LOW (ref 150–400)
Platelets: 60 10*3/uL — ABNORMAL LOW (ref 150–400)
RBC: 2.43 MIL/uL — ABNORMAL LOW (ref 4.22–5.81)
RBC: 2.27 MIL/uL — ABNORMAL LOW (ref 4.22–5.81)
RBC: 2.38 MIL/uL — ABNORMAL LOW (ref 4.22–5.81)
RBC: 2.29 MIL/uL — ABNORMAL LOW (ref 4.22–5.81)
RBC: 2.75 MIL/uL — ABNORMAL LOW (ref 4.22–5.81)
RBC: 2.63 MIL/uL — ABNORMAL LOW (ref 4.22–5.81)
RBC: 2.51 MIL/uL — ABNORMAL LOW (ref 4.22–5.81)
RDW: 13.7 % (ref 11.5–15.5)
RDW: 13.8 % (ref 11.5–15.5)
RDW: 14.1 % (ref 11.5–15.5)
RDW: 14.3 % (ref 11.5–15.5)
RDW: 13.9 % (ref 11.5–15.5)
RDW: 13.8 % (ref 11.5–15.5)
RDW: 13.9 % (ref 11.5–15.5)
WBC: 32.2 10*3/uL — ABNORMAL HIGH (ref 4.0–10.5)
WBC: 32.1 10*3/uL — ABNORMAL HIGH (ref 4.0–10.5)
WBC: 31.5 10*3/uL — ABNORMAL HIGH (ref 4.0–10.5)
WBC: 31.3 10*3/uL — ABNORMAL HIGH (ref 4.0–10.5)
WBC: 30.2 10*3/uL — ABNORMAL HIGH (ref 4.0–10.5)
WBC: 29.1 10*3/uL — ABNORMAL HIGH (ref 4.0–10.5)
WBC: 31.1 10*3/uL — ABNORMAL HIGH (ref 4.0–10.5)
nRBC: 9.6 % — ABNORMAL HIGH (ref 0.0–0.2)
nRBC: 10.1 % — ABNORMAL HIGH (ref 0.0–0.2)
nRBC: 8.8 % — ABNORMAL HIGH (ref 0.0–0.2)
nRBC: 8.8 % — ABNORMAL HIGH (ref 0.0–0.2)
nRBC: 9.3 % — ABNORMAL HIGH (ref 0.0–0.2)
nRBC: 9.3 % — ABNORMAL HIGH (ref 0.0–0.2)
nRBC: 10.4 % — ABNORMAL HIGH (ref 0.0–0.2)

## 2019-01-28 LAB — PHOSPHORUS
Phosphorus: 2.7 mg/dL (ref 2.5–4.6)
Phosphorus: 2.7 mg/dL (ref 2.5–4.6)

## 2019-01-28 LAB — APTT: aPTT: 126 seconds — ABNORMAL HIGH (ref 24–36)

## 2019-01-28 LAB — LACTIC ACID, PLASMA: Lactic Acid, Venous: 2 mmol/L (ref 0.5–1.9)

## 2019-01-28 LAB — GLUCOSE, CAPILLARY
Glucose-Capillary: 141 mg/dL — ABNORMAL HIGH (ref 70–99)
Glucose-Capillary: 142 mg/dL — ABNORMAL HIGH (ref 70–99)
Glucose-Capillary: 148 mg/dL — ABNORMAL HIGH (ref 70–99)
Glucose-Capillary: 150 mg/dL — ABNORMAL HIGH (ref 70–99)
Glucose-Capillary: 153 mg/dL — ABNORMAL HIGH (ref 70–99)
Glucose-Capillary: 155 mg/dL — ABNORMAL HIGH (ref 70–99)
Glucose-Capillary: 161 mg/dL — ABNORMAL HIGH (ref 70–99)
Glucose-Capillary: 162 mg/dL — ABNORMAL HIGH (ref 70–99)
Glucose-Capillary: 164 mg/dL — ABNORMAL HIGH (ref 70–99)
Glucose-Capillary: 164 mg/dL — ABNORMAL HIGH (ref 70–99)
Glucose-Capillary: 164 mg/dL — ABNORMAL HIGH (ref 70–99)
Glucose-Capillary: 174 mg/dL — ABNORMAL HIGH (ref 70–99)
Glucose-Capillary: 175 mg/dL — ABNORMAL HIGH (ref 70–99)
Glucose-Capillary: 177 mg/dL — ABNORMAL HIGH (ref 70–99)
Glucose-Capillary: 179 mg/dL — ABNORMAL HIGH (ref 70–99)
Glucose-Capillary: 189 mg/dL — ABNORMAL HIGH (ref 70–99)
Glucose-Capillary: 190 mg/dL — ABNORMAL HIGH (ref 70–99)
Glucose-Capillary: 196 mg/dL — ABNORMAL HIGH (ref 70–99)

## 2019-01-28 LAB — PROTIME-INR
INR: 1.4 — ABNORMAL HIGH (ref 0.8–1.2)
Prothrombin Time: 16.6 seconds — ABNORMAL HIGH (ref 11.4–15.2)

## 2019-01-28 LAB — HEPARIN LEVEL (UNFRACTIONATED)
Heparin Unfractionated: 0.36 IU/mL (ref 0.30–0.70)
Heparin Unfractionated: 0.27 IU/mL — ABNORMAL LOW (ref 0.30–0.70)

## 2019-01-28 LAB — COOXEMETRY PANEL
Carboxyhemoglobin: 1.9 % — ABNORMAL HIGH (ref 0.5–1.5)
Methemoglobin: 1.9 % — ABNORMAL HIGH (ref 0.0–1.5)
O2 Saturation: 76.6 %
Total hemoglobin: 7 g/dL — ABNORMAL LOW (ref 12.0–16.0)

## 2019-01-28 LAB — ECHOCARDIOGRAM LIMITED
Height: 69 in
Weight: 5410.97 oz

## 2019-01-28 LAB — CULTURE, RESPIRATORY W GRAM STAIN
Culture: NO GROWTH
Special Requests: NORMAL

## 2019-01-28 LAB — BPAM PLATELET PHERESIS
Blood Product Expiration Date: 202012132359
ISSUE DATE / TIME: 202012111815
Unit Type and Rh: 6200

## 2019-01-28 LAB — VANCOMYCIN, PEAK: Vancomycin Pk: 32 ug/mL (ref 30–40)

## 2019-01-28 LAB — CULTURE, BLOOD (ROUTINE X 2)
Culture: NO GROWTH
Special Requests: ADEQUATE

## 2019-01-28 LAB — PREPARE PLATELET PHERESIS: Unit division: 0

## 2019-01-28 LAB — MAGNESIUM
Magnesium: 2.3 mg/dL (ref 1.7–2.4)
Magnesium: 2.3 mg/dL (ref 1.7–2.4)

## 2019-01-28 LAB — PREPARE RBC (CROSSMATCH)

## 2019-01-28 LAB — CALCIUM, IONIZED: Calcium, Ionized, Serum: 3.5 mg/dL — ABNORMAL LOW (ref 4.5–5.6)

## 2019-01-28 LAB — LACTATE DEHYDROGENASE
LDH: 370 U/L — ABNORMAL HIGH (ref 98–192)
LDH: 395 U/L — ABNORMAL HIGH (ref 98–192)

## 2019-01-28 MED ORDER — SODIUM CHLORIDE 0.9% IV SOLUTION
Freq: Once | INTRAVENOUS | Status: AC
  Administered 2019-01-28: 12:00:00 via INTRAVENOUS

## 2019-01-28 MED ORDER — ALBUMIN HUMAN 25 % IV SOLN
12.5000 g | Freq: Four times a day (QID) | INTRAVENOUS | Status: AC
  Administered 2019-01-28 – 2019-01-29 (×4): 12.5 g via INTRAVENOUS
  Filled 2019-01-28 (×5): qty 50

## 2019-01-28 MED ORDER — CALCIUM GLUCONATE-NACL 2-0.675 GM/100ML-% IV SOLN
2.0000 g | Freq: Once | INTRAVENOUS | Status: AC
  Administered 2019-01-28: 2000 mg via INTRAVENOUS
  Filled 2019-01-28: qty 100

## 2019-01-28 MED ORDER — SODIUM CHLORIDE 0.9% IV SOLUTION
Freq: Once | INTRAVENOUS | Status: AC
  Administered 2019-01-28: 15:00:00 via INTRAVENOUS

## 2019-01-28 MED ORDER — SODIUM CHLORIDE 0.9% IV SOLUTION
Freq: Once | INTRAVENOUS | Status: AC
  Administered 2019-01-28: 05:00:00 via INTRAVENOUS

## 2019-01-28 MED ORDER — SODIUM CHLORIDE 0.9% IV SOLUTION
Freq: Once | INTRAVENOUS | Status: AC
  Administered 2019-01-28: 22:00:00 via INTRAVENOUS

## 2019-01-28 MED ORDER — PANTOPRAZOLE SODIUM 40 MG IV SOLR
40.0000 mg | Freq: Two times a day (BID) | INTRAVENOUS | Status: DC
  Administered 2019-01-28 – 2019-02-02 (×10): 40 mg via INTRAVENOUS
  Filled 2019-01-28 (×10): qty 40

## 2019-01-28 MED ORDER — SODIUM CHLORIDE 0.9 % IV SOLN
INTRAVENOUS | Status: DC | PRN
  Administered 2019-01-28: 500 mL via INTRAVENOUS

## 2019-01-28 MED ORDER — MIDAZOLAM 50MG/50ML (1MG/ML) PREMIX INFUSION: 2.0000 mg/h | INTRAVENOUS | Status: DC

## 2019-01-28 MED ORDER — SODIUM CHLORIDE 0.9 % IV SOLN
INTRAVENOUS | Status: DC
  Administered 2019-01-28 (×2): via INTRAVENOUS
  Filled 2019-01-28 (×2): qty 250

## 2019-01-28 NOTE — Progress Notes (Signed)
Mannam MD notified of critical calcium 6.4. Orders for 2 g calcium gluconate IV.

## 2019-01-28 NOTE — Progress Notes (Signed)
RT NOTE: RT weaned nitric per MD order to go down by 5ppm per hour until nitric is at 20ppm. RN at bedside and aware. RT will continue to monitor.

## 2019-01-28 NOTE — Progress Notes (Signed)
4 Days Post-Op Procedure(s) (LRB): CANNULATION FOR VA ECMO (EXTRACORPOREAL MEMBRANE OXYGENATION) (N/A) TRANSESOPHAGEAL ECHOCARDIOGRAM (TEE) (N/A) Subjective:  Hemodynamics better but persistent anemia despite PRBC transfusions- no clinical evidence for bleeding  Objective: Vital signs in last 24 hours: Temp:  [97.5 F (36.4 C)-98.1 F (36.7 C)] 97.5 F (36.4 C) (12/12 0821) Pulse Rate:  [70-91] 79 (12/12 0821) Cardiac Rhythm: Normal sinus rhythm (12/12 0800) Resp:  [0-29] 15 (12/12 0821) BP: (95-115)/(67-74) 99/67 (12/12 0821) SpO2:  [90 %-100 %] 100 % (12/12 0821) Arterial Line BP: (68-123)/(57-83) 110/70 (12/12 0815) FiO2 (%):  [30 %-40 %] 30 % (12/12 0821) Weight:  [153.4 kg] 153.4 kg (12/12 0600)  Hemodynamic parameters for last 24 hours: CVP:  [1 mmHg] 1 mmHg  Intake/Output from previous day: 12/11 0701 - 12/12 0700 In: 6940 [I.V.:3632.8; LZJQB:3419; NG/GT:327.7; IV Piggyback:1431.5] Out: 3790 [Urine:3610] Intake/Output this shift: Total I/O In: -  Out: 350 [Urine:350]  Sedated on vent Better perfusion of extremities Edema worse Cannula sites clean and dry Lab Results: Recent Labs    01/28/19 0051 01/28/19 0416 01/28/19 0428  WBC 32.2*  --  32.1*  HGB 7.7* 6.5* 7.0*  HCT 22.0* 19.0* 20.6*  PLT 67*  --  65*   BMET:  Recent Labs    01/27/19 1545 01/28/19 0416 01/28/19 0428  NA 141 140 142  K 4.0 3.7 3.8  CL 105  --  107  CO2 27  --  26  GLUCOSE 164*  --  171*  BUN 45*  --  50*  CREATININE 1.44*  --  1.70*  CALCIUM 6.4*  --  6.2*    PT/INR:  Recent Labs    01/28/19 0428  LABPROT 16.6*  INR 1.4*   ABG    Component Value Date/Time   PHART 7.502 (H) 01/28/2019 0416   HCO3 29.8 (H) 01/28/2019 0416   TCO2 31 01/28/2019 0416   ACIDBASEDEF 3.0 (H) 01/26/2019 0555   O2SAT 76.6 01/28/2019 0600   CBG (last 3)  Recent Labs    01/28/19 0413 01/28/19 0535 01/28/19 0703  GLUCAP 155* 148* 150*    Assessment/Plan: S/P Procedure(s)  (LRB): CANNULATION FOR VA ECMO (EXTRACORPOREAL MEMBRANE OXYGENATION) (N/A) TRANSESOPHAGEAL ECHOCARDIOGRAM (TEE) (N/A) Increase lasix drip OG tube barely in stomch- place on suction today and hold TF for now- may have GI bleeding Start iv protonix bid  LOS: 5 days    Philip Wolfe 01/28/2019

## 2019-01-28 NOTE — Progress Notes (Signed)
McLean MD notified of critical hemoglobin 5.5 on iSTAT ABG. CBC also sent at same time. Orders for 1 u pRBC if hemoglobin less than 8 or 2 u pRBCs if hemoglobin less than 7 per CBC.

## 2019-01-28 NOTE — Progress Notes (Signed)
eLink Physician-Brief Progress Note Patient Name: Philip Wolfe DOB: 1951-01-18 MRN: [id number]   Date of Service  01/28/2019  HPI/Events of Note  20 M massive PE s/p systemic and catheter directed thrombolysis now on VA ECMO. Pump speed 4.5, sweep 5. Epi 4, Levo 24,, vaso 0.04. Bedside RN reports titrating down pressors. Sedated on Versed 10 and Fentanyl 375.  eICU Interventions  Continue current therapies.  Wean pressors and ECMO support as tolerated     Intervention Category Evaluation Type: [name] T Walter Grima 01/28/2019, 2:39 AM

## 2019-01-28 NOTE — Progress Notes (Signed)
NAME:  Philip Wolfe, MRN:  [id number], DOB:  [birth year], LOS: 5 ADMISSION DATE:  01/18/2019, CONSULTATION DATE:  01/29/2019 REFERRING MD:  Dr. Ralene Bathe EDP, CHIEF COMPLAINT:  Hypoxia   Brief History   68 year old male admitted 12/7 with massive PE s/p thrombolytics and catheter directed lysis.  Has cardiac arrest, significant RV failure requiring initiation of ECMO  Past Medical History   has a past medical history of Arthritis, Hypertension, Sleep apnea, Ulcer (left medial ankle), and Venous stasis ulcer ([place]).   Significant Hospital Events   12/7-Admit, intubated coded, given TPA 800 mg and then 50 mg 6 hours later 12/8- Cannulated for VA ECMO 02/05/2019 and underwent EKOS directed catheter placement.  Inhaled NO started 12/9- Significant epistaxis requiring 3 units PRBC and nasal packing by ENT, bronchoscopy with no evidence of pulmonary bleed 12/10- Second round of EKOS directed catheter TPA, nasal packing replaced by [facility] catheter by ENT 12/11-reviewed echo with cardiology.  RV function is improving.  Held off further lytics/thrombectomy  Consults:  PCCM, cardiology, cardiothoracic surgery  Procedures:  R axillary arterial catheter, R femoral venous catheter, RIJ introducer, LIJ MML, L radial artery  Significant Diagnostic Tests:  CTA 12/7-massive bilateral central pulmonary emboli with significant decrease in pulmonary artery flow, patchy groundglass infiltrate, rib fractures on the right.  CT head 12/7-generalized atrophy, chronic ischemic microangiopathy.  Echo 12/9-severe reduction in RV systolic function with RV volume and pressure overload.  LVEF 50-55%  Micro Data:  Blood cultures 12/7-negative Blood culture 12/8-negative MRSA PCR 12/8-negative Respiratory culture 12/10-negative  Antimicrobials:  Cefepime 12/10 > Vanco 12/9 >>  Interim history/subjective:  Remains on ECMO.  Pressors weaning down-off epinephrine.  Continues on Levophed and vasopressin Off paralytics   Objective   Blood pressure 105/71, pulse 85, temperature 97.7 F (36.5 C), resp. rate (!) 7, height 5' 9.02" (1.753 m), weight (!) 150.2 kg, SpO2 100 %. CVP:  [1 mmHg-8 mmHg] 1 mmHg  Vent Mode: PCV FiO2 (%):  [40 %] 40 % Set Rate:  [10 bmp-20 bmp] 20 bmp Vt Set:  [420 mL] 420 mL PEEP:  [5 cmH20] 5 cmH20 Plateau Pressure:  [12 cmH20-16 cmH20] 13 cmH20   Intake/Output Summary (Last 24 hours) at 01/28/2019 0615 Last data filed at 01/28/2019 0500 Gross per 24 hour  Intake 6623.44 ml  Output 3435 ml  Net 3188.44 ml   Filed Weights   01/25/19 0500 01/26/19 0344 01/27/19 0600  Weight: (!) 148.2 kg (!) 152.2 kg (!) 150.2 kg   Examination: Gen:      No acute distress HEENT:  EOMI, sclera anicteric Neck:     No masses; no thyromegaly, ETT Lungs:    Clear to auscultation bilaterally; normal respiratory effort CV:         Regular rate and rhythm; no murmurs Abd:      + bowel sounds; soft, non-tender; no palpable masses, no distension Ext:    1-2+  edema; adequate peripheral perfusion Skin:      Warm and dry; no rash Neuro: Sedated, unresponsive  Resolved Hospital Problem list     Assessment & Plan:  Acute cor pulmonale due to massive PE-  S/p TPA x 2 and EKOS X 2 On VA ECMO axillary-femoral cannulation. Holding off embolectomy at this point Wean pressors as tolerated Continue pressors, heparin drip  Acute hypoxic, hypercarbic respiratory failure Continue pressure control ventilation ABG reviewed with mild alkalosis.  Respiratory rate reduced to 12 Continue empiric antibiotic for leukocytosis  Epistaxis S/p packing.  Continue monitoring CBC, transfuse as needed  Hypoglycemia Transition to subcu insulin  Labs/imaging personally reviewed.  Significant for  BUN 45, creatinine 1.44, calcium 6.4, AST 42 WBC 32, hemoglobin 7, platelets 65  Chest x-ray 12/11-stable lines and tubes.  Lower lobe consolidation Abdominal x-ray 12/11-gastric tube in the stomach  Best practice:   Diet: Tube feeds Pain/Anxiety/Delirium protocol (if indicated): Precedex, Versed, fentanyl VAP protocol (if indicated): Ordered DVT prophylaxis: Heparin Drip GI prophylaxis: Pepcid Glucose control: Insulin drip Mobility: Bed Code Status: Full Family Communication: Updated at bedside Disposition: ICU  Critical care time:     The patient is critically ill with multiple organ system failure and requires high complexity decision making for assessment and support, frequent evaluation and titration of therapies, advanced monitoring, review of radiographic studies and interpretation of complex data.   Critical Care Time devoted to patient care services, exclusive of separately billable procedures, described in this note is 35 minutes.   Chilton Greathouse MD [facility] 01/28/2019, 6:38 AM

## 2019-01-28 NOTE — Consult Note (Signed)
Palliative Medicine   Name: Philip Wolfe Date: 01/28/2019 MRN: [id number]=-MBT: 10/23/1950  Patient Care Team: Derinda Late, MD as PCP - General (Family Medicine)    REASON FOR CONSULTATION: Philip Wolfe is a 68 y.o. male with multiple medical problems including morbid obesity, who was admitted to the hospital 01/7019 with hypoxic respiratory failure and found to have massive bilateral PE.  Patient subsequently suffered PEA arrest in the ER and was intubated. He was found to have cardiogenic shock with RV failure. Patient is s/p TPA. He was started on [place] ECMO on 12/8.  Palliative care was consulted to help address goals.   SOCIAL HISTORY:     reports that he has never smoked. He has never used smokeless tobacco. He reports that he does not drink alcohol or use drugs.   Patient is married and lives at home with his wife and son.  He also has a daughter who lives in the [place] area.  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  ADVANCE DIRECTIVES:  Patient has a living will does not currently on file.  CODE STATUS: Full code  PAST MEDICAL HISTORY: Past Medical History:  Diagnosis Date  . Arthritis   . Hypertension   . Sleep apnea   . Ulcer left medial ankle  . Venous stasis ulcer ([place])     PAST SURGICAL HISTORY:  Past Surgical History:  Procedure Laterality Date  . CANNULATION FOR ECMO (EXTRACORPOREAL MEMBRANE OXYGENATION) N/A 02/05/2019   Procedure: CANNULATION FOR VA ECMO (EXTRACORPOREAL MEMBRANE OXYGENATION);  Surgeon: Prescott Gum, Collier Salina, MD;  Location: [facility];  Service: Open Heart Surgery;  Laterality: N/A;  C-ARM  . ENDOVENOUS ABLATION SAPHENOUS VEIN W/ LASER  11-05-2010  Left Greater Saphenous Vein  . GASTRIC BYPASS    . IR ANGIOGRAM PULMONARY BILATERAL SELECTIVE  01/29/2019  . IR ANGIOGRAM PULMONARY BILATERAL SELECTIVE  01/26/2019  . IR ANGIOGRAM SELECTIVE EACH ADDITIONAL VESSEL  02/16/2019  . IR ANGIOGRAM SELECTIVE EACH ADDITIONAL VESSEL  02/09/2019   . IR ANGIOGRAM SELECTIVE EACH ADDITIONAL VESSEL  01/26/2019  . IR ANGIOGRAM SELECTIVE EACH ADDITIONAL VESSEL  01/26/2019  . IR INFUSION THROMBOL ARTERIAL INITIAL (MS)  02/14/2019  . IR INFUSION THROMBOL ARTERIAL INITIAL (MS)  02/11/2019  . IR INFUSION THROMBOL ARTERIAL INITIAL (MS)  01/26/2019  . IR INFUSION THROMBOL ARTERIAL INITIAL (MS)  01/26/2019  . IR THROMB F/U EVAL ART/VEN FINAL DAY (MS)  01/25/2019  . IR THROMB F/U EVAL ART/VEN FINAL DAY (MS)  01/27/2019  . IR US GUIDE VASC ACCESS RIGHT  01/26/2019  . TEE WITHOUT CARDIOVERSION N/A 02/11/2019   Procedure: TRANSESOPHAGEAL ECHOCARDIOGRAM (TEE);  Surgeon: Prescott Gum, Collier Salina, MD;  Location: [facility];  Service: Open Heart Surgery;  Laterality: N/A;  . varicose vein strippin g      HEMATOLOGY/ONCOLOGY HISTORY:  Oncology History   No history exists.    ALLERGIES:  has No Known Allergies.  MEDICATIONS:  Current Facility-Administered Medications  Medication Dose Route Frequency Provider Last Rate Last Admin  . 0.9 %  sodium chloride infusion (Manually program via Guardrails IV Fluids)   Intravenous Once Prescott Gum, Collier Salina, MD      . 0.9 %  sodium chloride infusion   Intravenous PRN Larey Dresser, MD 10 mL/hr at 01/28/19 1700 Rate Verify at 01/28/19 1700  . acetaminophen (TYLENOL) tablet 1,000 mg  1,000 mg Oral Q6H Ivin Poot, MD       Or  . acetaminophen (TYLENOL)  160 MG/5ML solution 1,000 mg  1,000 mg Per Tube Q6H Prescott Gum, Collier Salina, MD   1,000 mg at 01/28/19 1715  . albumin human 25 % solution 12.5 g  12.5 g Intravenous Q6H Ivin Poot, MD 60 mL/hr at 01/28/19 1404 12.5 g at 01/28/19 1404  . bisacodyl (DULCOLAX) EC tablet 10 mg  10 mg Oral Daily Prescott Gum, Collier Salina, MD   10 mg at 01/28/19 1478   Or  . bisacodyl (DULCOLAX) suppository 10 mg  10 mg Rectal Daily Prescott Gum, Collier Salina, MD   10 mg at 01/27/19 0955  . bisacodyl (DULCOLAX) suppository 10 mg  10 mg Rectal Daily PRN Prescott Gum, Collier Salina, MD      . calcium chloride injection 1 g  1 g  Intravenous Once PRN Prescott Gum, Collier Salina, MD      . ceFEPIme (MAXIPIME) 2 g in sodium chloride 0.9 % 100 mL IVPB  2 g Intravenous Q8H Larey Dresser, MD 200 mL/hr at 01/28/19 1715 2 g at 01/28/19 1715  . chlorhexidine gluconate (MEDLINE KIT) (PERIDEX) 0.12 % solution 15 mL  15 mL Mouth Rinse BID Prescott Gum, Collier Salina, MD   15 mL at 01/28/19 0800  . Chlorhexidine Gluconate Cloth 2 % PADS 6 each  6 each Topical Daily Ivin Poot, MD   6 each at 01/28/19 0545  . dexmedetomidine (PRECEDEX) 400 MCG/100ML (4 mcg/mL) infusion  0.4-1.2 mcg/kg/hr Intravenous Continuous Ivin Poot, MD   Stopped at 01/26/19 1143  . dextrose 50 % solution 0-50 mL  0-50 mL Intravenous PRN Prescott Gum, Collier Salina, MD      . docusate sodium (COLACE) capsule 200 mg  200 mg Oral Daily Prescott Gum, Collier Salina, MD      . EPINEPHrine (ADRENALIN) 8 mg in dextrose 5 % 250 mL (0.032 mg/mL) infusion  0.5-50 mcg/min Intravenous Titrated Candee Furbish, MD   Stopped at 01/28/19 [phone]  . feeding supplement (PRO-STAT SUGAR FREE 64) liquid 30 mL  30 mL Per Tube TID Kipp Brood, MD   30 mL at 01/28/19 1520  . feeding supplement (VITAL 1.5 CAL) liquid 1,000 mL  1,000 mL Per Tube Continuous Kipp Brood, MD   Stopped at 01/28/19 0830  . fentaNYL (SUBLIMAZE) bolus via infusion 25 mcg  25 mcg Intravenous Q15 min PRN Ivin Poot, MD   25 mcg at 01/27/19 2332  . fentaNYL (SUBLIMAZE) bolus via infusion 50 mcg  50 mcg Intravenous Q15 min PRN Larey Dresser, MD      . fentaNYL 2570mg in NS 2542m(1024mml) infusion-PREMIX  50-400 mcg/hr Intravenous Continuous VanPrescott GumetCollier SalinaD 37.5 mL/hr at 01/28/19 1700 375 mcg/hr at 01/28/19 1700  . furosemide (LASIX) 250 mg in dextrose 5 % 250 mL (1 mg/mL) infusion  8 mg/hr Intravenous Continuous McLLarey DresserD 8 mL/hr at 01/28/19 1700 8 mg/hr at 01/28/19 1700  . heparin ADULT infusion 100 units/mL (25000 units/250m43mdium chloride 0.45%)  1,400 Units/hr Intravenous Continuous McCaRonna PolioH 14  mL/hr at 01/28/19 1700 1,400 Units/hr at 01/28/19 1700  . insulin regular, human (MYXREDLIN) 100 units/ 100 mL infusion   Intravenous Continuous Van Prescott GumteCollier Salina 3.2 mL/hr at 01/28/19 1700 Rate Verify at 01/28/19 1700  . lactated ringers infusion   Intravenous Continuous Van Ivin Poot 10 mL/hr at 01/25/19 1031 New Bag at 01/25/19 1031  . lactated ringers infusion   Intravenous Continuous Van Ivin Poot   Stopped at 01/22/2019 2159  . MEDLINE mouth rinse  15 mL Mouth Rinse 10 times per day Prescott Gum, Collier Salina, MD   15 mL at 01/28/19 1600  . metoCLOPramide (REGLAN) injection 10 mg  10 mg Intravenous Q6H Skeet Simmer, RPH   10 mg at 01/28/19 1715  . metoprolol tartrate (LOPRESSOR) injection 2.5-5 mg  2.5-5 mg Intravenous Q2H PRN Prescott Gum, Collier Salina, MD      . midazolam (VERSED) 250 mg in sodium chloride 0.9% 250 mL infusion   Intravenous Continuous Mannam, Praveen, MD 3 mL/hr at 01/28/19 1709 New Bag at 01/28/19 1709  . midazolam (VERSED) bolus via infusion 1-2 mg  1-2 mg Intravenous Q2H PRN Larey Dresser, MD      . midazolam (VERSED) injection 2 mg  2 mg Intravenous Q1H PRN Ivin Poot, MD   2 mg at 02/07/2019 2200  . morphine 2 MG/ML injection 1-4 mg  1-4 mg Intravenous Q1H PRN Prescott Gum, Collier Salina, MD      . norepinephrine (LEVOPHED) 16 mg in 229m premix infusion  0-50 mcg/min Intravenous Titrated AWonda Olds MD 22.5 mL/hr at 01/28/19 1700 24 mcg/min at 01/28/19 1700  . ondansetron (ZOFRAN) injection 4 mg  4 mg Intravenous Q6H PRN VPrescott Gum PCollier Salina MD      . oxyCODONE (Oxy IR/ROXICODONE) immediate release tablet 5-10 mg  5-10 mg Oral Q3H PRN VPrescott Gum PCollier Salina MD      . pantoprazole (PROTONIX) injection 40 mg  40 mg Intravenous Q12H VPrescott Gum PCollier Salina MD   40 mg at 01/28/19 0911  . sodium chloride flush (NS) 0.9 % injection 10-40 mL  10-40 mL Intracatheter Q12H Mannam, Praveen, MD   10 mL at 01/28/19 0912  . sodium chloride flush (NS) 0.9 % injection 10-40 mL  10-40 mL  Intracatheter PRN Mannam, Praveen, MD      . sodium chloride flush (NS) 0.9 % injection 3 mL  3 mL Intravenous Q12H VPrescott Gum PCollier Salina MD   3 mL at 01/28/19 0912  . sodium chloride flush (NS) 0.9 % injection 3 mL  3 mL Intravenous PRN VPrescott Gum PCollier Salina MD      . traMADol (Veatrice Bourbon tablet 50-100 mg  50-100 mg Oral Q4H PRN VPrescott Gum PCollier Salina MD      . vancomycin (VANCOCIN) IVPB 1000 mg/200 mL premix  1,000 mg Intravenous Q12H VIvin Poot MD   Stopped at 01/28/19 [phone] . vasopressin (PITRESSIN) 40 Units in sodium chloride 0.9 % 250 mL (0.16 Units/mL) infusion  0.01-0.04 Units/min Intravenous Continuous VPrescott Gum PCollier Salina MD 3.75 mL/hr at 01/28/19 1700 0.01 Units/min at 01/28/19 1700    VITAL SIGNS: BP 115/75   Pulse 82   Temp 97.7 F (36.5 C)   Resp (!) 5   Ht '5\' 9"'  (1.753 m)   Wt (!) 338 lb 3 oz (153.4 kg)   SpO2 100%   BMI 49.94 kg/m  Filed Weights   01/26/19 0344 01/27/19 0600 01/28/19 0600  Weight: (!) 335 lb 8.6 oz (152.2 kg) (!) 331 lb 2.1 oz (150.2 kg) (!) 338 lb 3 oz (153.4 kg)    Estimated body mass index is 49.94 kg/m as calculated from the following:   Height as of this encounter: '5\' 9"'  (1.753 m).   Weight as of this encounter: 338 lb 3 oz (153.4 kg).  LABS: CBC:    Component Value Date/Time   WBC 31.3 (H) 01/28/2019 1059   HGB 7.1 (L) 01/28/2019 1059   HCT 21.0 (L) 01/28/2019 1059   PLT 59 (L) 01/28/2019 1059  MCV 91.7 01/28/2019 1059   NEUTROABS 10.6 (H) 02/11/2019 1504   LYMPHSABS 3.0 01/28/2019 1504   MONOABS 1.5 (H) 01/28/2019 1504   EOSABS 0.3 01/19/2019 1504   BASOSABS 0.1 01/26/2019 1504   Comprehensive Metabolic Panel:    Component Value Date/Time   NA 142 01/28/2019 0428   K 3.8 01/28/2019 0428   CL 107 01/28/2019 0428   CO2 26 01/28/2019 0428   BUN 50 (H) 01/28/2019 0428   CREATININE 1.70 (H) 01/28/2019 0428   GLUCOSE 171 (H) 01/28/2019 0428   CALCIUM 6.2 (LL) 01/28/2019 0428   AST 42 (H) 01/28/2019 0428   ALT 22 01/28/2019 0428   ALKPHOS  43 01/28/2019 0428   BILITOT 0.9 01/28/2019 0428   PROT 3.3 (L) 01/28/2019 0428   ALBUMIN 1.6 (L) 01/28/2019 0428    RADIOGRAPHIC STUDIES: DG Abd 1 View  Result Date: 01/27/2019 CLINICAL DATA:  Nasogastric tube placement EXAM: ABDOMEN - 1 VIEW COMPARISON:  Chest radiograph of earlier today FINDINGS: Single supine view of the left upper quadrant. Left pleural fluid and base airspace disease are suboptimally evaluated. Nasogastric tube is incompletely imaged, but terminates over the gastric body. Status post lap band procedure. IMPRESSION: Nasogastric terminating over the body of the stomach. Limited left upper quadrant radiograph. Electronically Signed   By: Abigail Miyamoto M.D.   On: 01/27/2019 15:19   CT HEAD WO CONTRAST  Result Date: 01/29/2019 CLINICAL DATA:  Encephalopathy EXAM: CT HEAD WITHOUT CONTRAST TECHNIQUE: Contiguous axial images were obtained from the base of the skull through the vertex without intravenous contrast. COMPARISON:  None. FINDINGS: Brain: There is no mass, hemorrhage or extra-axial collection. There is generalized atrophy without lobar predilection. Hypodensity of the white matter is most commonly associated with chronic microvascular disease. Vascular: No abnormal hyperdensity of the major intracranial arteries or dural venous sinuses. No intracranial atherosclerosis. Skull: Fluid levels in the frontal and maxillary sinuses. Moderate ethmoid sinus opacification. Sinuses/Orbits: No fluid levels or advanced mucosal thickening of the visualized paranasal sinuses. No mastoid or middle ear effusion. The orbits are normal. 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 Electronically Signed   By: Ulyses Jarred M.D.   On: 01/20/2019 23:32   CT ANGIO CHEST PE W OR WO CONTRAST  Result Date: 01/26/2019 CLINICAL DATA:  Known recently diagnosed pulmonary embolism on anticoagulant treatment. Follow-up. EXAM: CT ANGIOGRAPHY  CHEST WITH CONTRAST TECHNIQUE: Multidetector CT imaging of the chest was performed using the standard protocol during bolus administration of intravenous contrast. Multiplanar CT image reconstructions and MIPs were obtained to evaluate the vascular anatomy. CONTRAST:  50m OMNIPAQUE IOHEXOL 350 MG/ML SOLN COMPARISON:  01/22/2019 FINDINGS: Cardiovascular: Bilateral IJ venous lines present. Mild cardiomegaly. Mild calcified plaque over the 3 vessel coronary arteries. Ectasia of the ascending thoracic aorta measuring 3.5 cm unchanged. Pulmonary arterial system demonstrates evidence of patient's bilateral pulmonary emboli with slight interval over the left lower lobar arteries. Also improvement over the right main pulmonary artery. Persistent moderate burden of emboli over the proximal right upper, middle and lower lobar pulmonary arteries with minimal interval improvement. Evidence of persistent right heart strain (RV/LV is 34.31/27.10 =1.26). Mediastinum/Nodes: No significant mediastinal or hilar adenopathy. Lungs/Pleura: Endotracheal tube has tip approximately 1.4 cm above the carina. Interval development of small bilateral pleural effusions with associated moderate bibasilar compressive atelectasis. Persistent hazy opacification over the left perihilar region with slight interval improvement. New mild patchy peripheral opacification over the anterior right upper lobe. Complete opacification over the proximal  lobar bronchi with complete obstruction of the right lower lobe bronchus. Upper Abdomen: Catheter over the IVC extending through the right heart with tip over the SVC just above the superior cavoatrial junction. 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 Mild cholelithiasis. Musculoskeletal: Degenerative change of the spine. Review of the MIP images confirms the above findings. IMPRESSION: 1. Evidence of patient's known moderate bilateral  pulmonary emboli right worse than left with mild overall improvement as described. Evidence of persistent right heart strain. 2. New small bilateral pleural effusions with associated moderate compressive atelectasis in the lung bases. Opacification of the proximal right bronchi with obstruction of the right lower lobe bronchus. 3. Slight interval improvement of hazy airspace opacification over the left perihilar region which may be due to asymmetric edema or infection. New mild peripheral opacification over the anterior right upper lobe which may be due to atelectasis or infection. 4.  Tubes and lines as described. 5. Stable ectasia of the ascending thoracic aorta measuring 3.5 cm in AP diameter. Recommend annual imaging followup by CTA or MRA. This recommendation follows 2010 ACCF/AHA/AATS/ACR/ASA/SCA/SCAI/SIR/STS/SVM Guidelines for the Diagnosis and Management of Patients with Thoracic Aortic Disease. Circulation.2010; 121: W389-H734. Aortic aneurysm NOS (ICD10-I71.9). 6.  Atherosclerotic coronary artery disease. 7.  Cholelithiasis. Electronically Signed   By: Marin Olp M.D.   On: 01/26/2019 10:40   CT Angio Chest PE W/Cm &/Or Wo Cm  Result Date: 01/28/2019 CLINICAL DATA:  Shortness of breath and recent CPR EXAM: CT ANGIOGRAPHY CHEST WITH CONTRAST TECHNIQUE: Multidetector CT imaging of the chest was performed using the standard protocol during bolus administration of intravenous contrast. Multiplanar CT image reconstructions and MIPs were obtained to evaluate the vascular anatomy. CONTRAST:  13m OMNIPAQUE IOHEXOL 350 MG/ML SOLN COMPARISON:  Chest x-ray from earlier in the same day. FINDINGS: Cardiovascular: Thoracic aorta demonstrates mild atherosclerotic calcifications. No aneurysmal dilatation or dissection is seen. Pulmonary artery is well visualized within normal branching pattern. Large bilateral pulmonary emboli are identified with near complete loss of pulmonary arterial flow on the right and  significant decrease on the left. Changes consistent with right heart strain are noted. Mediastinum/Nodes: Thoracic inlet is within normal limits. No hilar or mediastinal adenopathy is noted. The esophagus as visualized is within normal limits. Endotracheal tube is noted in satisfactory position. Lungs/Pleura: Lungs are well aerated bilaterally. Patchy ground-glass infiltrate is noted in the left upper lobe in a perihilar distribution. No other focal infiltrate is seen. No sizable effusion or pneumothorax is noted. Upper Abdomen: Visualized upper abdomen shows gastric lap band in satisfactory position. No other focal abnormality is noted. Musculoskeletal: Degenerative changes of the thoracic spine are seen. Some undisplaced rib fractures are noted anteriorly involving the fourth, fifth and sixth ribs on the right. Review of the MIP images confirms the above findings. IMPRESSION: Massive bilateral central pulmonary emboli with significant decrease in pulmonary arterial flow. Right heart strain is noted. Patchy ground-glass infiltrate in the left upper lobe. Patient has negative COVID-19 status and this may represent acute pneumonic infiltrate or sequelae from the known pulmonary emboli. Undisplaced rib fractures on the right as described without pneumothorax. No other focal abnormality is noted. Aortic Atherosclerosis (ICD10-I70.0). Critical Value/emergent results were called by telephone at the time of interpretation on 01/29/2019 at 11:33 pm to Dr. RRalene Bathe who verbally acknowledged these results. Electronically Signed   By: MInez CatalinaM.D.   On: 02/05/2019 23:41   IR Angiogram Pulmonary Bilateral Selective  Result Date:  01/26/2019 INDICATION: 68 year old with history of massive pulmonary emboli and cardiogenic shock. Patient has already undergone systemic tPA and previous 12 hour catheter directed pulmonary artery thrombolysis. Patient is currently on ECMO and continues to have a large clot burden in the  pulmonary arteries. EXAM: BILATERAL PULMONARY ARTERIOGRAMS; SELECTION OF BILATERAL PULMONARY ARTERIES PLACEMENT OF BILATERAL PULMONARY ARTERY INFUSION CATHETERS ULTRASOUND GUIDANCE FOR VASCULAR ACCESS COMPARISON:  01/27/2019 MEDICATIONS: None. ANESTHESIA/SEDATION: Intubated and on ECMO. FLUOROSCOPY TIME:  Fluoroscopy Time: 23 minutes, 30 seconds, 4034 mGy COMPLICATIONS: None TECHNIQUE: Informed consent was obtained from the patient's wife. Maximal Sterile Barrier Technique was utilized including caps, mask, sterile gowns, sterile gloves, sterile drape, hand hygiene and skin antiseptic. A timeout was performed prior to the initiation of the procedure. Right neck was prepped and draped in sterile fashion. Patient already had a 6 French right jugular sheath in place. Ultrasound confirmed a patent right internal jugular vein. 21 gauge needle was directed into the right internal jugular vein and micropuncture dilator set was placed. A second 6 French vascular sheath was placed. The other 6 [place] vascular sheath was exchanged due to poor function. An angled 6 French pigtail catheter was advanced through the heart into the right pulmonary artery. Right pulmonary arteriogram was performed. A Rosen wire was placed. Pigtail catheter was placed through the other 6 French vascular sheath and advanced into the main pulmonary artery and left pulmonary artery. Catheter was exchanged for a Kumpe catheter with a Bentson wire. Kumpe catheter was advanced into left pulmonary arteries and selective left pulmonary arteriograms were performed. Catheter was advanced into a lower lobe branch and removed over a Rosen wire. A 90 cm, 10 cm infusion length UniFuse catheter was advanced over the wire and positioned in the left lower lobe branch. Attention was directed to placement of the right pulmonary artery catheter. Kumpe catheter was advanced into right pulmonary artery branches and selective angiography was performed. Eventually,  catheter was advanced into the right upper trunk that was completely occluded on the arteriogram. A 90 cm x 15 cm infusion length UniFuse catheter was placed into the right upper trunk. Both sheaths were secured to the skin with suture. FINDINGS: There continues to be a large amount of clot in the distal right pulmonary artery extending into the upper and lower trunks. Compared to the previous examination, there is now some flow into the right lower lobe branches. There continues to be no flow into the right upper lobe branches. Right pulmonary infusion catheter was placed within the right upper trunk. Segmental clot identified in the left pulmonary arteries. Left PA infusion catheter was placed within a left lower segmental branch. IMPRESSION: 1. Pulmonary arteriography demonstrates bilateral pulmonary emboli. Slightly improved flow to the right pulmonary arteries compared to the examination on 02/04/2019. There continues to be a large clot burden in the distal right pulmonary artery and no significant flow into the right upper lobe branches. 2. Successful placement of bilateral pulmonary artery infusion catheters. Initiation of catheter directed thrombolysis. Plan for 1 mg of tPA through in each catheter for total of 2 mg/hour. Plan for 12 hour treatment. Electronically Signed   By: Markus Daft M.D.   On: 01/26/2019 18:57   IR Angiogram Pulmonary Bilateral Selective  Result Date: 01/31/2019 INDICATION: Acute bilateral occlusive pulmonary emboli EXAM: Ultrasound guidance for vascular access Bilateral pulmonary artery catheterizations, angiograms, and insertion of infusion catheters for PE thrombolysis COMPARISON:  11/23/2018 MEDICATIONS: Patient is currently on ECMO ANESTHESIA/SEDATION: The patient was continuously monitored during  the procedure by the ECMO team FLUOROSCOPY TIME:  Fluoroscopy Time: 14 minutes 12 seconds (822 mGy). COMPLICATIONS: None immediate. TECHNIQUE: Informed written consent was obtained  from the patient's family after a thorough discussion of the procedural risks, benefits and alternatives. All questions were addressed. Maximal Sterile Barrier Technique was utilized including caps, mask, sterile gowns, sterile gloves, sterile drape, hand hygiene and skin antiseptic. A timeout was performed prior to the initiation of the procedure. Under sterile conditions, the existing 8 French sheath in the right internal jugular vein was exchanged over 2 guidewires. Two adjacent 6 French sheaths were inserted. Initially, a 6 French angle pigtail catheter was advanced through 1 of the 6 French sheaths into the right heart. This was advanced through the right heart outflow into the pulmonary arteries and initially position of the right pulmonary artery. Pulmonary angiogram performed. Initial pulmonary angiogram: Occlusive filling defects noted in the proximal right pulmonary artery. Nonocclusive filling defects in the left pulmonary artery. Over Rosen guidewire, a 10 cm infusion catheter was advanced into the right pulmonary artery extending into the lower lobe branches. Contrast injection confirms position. Inner occlusion wire advanced. Position confirmed with fluoroscopy. In a similar fashion through the second right IJ adjacent 6 French sheath, the angled 6 French pigtail catheter was advanced and positioned in the right heart initially. This was advanced through the right heart outflow into the main pulmonary artery. Catheter was exchanged for a Kumpe catheter. Kumpe catheter was directed to the left pulmonary artery. Over the [facility] guidewire, a second 10 cm infusion catheter was advanced into the left pulmonary artery. Position confirmed with contrast injection. Inner occlusion wire advanced. Catheter positions confirmed with fluoroscopy. Access site secured to the right neck with a sterile dressing. PE thrombo lysis protocol will be initiated through both catheters for the standard protocol infusion.  FINDINGS: Bilateral pulmonary angiograms confirm occlusive central large clot burden in the right hilum. Nonocclusive thrombus in the central left pulmonary arteries. IMPRESSION: Successful bilateral pulmonary artery catheterizations, angiograms, and insertion of lysis catheters to begin PE thrombo lysis protocol. Electronically Signed   By: Jerilynn Mages.  Shick M.D.   On: 01/29/2019 20:48   IR Angiogram Selective Each Additional Vessel  Result Date: 01/26/2019 INDICATION: 68 year old with history of massive pulmonary emboli and cardiogenic shock. Patient has already undergone systemic tPA and previous 12 hour catheter directed pulmonary artery thrombolysis. Patient is currently on ECMO and continues to have a large clot burden in the pulmonary arteries. EXAM: BILATERAL PULMONARY ARTERIOGRAMS; SELECTION OF BILATERAL PULMONARY ARTERIES PLACEMENT OF BILATERAL PULMONARY ARTERY INFUSION CATHETERS ULTRASOUND GUIDANCE FOR VASCULAR ACCESS COMPARISON:  02/09/2019 MEDICATIONS: None. ANESTHESIA/SEDATION: Intubated and on ECMO. FLUOROSCOPY TIME:  Fluoroscopy Time: 23 minutes, 30 seconds, 4562 mGy COMPLICATIONS: None TECHNIQUE: Informed consent was obtained from the patient's wife. Maximal Sterile Barrier Technique was utilized including caps, mask, sterile gowns, sterile gloves, sterile drape, hand hygiene and skin antiseptic. A timeout was performed prior to the initiation of the procedure. Right neck was prepped and draped in sterile fashion. Patient already had a 6 French right jugular sheath in place. Ultrasound confirmed a patent right internal jugular vein. 21 gauge needle was directed into the right internal jugular vein and micropuncture dilator set was placed. A second 6 French vascular sheath was placed. The other 6 [place] vascular sheath was exchanged due to poor function. An angled 6 French pigtail catheter was advanced through the heart into the right pulmonary artery. Right pulmonary arteriogram was performed. A Rosen  wire was placed.  Pigtail catheter was placed through the other 6 French vascular sheath and advanced into the main pulmonary artery and left pulmonary artery. Catheter was exchanged for a Kumpe catheter with a Bentson wire. Kumpe catheter was advanced into left pulmonary arteries and selective left pulmonary arteriograms were performed. Catheter was advanced into a lower lobe branch and removed over a Rosen wire. A 90 cm, 10 cm infusion length UniFuse catheter was advanced over the wire and positioned in the left lower lobe branch. Attention was directed to placement of the right pulmonary artery catheter. Kumpe catheter was advanced into right pulmonary artery branches and selective angiography was performed. Eventually, catheter was advanced into the right upper trunk that was completely occluded on the arteriogram. A 90 cm x 15 cm infusion length UniFuse catheter was placed into the right upper trunk. Both sheaths were secured to the skin with suture. FINDINGS: There continues to be a large amount of clot in the distal right pulmonary artery extending into the upper and lower trunks. Compared to the previous examination, there is now some flow into the right lower lobe branches. There continues to be no flow into the right upper lobe branches. Right pulmonary infusion catheter was placed within the right upper trunk. Segmental clot identified in the left pulmonary arteries. Left PA infusion catheter was placed within a left lower segmental branch. IMPRESSION: 1. Pulmonary arteriography demonstrates bilateral pulmonary emboli. Slightly improved flow to the right pulmonary arteries compared to the examination on 01/17/2019. There continues to be a large clot burden in the distal right pulmonary artery and no significant flow into the right upper lobe branches. 2. Successful placement of bilateral pulmonary artery infusion catheters. Initiation of catheter directed thrombolysis. Plan for 1 mg of tPA through in each  catheter for total of 2 mg/hour. Plan for 12 hour treatment. Electronically Signed   By: Markus Daft M.D.   On: 01/26/2019 18:57   IR Angiogram Selective Each Additional Vessel  Result Date: 01/26/2019 INDICATION: 68 year old with history of massive pulmonary emboli and cardiogenic shock. Patient has already undergone systemic tPA and previous 12 hour catheter directed pulmonary artery thrombolysis. Patient is currently on ECMO and continues to have a large clot burden in the pulmonary arteries. EXAM: BILATERAL PULMONARY ARTERIOGRAMS; SELECTION OF BILATERAL PULMONARY ARTERIES PLACEMENT OF BILATERAL PULMONARY ARTERY INFUSION CATHETERS ULTRASOUND GUIDANCE FOR VASCULAR ACCESS COMPARISON:  01/18/2019 MEDICATIONS: None. ANESTHESIA/SEDATION: Intubated and on ECMO. FLUOROSCOPY TIME:  Fluoroscopy Time: 23 minutes, 30 seconds, 6144 mGy COMPLICATIONS: None TECHNIQUE: Informed consent was obtained from the patient's wife. Maximal Sterile Barrier Technique was utilized including caps, mask, sterile gowns, sterile gloves, sterile drape, hand hygiene and skin antiseptic. A timeout was performed prior to the initiation of the procedure. Right neck was prepped and draped in sterile fashion. Patient already had a 6 French right jugular sheath in place. Ultrasound confirmed a patent right internal jugular vein. 21 gauge needle was directed into the right internal jugular vein and micropuncture dilator set was placed. A second 6 French vascular sheath was placed. The other 6 [place] vascular sheath was exchanged due to poor function. An angled 6 French pigtail catheter was advanced through the heart into the right pulmonary artery. Right pulmonary arteriogram was performed. A Rosen wire was placed. Pigtail catheter was placed through the other 6 French vascular sheath and advanced into the main pulmonary artery and left pulmonary artery. Catheter was exchanged for a Kumpe catheter with a Bentson wire. Kumpe catheter was advanced  into left pulmonary arteries  and selective left pulmonary arteriograms were performed. Catheter was advanced into a lower lobe branch and removed over a Rosen wire. A 90 cm, 10 cm infusion length UniFuse catheter was advanced over the wire and positioned in the left lower lobe branch. Attention was directed to placement of the right pulmonary artery catheter. Kumpe catheter was advanced into right pulmonary artery branches and selective angiography was performed. Eventually, catheter was advanced into the right upper trunk that was completely occluded on the arteriogram. A 90 cm x 15 cm infusion length UniFuse catheter was placed into the right upper trunk. Both sheaths were secured to the skin with suture. FINDINGS: There continues to be a large amount of clot in the distal right pulmonary artery extending into the upper and lower trunks. Compared to the previous examination, there is now some flow into the right lower lobe branches. There continues to be no flow into the right upper lobe branches. Right pulmonary infusion catheter was placed within the right upper trunk. Segmental clot identified in the left pulmonary arteries. Left PA infusion catheter was placed within a left lower segmental branch. IMPRESSION: 1. Pulmonary arteriography demonstrates bilateral pulmonary emboli. Slightly improved flow to the right pulmonary arteries compared to the examination on 02/12/2019. There continues to be a large clot burden in the distal right pulmonary artery and no significant flow into the right upper lobe branches. 2. Successful placement of bilateral pulmonary artery infusion catheters. Initiation of catheter directed thrombolysis. Plan for 1 mg of tPA through in each catheter for total of 2 mg/hour. Plan for 12 hour treatment. Electronically Signed   By: Markus Daft M.D.   On: 01/26/2019 18:57   IR Angiogram Selective Each Additional Vessel  Result Date: 02/14/2019 INDICATION: Acute bilateral occlusive pulmonary  emboli EXAM: Ultrasound guidance for vascular access Bilateral pulmonary artery catheterizations, angiograms, and insertion of infusion catheters for PE thrombolysis COMPARISON:  11/23/2018 MEDICATIONS: Patient is currently on ECMO ANESTHESIA/SEDATION: The patient was continuously monitored during the procedure by the ECMO team FLUOROSCOPY TIME:  Fluoroscopy Time: 14 minutes 12 seconds (822 mGy). COMPLICATIONS: None immediate. TECHNIQUE: Informed written consent was obtained from the patient's family after a thorough discussion of the procedural risks, benefits and alternatives. All questions were addressed. Maximal Sterile Barrier Technique was utilized including caps, mask, sterile gowns, sterile gloves, sterile drape, hand hygiene and skin antiseptic. A timeout was performed prior to the initiation of the procedure. Under sterile conditions, the existing 8 French sheath in the right internal jugular vein was exchanged over 2 guidewires. Two adjacent 6 French sheaths were inserted. Initially, a 6 French angle pigtail catheter was advanced through 1 of the 6 French sheaths into the right heart. This was advanced through the right heart outflow into the pulmonary arteries and initially position of the right pulmonary artery. Pulmonary angiogram performed. Initial pulmonary angiogram: Occlusive filling defects noted in the proximal right pulmonary artery. Nonocclusive filling defects in the left pulmonary artery. Over Rosen guidewire, a 10 cm infusion catheter was advanced into the right pulmonary artery extending into the lower lobe branches. Contrast injection confirms position. Inner occlusion wire advanced. Position confirmed with fluoroscopy. In a similar fashion through the second right IJ adjacent 6 French sheath, the angled 6 French pigtail catheter was advanced and positioned in the right heart initially. This was advanced through the right heart outflow into the main pulmonary artery. Catheter was exchanged  for a Kumpe catheter. Kumpe catheter was directed to the left pulmonary artery. Over the [facility]  guidewire, a second 10 cm infusion catheter was advanced into the left pulmonary artery. Position confirmed with contrast injection. Inner occlusion wire advanced. Catheter positions confirmed with fluoroscopy. Access site secured to the right neck with a sterile dressing. PE thrombo lysis protocol will be initiated through both catheters for the standard protocol infusion. FINDINGS: Bilateral pulmonary angiograms confirm occlusive central large clot burden in the right hilum. Nonocclusive thrombus in the central left pulmonary arteries. IMPRESSION: Successful bilateral pulmonary artery catheterizations, angiograms, and insertion of lysis catheters to begin PE thrombo lysis protocol. Electronically Signed   By: Jerilynn Mages.  Shick M.D.   On: 02/14/2019 20:48   IR Angiogram Selective Each Additional Vessel  Result Date: 01/20/2019 INDICATION: Acute bilateral occlusive pulmonary emboli EXAM: Ultrasound guidance for vascular access Bilateral pulmonary artery catheterizations, angiograms, and insertion of infusion catheters for PE thrombolysis COMPARISON:  11/23/2018 MEDICATIONS: Patient is currently on ECMO ANESTHESIA/SEDATION: The patient was continuously monitored during the procedure by the ECMO team FLUOROSCOPY TIME:  Fluoroscopy Time: 14 minutes 12 seconds (822 mGy). COMPLICATIONS: None immediate. TECHNIQUE: Informed written consent was obtained from the patient's family after a thorough discussion of the procedural risks, benefits and alternatives. All questions were addressed. Maximal Sterile Barrier Technique was utilized including caps, mask, sterile gowns, sterile gloves, sterile drape, hand hygiene and skin antiseptic. A timeout was performed prior to the initiation of the procedure. Under sterile conditions, the existing 8 French sheath in the right internal jugular vein was exchanged over 2 guidewires. Two adjacent 6  French sheaths were inserted. Initially, a 6 French angle pigtail catheter was advanced through 1 of the 6 French sheaths into the right heart. This was advanced through the right heart outflow into the pulmonary arteries and initially position of the right pulmonary artery. Pulmonary angiogram performed. Initial pulmonary angiogram: Occlusive filling defects noted in the proximal right pulmonary artery. Nonocclusive filling defects in the left pulmonary artery. Over Rosen guidewire, a 10 cm infusion catheter was advanced into the right pulmonary artery extending into the lower lobe branches. Contrast injection confirms position. Inner occlusion wire advanced. Position confirmed with fluoroscopy. In a similar fashion through the second right IJ adjacent 6 French sheath, the angled 6 French pigtail catheter was advanced and positioned in the right heart initially. This was advanced through the right heart outflow into the main pulmonary artery. Catheter was exchanged for a Kumpe catheter. Kumpe catheter was directed to the left pulmonary artery. Over the [facility] guidewire, a second 10 cm infusion catheter was advanced into the left pulmonary artery. Position confirmed with contrast injection. Inner occlusion wire advanced. Catheter positions confirmed with fluoroscopy. Access site secured to the right neck with a sterile dressing. PE thrombo lysis protocol will be initiated through both catheters for the standard protocol infusion. FINDINGS: Bilateral pulmonary angiograms confirm occlusive central large clot burden in the right hilum. Nonocclusive thrombus in the central left pulmonary arteries. IMPRESSION: Successful bilateral pulmonary artery catheterizations, angiograms, and insertion of lysis catheters to begin PE thrombo lysis protocol. Electronically Signed   By: Jerilynn Mages.  Shick M.D.   On: 01/22/2019 20:48   IR US Guide Vasc Access Right  Result Date: 01/26/2019 INDICATION: 68 year old with history of massive  pulmonary emboli and cardiogenic shock. Patient has already undergone systemic tPA and previous 12 hour catheter directed pulmonary artery thrombolysis. Patient is currently on ECMO and continues to have a large clot burden in the pulmonary arteries. EXAM: BILATERAL PULMONARY ARTERIOGRAMS; SELECTION OF BILATERAL PULMONARY ARTERIES PLACEMENT OF BILATERAL PULMONARY ARTERY INFUSION  CATHETERS ULTRASOUND GUIDANCE FOR VASCULAR ACCESS COMPARISON:  02/06/2019 MEDICATIONS: None. ANESTHESIA/SEDATION: Intubated and on ECMO. FLUOROSCOPY TIME:  Fluoroscopy Time: 23 minutes, 30 seconds, 9563 mGy COMPLICATIONS: None TECHNIQUE: Informed consent was obtained from the patient's wife. Maximal Sterile Barrier Technique was utilized including caps, mask, sterile gowns, sterile gloves, sterile drape, hand hygiene and skin antiseptic. A timeout was performed prior to the initiation of the procedure. Right neck was prepped and draped in sterile fashion. Patient already had a 6 French right jugular sheath in place. Ultrasound confirmed a patent right internal jugular vein. 21 gauge needle was directed into the right internal jugular vein and micropuncture dilator set was placed. A second 6 French vascular sheath was placed. The other 6 [place] vascular sheath was exchanged due to poor function. An angled 6 French pigtail catheter was advanced through the heart into the right pulmonary artery. Right pulmonary arteriogram was performed. A Rosen wire was placed. Pigtail catheter was placed through the other 6 French vascular sheath and advanced into the main pulmonary artery and left pulmonary artery. Catheter was exchanged for a Kumpe catheter with a Bentson wire. Kumpe catheter was advanced into left pulmonary arteries and selective left pulmonary arteriograms were performed. Catheter was advanced into a lower lobe branch and removed over a Rosen wire. A 90 cm, 10 cm infusion length UniFuse catheter was advanced over the wire and positioned  in the left lower lobe branch. Attention was directed to placement of the right pulmonary artery catheter. Kumpe catheter was advanced into right pulmonary artery branches and selective angiography was performed. Eventually, catheter was advanced into the right upper trunk that was completely occluded on the arteriogram. A 90 cm x 15 cm infusion length UniFuse catheter was placed into the right upper trunk. Both sheaths were secured to the skin with suture. FINDINGS: There continues to be a large amount of clot in the distal right pulmonary artery extending into the upper and lower trunks. Compared to the previous examination, there is now some flow into the right lower lobe branches. There continues to be no flow into the right upper lobe branches. Right pulmonary infusion catheter was placed within the right upper trunk. Segmental clot identified in the left pulmonary arteries. Left PA infusion catheter was placed within a left lower segmental branch. IMPRESSION: 1. Pulmonary arteriography demonstrates bilateral pulmonary emboli. Slightly improved flow to the right pulmonary arteries compared to the examination on 01/19/2019. There continues to be a large clot burden in the distal right pulmonary artery and no significant flow into the right upper lobe branches. 2. Successful placement of bilateral pulmonary artery infusion catheters. Initiation of catheter directed thrombolysis. Plan for 1 mg of tPA through in each catheter for total of 2 mg/hour. Plan for 12 hour treatment. Electronically Signed   By: Markus Daft M.D.   On: 01/26/2019 18:57   DG Chest Port 1 View  Result Date: 01/28/2019 CLINICAL DATA:  Respiratory distress EXAM: PORTABLE CHEST 1 VIEW COMPARISON:  Radiograph 01/27/2019 FINDINGS: Endotracheal tube unchanged. Introduction of NG tube with tip at the lower margin of the film midline. LEFT central venous line unchanged. Large bore catheter from a in IVC approach projects over the RIGHT heart (PE  lysis catheter sheath). Improved aeration in lungs. Decrease in venous congestion and pleural fluid. IMPRESSION: 1. Improved ventilation to the lungs and decreased venous congestion. 2. Introduction of NG tube which appears in good position. The tip is below the margin of the film. 3. Venous catheters are unchanged.  Electronically Signed   By: Suzy Bouchard M.D.   On: 01/28/2019 10:00   DG Chest Port 1 View  Result Date: 01/27/2019 CLINICAL DATA:  68 year old male with respiratory failure on [facility]. Bilateral pulmonary emboli. EXAM: PORTABLE CHEST 1 VIEW COMPARISON:  01/26/2019 CTA chest, portable chest and earlier. FINDINGS: Portable AP semi upright view at 0549 hours. Stable endotracheal tube tip at the level the clavicles. Stable left IJ approach central line. There is an inferior approach vascular catheter again projecting through the right atrium to the cavoatrial junction. Chronic right axillary vascular graft. Bilateral lower lobe consolidation as demonstrated by CTA yesterday. Upper lung ventilation and pulmonary vascularity remains stable. No pneumothorax. Stable cardiac size and mediastinal contours. IMPRESSION: 1.  Stable lines and tubes. 2. Ventilation appears stable since yesterday, bilateral lower lobe consolidation. Electronically Signed   By: Genevie Ann M.D.   On: 01/27/2019 08:27   DG CHEST PORT 1 VIEW  Result Date: 01/26/2019 CLINICAL DATA:  Central line placement, intubation, ECMO EXAM: PORTABLE CHEST 1 VIEW COMPARISON:  Same day chest radiograph, 11/26/2018 FINDINGS: Interval placement of a left neck vascular catheter and sheath, tip projecting over the left brachiocephalic vein. Unchanged right neck vascular catheter. Inferior approach venous ECMO cannula over the right atrium. Endotracheal tube in unchanged position. Cardiomegaly. Bilateral pleural effusions and associated atelectasis or consolidation. No new airspace opacity. IMPRESSION: 1. Interval placement of a left neck vascular  catheter and sheath, tip projecting over the left brachiocephalic vein. 2. Otherwise unchanged AP portable examination with extensive support apparatus including endotracheal tube and ECMO. Electronically Signed   By: Eddie Candle M.D.   On: 01/26/2019 12:35   DG Chest Port 1 View  Result Date: 01/26/2019 CLINICAL DATA:  Endotracheal tube present. On ECMO. EXAM: PORTABLE CHEST 1 VIEW COMPARISON:  Radiograph yesterday. FINDINGS: Endotracheal tube tip 3.8 cm from the carina. Bilateral internal jugular sheaths in place. ECMO catheter in the right axilla and projecting over the atrial SVC junction. Lower lung volumes from prior exam. Unchanged heart size and mediastinal contours. Developing hazy bibasilar opacities, left greater than right. No pneumothorax. IMPRESSION: 1. Endotracheal tube 3.8 cm from the carina. 2. Bilateral internal jugular sheaths.  ECMO catheters in place. 3. Lower lung volumes since yesterday with developing hazy bibasilar opacities, left greater than right, likely pleural effusions and adjacent atelectasis. Electronically Signed   By: Keith Rake M.D.   On: 01/26/2019 06:04   DG Chest Port 1 View  Result Date: 01/25/2019 CLINICAL DATA:  Endotracheal tube present. Patient on neck mass. EXAM: PORTABLE CHEST 1 VIEW COMPARISON:  Radiograph yesterday. FINDINGS: Endotracheal tube tip 2 cm from the carina. Bilateral pulmonary thrombolysis infusion catheters femoral right internal jugular approach unchanged in position from prior exam. Left internal jugular cannula in the region of the upper mediastinum, unchanged. Right upper extremity ECMO catheter in the region of the axilla. X catheter from an inferior approach in the atrial SVC junction. Unchanged heart size and mediastinal contours. Slight worsening retrocardiac opacity. No pneumothorax. No evidence of pleural effusion. IMPRESSION: 1. Support apparatus as described. 2. Slight worsening hazy opacity at the left lung base. Electronically  Signed   By: Keith Rake M.D.   On: 01/25/2019 06:26   DG CHEST PORT 1 VIEW  Result Date: 01/25/2019 CLINICAL DATA:  ECMO EXAM: PORTABLE CHEST 1 VIEW COMPARISON:  January 24, 2019 FINDINGS: The endotracheal tube terminates above the carina by approximately 3.6 cm. There are bilateral pulmonary thrombolysis infusion catheters. A left-sided central  venous catheter is noted. The heart size is enlarged. Bilateral airspace opacities are again noted. A stent is partially visualized projecting over the patient's right axillary region. IMPRESSION: 1. Lines and tubes as above. The bilateral UniFuse catheters appear to be relatively stable when compared to postprocedural imaging earlier in the day. 2. Persistent patchy bilateral airspace opacities as seen on prior CT. Electronically Signed   By: Constance Holster M.D.   On: 02/10/2019 20:49   DG Chest Portable 1 View  Result Date: 02/13/2019 CLINICAL DATA:  ECMO EXAM: PORTABLE CHEST 1 VIEW COMPARISON:  Portable exam 1702 hours compared to 01/25/2019 FINDINGS: Tip of endotracheal tube projects approximately 3.1 cm above carina. Large-bore catheter projects over SVC, RIGHT atrium and IVC. BILATERAL jugular lines. RIGHT upper extremity line versus graft. Stable heart size. Atelectasis versus infiltrate LEFT upper lobe. Remaining lungs clear. No pleural effusion or pneumothorax. IMPRESSION: LEFT upper lobe atelectasis versus consolidation. Lines and tubes as above. Electronically Signed   By: Lavonia Dana M.D.   On: 01/18/2019 17:19   DG Chest Portable 1 View  Result Date: 01/31/2019 CLINICAL DATA:  Status post intubation, recent CPR EXAM: PORTABLE CHEST 1 VIEW COMPARISON:  Film from earlier in the same day. FINDINGS: Patchy opacity is again noted in the left mid lung stable from the prior exam. Lungs are otherwise clear. Endotracheal tube is noted in satisfactory position. A rounded metallic artifact is noted overlying the upper chest related to patient's hand.  No other focal abnormality is noted. IMPRESSION: Stable opacity over the left lung. Endotracheal tube in satisfactory position. Extrinsic artifact over the upper chest. Electronically Signed   By: Inez Catalina M.D.   On: 01/17/2019 20:52   DG Chest Portable 1 View  Result Date: 01/28/2019 CLINICAL DATA:  Shortness of breath increasing over the past few hours EXAM: PORTABLE CHEST 1 VIEW COMPARISON:  01/27/2007 FINDINGS: Cardiac shadow is stable. Tortuosity of the thoracic aorta is again seen. Patchy left perihilar infiltrate is noted. No sizable effusion is seen. Old rib fractures on the right are seen. IMPRESSION: Patchy left perihilar infiltrate. Followup films following appropriate therapy are recommended. Electronically Signed   By: Inez Catalina M.D.   On: 01/25/2019 17:08   DG Abd Portable 1V  Result Date: 01/28/2019 CLINICAL DATA:  68 year old male with feeding tube. EXAM: PORTABLE ABDOMEN - 1 VIEW COMPARISON:  01/27/2019 portable abdomen and earlier. FINDINGS: AP portable supine views at 0628 hours. A gastric lap band is in place as before. The enteric tube position is unchanged, with the side hole located immediately above the band, the tip passing through the band into the proximal stomach. There is an inferior approach vascular graft or large caliber catheter. Negative visible bowel gas pattern. Small catheter projects over the lower pelvis. No acute osseous abnormality identified. IMPRESSION: 1. Stable position of the enteric tube which passes through the gastric lap band, side hole just proximal to the band. 2. Negative bowel gas pattern. 3. Large caliber inferior approach IVC vascular graft or catheter in place. Small probable bladder catheter projects over the pelvis. Electronically Signed   By: Genevie Ann M.D.   On: 01/28/2019 10:01   DG C-Arm 1-60 Min-No Report  Result Date: 02/14/2019 Fluoroscopy was utilized by the requesting physician.  No radiographic interpretation.   ECHOCARDIOGRAM  COMPLETE  Result Date: 02/09/2019   ECHOCARDIOGRAM REPORT   Patient Name:   Philip Wolfe Date of Exam: 01/27/2019 Medical Rec #:  [id number]     Height:  69.0 in Accession #:    [id number]    Weight:       323.0 lb Date of Birth:  1950/03/16      BSA:          2.53 m Patient Age:    68 years      BP:           91/55 mmHg Patient Gender: M             HR:           99 bpm. Exam Location:  Inpatient Procedure: 2D Echo STAT ECHO Indications:    pulmonary embolus  History:        Patient has no prior history of Echocardiogram examinations.                 Risk Factors:Hypertension.  Sonographer:    Jannett Celestine RDCS (AE) Referring Phys: [id number] [name]  Sonographer Comments: Technically difficult study due to poor echo windows, no parasternal window, suboptimal apical window, patient is morbidly obese and echo performed with patient supine and on artificial respirator. Image acquisition challenging due to patient body habitus and Image acquisition challenging due to respiratory motion. IMPRESSIONS  1. Global right ventricle has moderately reduced systolic function.The right ventricular size is mildly enlarged. No increase in right ventricular wall thickness. D-shaped interventricular septum suggesteive of RV pressure/volume overload. Difficult images but the free wall appears hypokinetic with preservation of the RV apical function (McConnell's sign), suggestive of hemodynamically significant PE.  2. Left ventricular ejection fraction, by visual estimation, is 45 to 50%. The left ventricle has mildly decreased function. There is mildly increased left ventricular hypertrophy. Extremely difficult images of the LV, Definity was not used. The above EF is a gross estimate. Cannot comment on motion of individual walls.  3. Left ventricular diastolic parameters are indeterminate.  4. Mild mitral annular calcification.  5. The mitral valve is normal in structure. Mild mitral valve regurgitation. No evidence of  mitral stenosis.  6. The pulmonic valve was not assessed. Pulmonic valve regurgitation is not visualized.  7. The tricuspid valve is not well visualized. Tricuspid valve regurgitation is not demonstrated.  8. The aortic valve was not well visualized. Aortic valve regurgitation is not visualized.  9. The aortic root was not well visualized. 10. The interatrial septum was not well visualized. 11. Right atrial size was not well visualized. 12. Left atrial size was not well visualized. 13. Technically extremely difficult study. FINDINGS  Left Ventricle: Left ventricular ejection fraction, by visual estimation, is 45 to 50%. The left ventricle has mildly decreased function. The left ventricle is not well visualized. The left ventricular internal cavity size was the left ventricle is normal in size. There is mildly increased left ventricular hypertrophy. Left ventricular diastolic parameters are indeterminate. Right Ventricle: The right ventricular size is mildly enlarged. No increase in right ventricular wall thickness. Global RV systolic function is has moderately reduced systolic function. Left Atrium: Left atrial size was not well visualized. Right Atrium: Right atrial size was not well visualized Pericardium: There is no evidence of pericardial effusion. Mitral Valve: The mitral valve is normal in structure. Mild mitral annular calcification. No evidence of mitral valve stenosis by observation. Mild mitral valve regurgitation. Tricuspid Valve: The tricuspid valve is not well visualized. Tricuspid valve regurgitation is not demonstrated. Aortic Valve: The aortic valve was not well visualized. Aortic valve regurgitation is not visualized. Pulmonic Valve: The pulmonic valve was not assessed. Pulmonic valve  regurgitation is not visualized. Aorta: The aortic root was not well visualized. Venous: The inferior vena cava was not well visualized. IAS/Shunts: The interatrial septum was not well visualized.  Loralie Champagne MD  Electronically signed by Loralie Champagne MD Signature Date/Time: 01/31/2019/10:13:07 AM    Final    VAS [place] LOWER EXTREMITY VENOUS (DVT)  Result Date: 01/28/2019  Lower Venous Study Indications: Pulmonary embolism, and On an ECMO machine.  Comparison Study: No priors. Performing Technologist: Oda Cogan RDMS, RVT  Examination Guidelines: A complete evaluation includes B-mode imaging, spectral Doppler, color Doppler, and power Doppler as needed of all accessible portions of each vessel. Bilateral testing is considered an integral part of a complete examination. Limited examinations for reoccurring indications may be performed as noted.  +---------+---------------+---------+-----------+----------+--------------+ RIGHT    CompressibilityPhasicitySpontaneityPropertiesThrombus Aging +---------+---------------+---------+-----------+----------+--------------+ CFV      Full           Yes      Yes                                 +---------+---------------+---------+-----------+----------+--------------+ SFJ      Full                                                        +---------+---------------+---------+-----------+----------+--------------+ FV Prox  Full                                                        +---------+---------------+---------+-----------+----------+--------------+ FV Mid   Full                                                        +---------+---------------+---------+-----------+----------+--------------+ FV DistalFull                                                        +---------+---------------+---------+-----------+----------+--------------+ PFV      Full                                                        +---------+---------------+---------+-----------+----------+--------------+ POP      Full           Yes      Yes                                 +---------+---------------+---------+-----------+----------+--------------+ PTV       Full                                                        +---------+---------------+---------+-----------+----------+--------------+  PERO     Full                                                        +---------+---------------+---------+-----------+----------+--------------+   +---------+---------------+---------+-----------+----------+-----------------+ LEFT     CompressibilityPhasicitySpontaneityPropertiesThrombus Aging    +---------+---------------+---------+-----------+----------+-----------------+ CFV      Full           Yes      Yes                                    +---------+---------------+---------+-----------+----------+-----------------+ SFJ      Full                                                           +---------+---------------+---------+-----------+----------+-----------------+ FV Prox  Full                                                           +---------+---------------+---------+-----------+----------+-----------------+ FV Mid   Full                                                           +---------+---------------+---------+-----------+----------+-----------------+ FV DistalFull                                                           +---------+---------------+---------+-----------+----------+-----------------+ PFV      Full                                                           +---------+---------------+---------+-----------+----------+-----------------+ POP      None           No       No                   Age Indeterminate +---------+---------------+---------+-----------+----------+-----------------+ PTV      Partial                                      Age Indeterminate +---------+---------------+---------+-----------+----------+-----------------+ PERO     Partial                                      Age Indeterminate  +---------+---------------+---------+-----------+----------+-----------------+  Summary: Right: There is no evidence of deep vein thrombosis in the lower extremity. Left: Findings consistent with age indeterminate deep vein thrombosis involving the left popliteal vein, left posterior tibial veins, and left peroneal veins.  *See table(s) above for measurements and observations. Electronically signed by Servando Snare MD on 01/28/2019 at 10:19:16 AM.    Final    ECHOCARDIOGRAM LIMITED  Result Date: 01/28/2019   ECHOCARDIOGRAM LIMITED REPORT   Patient Name:   Philip Wolfe Date of Exam: 01/28/2019 Medical Rec #:  [id number]     Height:       69.0 in Accession #:    [id number]    Weight:       338.2 lb Date of Birth:  Aug 17, 1950      BSA:          2.58 m Patient Age:    69 years      BP:           109/72 mmHg Patient Gender: M             HR:           83 bpm. Exam Location:  Inpatient  Procedure: Limited Echo, Limited Color Doppler and Cardiac Doppler Indications:    I26.02 Pulmonary embolus  History:        Patient has prior history of Echocardiogram examinations, most                 recent 01/27/2019.  Sonographer:    Tiffany Dance Referring Phys: [place] Comments: Technically difficult study due to poor echo windows, suboptimal apical window and echo performed with patient supine and on artificial respirator. Definity not used to due to incompatibility with ECMO. IMPRESSIONS  1. Limited study with suboptimal RV visualization  2. LVEF 50-55%, mild LVH  3. RV poorly visualized but appears to have at least mildly depressed systolic function.  4. The tricuspid valve is grossly normal. Tricuspid valve regurgitation is trivial.  5. Aortic dilatation noted.  6. 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  7. Mildly elevated pulmonary artery systolic pressure.  8. Aortic root could not be assessed.  9. The aortic valve was not assessed. Aortic valve regurgitation is not  visualized. 10. Left ventricular ejection fraction, by visual estimation, is 50 to 55%. The left ventricle has low normal function. There is mildly increased left ventricular hypertrophy. 11. The mitral valve was not assessed. No evidence of mitral valve regurgitation. 12. The pulmonic valve was not assessed. Pulmonic valve regurgitation is not visualized. 13. Global right ventricle has mildly reduced systolic function.The right ventricular size is mildly enlarged. No increase in right ventricular wall thickness. FINDINGS  Left Ventricle: Left ventricular ejection fraction, by visual estimation, is 50 to 55%. The left ventricle has low normal function. There is mildly increased left ventricular wall thickness. Right Ventricle: The right ventricular size is mildly enlarged. No increase in right ventricular wall thickness. Global RV systolic function is has mildly reduced systolic function. The tricuspid regurgitant velocity is 1.87 m/s, and with an assumed right atrial pressure of 8 mmHg, the estimated right ventricular systolic pressure is mildly elevated at 22.0 mmHg. RV poorly visualized but appears to have at least mildly depressed systolic function. Pericardium: There is no evidence of pericardial effusion. Tricuspid Valve: The tricuspid valve is grossly normal. Tricuspid valve regurgitation is trivial. Aortic Valve: The aortic valve was not assessed. Pulmonic Valve: The pulmonic valve was not assessed.  Pulmonic valve regurgitation is not visualized. Aorta: Aortic root could not be assessed. The aortic root and ascending aorta are structurally normal, with no evidence of dilitation and aortic dilatation noted. 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  LEFT VENTRICLE          Normals PLAX 2D LVIDd:         4.80 cm  3.6 cm LVIDs:         3.59 cm  1.7 cm LV PW:         1.19 cm  1.4 cm LV IVS:        1.39 cm  1.3 cm LVOT diam:     1.90 cm  2.0 cm LV SV:         53 ml    79 ml LV SV Index:    18.97    45 ml/m2 LVOT Area:     2.84 cm 3.14 cm2  RIGHT VENTRICLE             IVC RV S prime:     11.90 cm/s  IVC diam: 2.17 cm TAPSE (M-mode): 1.4 cm LEFT ATRIUM         Index LA diam:    2.90 cm 1.12 cm/m  AORTIC VALVE             Normals LVOT Vmax:   108.00 cm/s LVOT Vmean:  67.200 cm/s 75 cm/s LVOT VTI:    0.155 m     25.3 cm  AORTA                 Normals Ao Root diam: 3.90 cm 31 mm Ao Asc diam:  4.10 cm 31 mm TRICUSPID VALVE             Normals TR Peak grad:   14.0 mmHg TR Vmax:        198.00 cm/s 288 cm/s  SHUNTS Systemic VTI:  0.16 m Systemic Diam: 1.90 cm  Lyman Bishop MD Electronically signed by Lyman Bishop MD Signature Date/Time: 01/28/2019/4:40:25 PM    Final    ECHOCARDIOGRAM LIMITED  Result Date: 01/27/2019   ECHOCARDIOGRAM LIMITED REPORT   Patient Name:   Philip Wolfe Date of Exam: 01/27/2019 Medical Rec #:  [id number]     Height:       69.0 in Accession #:    [id number]    Weight:       331.1 lb Date of Birth:  [birth year]      BSA:          2.56 m Patient Age:    72 years      BP:           87/64 mmHg Patient Gender: M             HR:           80 bpm. Exam Location:  Inpatient  Procedure: 2D Echo, Color Doppler and Intracardiac Opacification Agent Indications:    D98.33 Acute diastolic (congestive) heart failure  History:        Patient has prior history of Echocardiogram examinations, most                 recent 01/26/2019. Active ECMO at time of study.  Sonographer:    Raquel Sarna Senior RDCS Referring Phys: [place] Comments: Technically difficult study due to poor echo windows and patient is morbidly obese. Contrast approved by Dr. Aundra Dubin IMPRESSIONS  1. Left  ventricular ejection fraction, by visual estimation, is 50 to 55%. The left ventricle has normal function. There is severely increased left ventricular hypertrophy.  2. Left ventricular diastolic function could not be evaluated.  3. Global right ventricle was not well visualized.The right ventricular size is not  well visualized. Right vetricular wall thickness was not assessed.  4. Left atrial size was not well visualized.  5. Right atrial size was not well visualized.  6. The pericardium was not well visualized.  7. The mitral valve was not well visualized. not assessed mitral valve regurgitation.  8. The tricuspid valve is not well visualized. Tricuspid valve regurgitation not assessed.  9. Aortic valve regurgitation not assessed. 10. The aortic valve was not well visualized. Aortic valve regurgitation not assessed. 11. The pulmonic valve was not well visualized. Pulmonic valve regurgitation not assessed. 12. The aortic root was not well visualized. 13. Extremely technically difficult echo with limited and poor image quality. 14. The interatrial septum was not well visualized. FINDINGS  Left Ventricle: Left ventricular ejection fraction, by visual estimation, is 50 to 55%. The left ventricle has normal function. The left ventricle is not well visualized. There is severely increased left ventricular hypertrophy. The left ventricular diastology could not be evaluated due to nondiagnostic images. Left ventricular diastolic function could not be evaluated. Right Ventricle: The right ventricular size is not well visualized. Right vetricular wall thickness was not assessed. Global RV systolic function is was not well visualized. Left Atrium: Left atrial size was not well visualized. Right Atrium: Right atrial size was not well visualized Pericardium: The pericardium was not well visualized. Mitral Valve: The mitral valve was not well visualized. Not assessed mitral valve regurgitation. Tricuspid Valve: The tricuspid valve is not well visualized. Tricuspid valve regurgitation not assessed. Aortic Valve: The aortic valve was not well visualized. Aortic valve regurgitation not assessed. Pulmonic Valve: The pulmonic valve was not well visualized. Pulmonic valve regurgitation not assessed. Aorta: The aortic root was not well  visualized. Shunts: The interatrial septum was not well visualized. Additional Comments: Extremely technically difficult echo with limited and poor image quality.  Mertie Moores MD Electronically signed by Mertie Moores MD Signature Date/Time: 01/27/2019/11:17:10 AM    Final    ECHOCARDIOGRAM LIMITED  Result Date: 01/26/2019   ECHOCARDIOGRAM LIMITED REPORT   Patient Name:   Philip Wolfe Date of Exam: 01/26/2019 Medical Rec #:  [id number]     Height:       69.0 in Accession #:    [id number]    Weight:       335.5 lb Date of Birth:  [birth year]      BSA:          2.57 m Patient Age:    49 years      BP:           116/72 mmHg Patient Gender: M             HR:           89 bpm. Exam Location:  Inpatient  Procedure: Limited Echo Indications:    Pulmonary embolism  History:        Patient has prior history of Echocardiogram examinations, most                 recent 01/25/2019. [facility] ECMO, Cardiogenic shock, pulmonary                 embolism, resp. failure.  Sonographer:    Dustin Flock Referring Phys:  [place] Comments: Technically difficult study due to poor echo windows, Technically challenging study due to limited acoustic windows and patient is morbidly obese. Image acquisition challenging due to patient body habitus. IMPRESSIONS  1. Extremely limited; LV and RV function cannot be determined with this study; no pericardial effusion; no other useful information obtained; suggest TEE if clinically indicated. FINDINGS  Left Ventricle: Left ventricular ejection fraction, by visual estimation, is Unable to quantitate%. The left ventricle has unable to quantitate function. The left ventricle is not well visualized. There is no left ventricular hypertrophy. Right Ventricle: The right ventricular size is not well visualized. Right vetricular wall thickness was not assessed. Global RV systolic function is was not assessed. Left Atrium: Left atrial size was not assessed. Right Atrium: Right atrial  size was not assessed Pericardium: There is no evidence of pericardial effusion. Mitral Valve: The mitral valve was not well visualized. Not assessed mitral valve regurgitation. Tricuspid Valve: The tricuspid valve is not well visualized. Tricuspid valve regurgitation Not assessed. Aortic Valve: The aortic valve was not well visualized. Aortic valve regurgitation not assessed. Pulmonic Valve: The pulmonic valve was not well visualized. Pulmonic valve regurgitation Not assessed. Aorta: The aortic root is normal in size and structure. Shunts: The interatrial septum was not assessed.  Kirk Ruths MD Electronically signed by Kirk Ruths MD Signature Date/Time: 01/26/2019/12:06:32 PM    Final    ECHOCARDIOGRAM LIMITED  Result Date: 01/25/2019   ECHOCARDIOGRAM LIMITED REPORT   Patient Name:   Philip Wolfe Date of Exam: 01/25/2019 Medical Rec #:  [id number]     Height:       69.0 in Accession #:    [id number]    Weight:       326.7 lb Date of Birth:  Dec 29, 1950      BSA:          2.55 m Patient Age:    81 years      BP:           98/75 mmHg Patient Gender: M             HR:           88 bpm. Exam Location:  Inpatient  Procedure: Limited Echo Indications:    Pulmonary embolus 415.19  History:        Patient has prior history of Echocardiogram examinations, most                 recent 02/09/2019.  Sonographer:    Paulita Fujita RDCS Referring Phys: [place]  1. Global right ventricle has severely reduced systolic function.The right ventricular size is severely enlarged. No increase in right ventricular wall thickness.  2. Left ventricular ejection fraction, by visual estimation, is 50 to 55%. The left ventricle has low normal function. There is no left ventricular hypertrophy.  3. TR signal is inadequate for assessing pulmonary artery systolic pressure.  4. Right ventricular volume and pressure overload.  5. The left ventricle demonstrates regional wall motion abnormalities.  6. Presence of  pericardial fat pad.  7. Trivial pericardial effusion is present.  8. The tricuspid valve is grossly normal. Tricuspid valve regurgitation is trivial.  9. No significant change with 01/19/2019. FINDINGS  Left Ventricle: Left ventricular ejection fraction, by visual estimation, is 50 to 55%. The left ventricle has low normal function. The left ventricle demonstrates regional wall motion abnormalities. There is no left ventricular hypertrophy. The interventricular septum is flattened in  systole and diastole, consistent with right ventricular pressure and volume overload. Left ventricular diastolic function could not be evaluated. Right Ventricle: The right ventricular size is severely enlarged. No increase in right ventricular wall thickness. Global RV systolic function is has severely reduced systolic function. Left Atrium: Left atrial size was normal in size. Right Atrium: Right atrial size was normal in size Pericardium: Trivial pericardial effusion is present. Presence of pericardial fat pad. Mitral Valve: The mitral valve is grossly normal. NWV mitral valve regurgitation. Tricuspid Valve: The tricuspid valve is grossly normal. Tricuspid valve regurgitation is trivial. Aortic Valve: The aortic valve is tricuspid. Aortic valve regurgitation is not visualized. The aortic valve is structurally normal, with no evidence of sclerosis or stenosis. Pulmonic Valve: The pulmonic valve was not well visualized. Pulmonic valve regurgitation NWV. Aorta: The aortic root is normal in size and structure. Venous: The inferior vena cava was not well visualized. Shunts: The interatrial septum was not well visualized.  Eleonore Chiquito MD Electronically signed by Eleonore Chiquito MD Signature Date/Time: 01/25/2019/3:59:24 PM    Final    IR INFUSION THROMBOL ARTERIAL INITIAL (MS)  Result Date: 01/27/2019 INDICATION: 68 year old with history of massive pulmonary emboli and cardiogenic shock. Patient has already undergone systemic tPA and  previous 12 hour catheter directed pulmonary artery thrombolysis. Patient is currently on ECMO and continues to have a large clot burden in the pulmonary arteries.  EXAM: BILATERAL PULMONARY ARTERIOGRAMS; SELECTION OF BILATERAL PULMONARY ARTERIES  PLACEMENT OF BILATERAL PULMONARY ARTERY INFUSION CATHETERS  ULTRASOUND GUIDANCE FOR VASCULAR ACCESS  COMPARISON:  01/19/2019  MEDICATIONS: None.  ANESTHESIA/SEDATION: Intubated and on ECMO.  FLUOROSCOPY TIME:  Fluoroscopy Time: 23 minutes, 30 seconds, 1610 mGy  COMPLICATIONS: None  TECHNIQUE: Informed consent was obtained from the patient's wife. Maximal Sterile Barrier Technique was utilized including caps, mask, sterile gowns, sterile gloves, sterile drape, hand hygiene and skin antiseptic. A timeout was performed prior to the initiation of the procedure.  Right neck was prepped and draped in sterile fashion. Patient already had a 6 French right jugular sheath in place. Ultrasound confirmed a patent right internal jugular vein. 21 gauge needle was directed into the right internal jugular vein and micropuncture dilator set was placed. A second 6 French vascular sheath was placed. The other 6 [place] vascular sheath was exchanged due to poor function. An angled 6 French pigtail catheter was advanced through the heart into the right pulmonary artery. Right pulmonary arteriogram was performed. A Rosen wire was placed. Pigtail catheter was placed through the other 6 French vascular sheath and advanced into the main pulmonary artery and left pulmonary artery. Catheter was exchanged for a Kumpe catheter with a Bentson wire. Kumpe catheter was advanced into left pulmonary arteries and selective left pulmonary arteriograms were performed. Catheter was advanced into a lower lobe branch and removed over a Rosen wire. A 90 cm, 10 cm infusion length UniFuse catheter was advanced over the wire and positioned in the left lower lobe branch. Attention was directed to placement  of the right pulmonary artery catheter. Kumpe catheter was advanced into right pulmonary artery branches and selective angiography was performed. Eventually, catheter was advanced into the right upper trunk that was completely occluded on the arteriogram. A 90 cm x 15 cm infusion length UniFuse catheter was placed into the right upper trunk. Both sheaths were secured to the skin with suture.  FINDINGS: There continues to be a large amount of clot in the distal right pulmonary artery extending into the upper and lower  trunks. Compared to the previous examination, there is now some flow into the right lower lobe branches. There continues to be no flow into the right upper lobe branches. Right pulmonary infusion catheter was placed within the right upper trunk. Segmental clot identified in the left pulmonary arteries. Left PA infusion catheter was placed within a left lower segmental branch.  IMPRESSION: 1. Pulmonary arteriography demonstrates bilateral pulmonary emboli. Slightly improved flow to the right pulmonary arteries compared to the examination on 02/15/2019. There continues to be a large clot burden in the distal right pulmonary artery and no significant flow into the right upper lobe branches. 2. Successful placement of bilateral pulmonary artery infusion catheters. Initiation of catheter directed thrombolysis. Plan for 1 mg of tPA through in each catheter for total of 2 mg/hour. Plan for 12 hour treatment.   Electronically Signed   By: Markus Daft M.D.   On: 01/26/2019 18:57  IR INFUSION THROMBOL ARTERIAL INITIAL (MS)  Result Date: 01/26/2019 INDICATION: 68 year old with history of massive pulmonary emboli and cardiogenic shock. Patient has already undergone systemic tPA and previous 12 hour catheter directed pulmonary artery thrombolysis. Patient is currently on ECMO and continues to have a large clot burden in the pulmonary arteries. EXAM: BILATERAL PULMONARY ARTERIOGRAMS; SELECTION OF BILATERAL  PULMONARY ARTERIES PLACEMENT OF BILATERAL PULMONARY ARTERY INFUSION CATHETERS ULTRASOUND GUIDANCE FOR VASCULAR ACCESS COMPARISON:  02/08/2019 MEDICATIONS: None. ANESTHESIA/SEDATION: Intubated and on ECMO. FLUOROSCOPY TIME:  Fluoroscopy Time: 23 minutes, 30 seconds, 8588 mGy COMPLICATIONS: None TECHNIQUE: Informed consent was obtained from the patient's wife. Maximal Sterile Barrier Technique was utilized including caps, mask, sterile gowns, sterile gloves, sterile drape, hand hygiene and skin antiseptic. A timeout was performed prior to the initiation of the procedure. Right neck was prepped and draped in sterile fashion. Patient already had a 6 French right jugular sheath in place. Ultrasound confirmed a patent right internal jugular vein. 21 gauge needle was directed into the right internal jugular vein and micropuncture dilator set was placed. A second 6 French vascular sheath was placed. The other 6 [place] vascular sheath was exchanged due to poor function. An angled 6 French pigtail catheter was advanced through the heart into the right pulmonary artery. Right pulmonary arteriogram was performed. A Rosen wire was placed. Pigtail catheter was placed through the other 6 French vascular sheath and advanced into the main pulmonary artery and left pulmonary artery. Catheter was exchanged for a Kumpe catheter with a Bentson wire. Kumpe catheter was advanced into left pulmonary arteries and selective left pulmonary arteriograms were performed. Catheter was advanced into a lower lobe branch and removed over a Rosen wire. A 90 cm, 10 cm infusion length UniFuse catheter was advanced over the wire and positioned in the left lower lobe branch. Attention was directed to placement of the right pulmonary artery catheter. Kumpe catheter was advanced into right pulmonary artery branches and selective angiography was performed. Eventually, catheter was advanced into the right upper trunk that was completely occluded on the  arteriogram. A 90 cm x 15 cm infusion length UniFuse catheter was placed into the right upper trunk. Both sheaths were secured to the skin with suture. FINDINGS: There continues to be a large amount of clot in the distal right pulmonary artery extending into the upper and lower trunks. Compared to the previous examination, there is now some flow into the right lower lobe branches. There continues to be no flow into the right upper lobe branches. Right pulmonary infusion catheter was placed within the right upper  trunk. Segmental clot identified in the left pulmonary arteries. Left PA infusion catheter was placed within a left lower segmental branch. IMPRESSION: 1. Pulmonary arteriography demonstrates bilateral pulmonary emboli. Slightly improved flow to the right pulmonary arteries compared to the examination on 02/15/2019. There continues to be a large clot burden in the distal right pulmonary artery and no significant flow into the right upper lobe branches. 2. Successful placement of bilateral pulmonary artery infusion catheters. Initiation of catheter directed thrombolysis. Plan for 1 mg of tPA through in each catheter for total of 2 mg/hour. Plan for 12 hour treatment. Electronically Signed   By: Markus Daft M.D.   On: 01/26/2019 18:57   IR INFUSION THROMBOL ARTERIAL INITIAL (MS)  Result Date: 02/14/2019 INDICATION: Acute bilateral occlusive pulmonary emboli EXAM: Ultrasound guidance for vascular access Bilateral pulmonary artery catheterizations, angiograms, and insertion of infusion catheters for PE thrombolysis COMPARISON:  11/23/2018 MEDICATIONS: Patient is currently on ECMO ANESTHESIA/SEDATION: The patient was continuously monitored during the procedure by the ECMO team FLUOROSCOPY TIME:  Fluoroscopy Time: 14 minutes 12 seconds (822 mGy). COMPLICATIONS: None immediate. TECHNIQUE: Informed written consent was obtained from the patient's family after a thorough discussion of the procedural risks, benefits  and alternatives. All questions were addressed. Maximal Sterile Barrier Technique was utilized including caps, mask, sterile gowns, sterile gloves, sterile drape, hand hygiene and skin antiseptic. A timeout was performed prior to the initiation of the procedure. Under sterile conditions, the existing 8 French sheath in the right internal jugular vein was exchanged over 2 guidewires. Two adjacent 6 French sheaths were inserted. Initially, a 6 French angle pigtail catheter was advanced through 1 of the 6 French sheaths into the right heart. This was advanced through the right heart outflow into the pulmonary arteries and initially position of the right pulmonary artery. Pulmonary angiogram performed. Initial pulmonary angiogram: Occlusive filling defects noted in the proximal right pulmonary artery. Nonocclusive filling defects in the left pulmonary artery. Over Rosen guidewire, a 10 cm infusion catheter was advanced into the right pulmonary artery extending into the lower lobe branches. Contrast injection confirms position. Inner occlusion wire advanced. Position confirmed with fluoroscopy. In a similar fashion through the second right IJ adjacent 6 French sheath, the angled 6 French pigtail catheter was advanced and positioned in the right heart initially. This was advanced through the right heart outflow into the main pulmonary artery. Catheter was exchanged for a Kumpe catheter. Kumpe catheter was directed to the left pulmonary artery. Over the [facility] guidewire, a second 10 cm infusion catheter was advanced into the left pulmonary artery. Position confirmed with contrast injection. Inner occlusion wire advanced. Catheter positions confirmed with fluoroscopy. Access site secured to the right neck with a sterile dressing. PE thrombo lysis protocol will be initiated through both catheters for the standard protocol infusion. FINDINGS: Bilateral pulmonary angiograms confirm occlusive central large clot burden in the  right hilum. Nonocclusive thrombus in the central left pulmonary arteries. IMPRESSION: Successful bilateral pulmonary artery catheterizations, angiograms, and insertion of lysis catheters to begin PE thrombo lysis protocol. Electronically Signed   By: Jerilynn Mages.  Shick M.D.   On: 02/07/2019 20:48   IR INFUSION THROMBOL ARTERIAL INITIAL (MS)  Result Date: 02/06/2019 INDICATION: Acute bilateral occlusive pulmonary emboli EXAM: Ultrasound guidance for vascular access Bilateral pulmonary artery catheterizations, angiograms, and insertion of infusion catheters for PE thrombolysis COMPARISON:  11/23/2018 MEDICATIONS: Patient is currently on ECMO ANESTHESIA/SEDATION: The patient was continuously monitored during the procedure by the ECMO team FLUOROSCOPY TIME:  Fluoroscopy  Time: 14 minutes 12 seconds (822 mGy). COMPLICATIONS: None immediate. TECHNIQUE: Informed written consent was obtained from the patient's family after a thorough discussion of the procedural risks, benefits and alternatives. All questions were addressed. Maximal Sterile Barrier Technique was utilized including caps, mask, sterile gowns, sterile gloves, sterile drape, hand hygiene and skin antiseptic. A timeout was performed prior to the initiation of the procedure. Under sterile conditions, the existing 8 French sheath in the right internal jugular vein was exchanged over 2 guidewires. Two adjacent 6 French sheaths were inserted. Initially, a 6 French angle pigtail catheter was advanced through 1 of the 6 French sheaths into the right heart. This was advanced through the right heart outflow into the pulmonary arteries and initially position of the right pulmonary artery. Pulmonary angiogram performed. Initial pulmonary angiogram: Occlusive filling defects noted in the proximal right pulmonary artery. Nonocclusive filling defects in the left pulmonary artery. Over Rosen guidewire, a 10 cm infusion catheter was advanced into the right pulmonary artery  extending into the lower lobe branches. Contrast injection confirms position. Inner occlusion wire advanced. Position confirmed with fluoroscopy. In a similar fashion through the second right IJ adjacent 6 French sheath, the angled 6 French pigtail catheter was advanced and positioned in the right heart initially. This was advanced through the right heart outflow into the main pulmonary artery. Catheter was exchanged for a Kumpe catheter. Kumpe catheter was directed to the left pulmonary artery. Over the [facility] guidewire, a second 10 cm infusion catheter was advanced into the left pulmonary artery. Position confirmed with contrast injection. Inner occlusion wire advanced. Catheter positions confirmed with fluoroscopy. Access site secured to the right neck with a sterile dressing. PE thrombo lysis protocol will be initiated through both catheters for the standard protocol infusion. FINDINGS: Bilateral pulmonary angiograms confirm occlusive central large clot burden in the right hilum. Nonocclusive thrombus in the central left pulmonary arteries. IMPRESSION: Successful bilateral pulmonary artery catheterizations, angiograms, and insertion of lysis catheters to begin PE thrombo lysis protocol. Electronically Signed   By: Jerilynn Mages.  Shick M.D.   On: 02/06/2019 20:48   IR THROMB F/U EVAL ART/VEN FINAL DAY (MS)  Result Date: 01/27/2019 INDICATION: 68 year old male with a history pulmonary embolism, finishing his second day catheter thrombolysis EXAM: FOLLOW-UP PE LYSIS COMPARISON:  01/26/2019 MEDICATIONS: None ANESTHESIA/SEDATION: None FLUOROSCOPY TIME:  None COMPLICATIONS: None TECHNIQUE: Catheters were removed at the bedside in the ICU. No images were acquired. Pressures were not transduce to, status post repeat echo today. FINDINGS: Catheters removed in their entirety. IMPRESSION: Follow-up PE lysis with removal of the bilateral pulmonary artery lysis catheters at the bedside. Electronically Signed   By: Corrie Mckusick  D.O.   On: 01/27/2019 16:48   IR THROMB F/U EVAL ART/VEN FINAL DAY (MS)  Result Date: 01/25/2019 INDICATION: Massive pulmonary embolism requiring 2 doses of systemic tPA, ultimately placed on ECMO, followed by initiation of bilateral pulmonary arterial catheter directed thrombolysis. EXAM: IR THROMB F/U EVAL ART/VEN FINAL DAY COMPARISON:  Initiation of bilateral pulmonary arterial catheter directed thrombolysis-01/25/2019; chest CT-02/09/2019 MEDICATIONS: None ANESTHESIA/SEDATION: None CONTRAST:  None COMPLICATIONS: None immediate. PROCEDURE: Pressure measurements were NOT acquired at the time of initiation of bilateral pulmonary arterial catheter directed thrombolysis as the patient was on ECMO. As such, prolonged conversations were held with the ICU staff regarding continuing bilateral pulmonary arterial directed thrombolysis for a time period greater than the standard 12 hours versus terminating the procedure. Following this prolonged detailed conversation, the decision was made to terminate the procedure.  As pressure measurements were not obtained at the initiation of the procedure, postprocedural pressure make sense for also not obtained. All wires and catheters removed from the right jugular access site. One of the two vascular sheaths was left in place for continued durable intravenous access per request from the ICU staff. IMPRESSION: Technically successful completion of 12 hour bilateral catheter directed thrombolysis for massive pulmonary embolism. Electronically Signed   By: Sandi Mariscal M.D.   On: 01/25/2019 15:43    PERFORMANCE STATUS (ECOG) : 4 - Bedbound  Review of Systems Unable to complete  Physical Exam General: Obese, critically ill appearing Cardiovascular: regular rate Pulmonary: unlabored, on vent Abdomen: rotund GU: Foley Extremities: edema Skin: no rashes Neurological: sedated  IMPRESSION: Patient remains in ICU on ventilator and ECMO.  He also remains on pressors.  He  has had worsening anemia without overt signs of bleeding.  He is status post transfusion.  I met today with patient's wife.  We had a lengthy conversation regarding goals.  She seems to be realistic regarding patient's overall prognosis and she recognizes that he might not survive this illness.  Patient does have a living will, which she will bring to the hospital to be scanned into the chart.  She and patient have previously discussed his wishes for end-of-life care.  She says she is struggling some with the decision to resuscitate and place him on a ventilator/ECMO, as she feels patient would not have wanted the level of care that he is currently receiving.  Wife says that patient would likely only be accepting of care if there was a high chance of meaningful improvement with recovery to his previous level of functioning.  Patient would not be accepting of care that left him debilitated or requiring care in a nursing facility.  She plans to speak with her children about future decision making but she remains committed to the current treatment plan as long as patient is demonstrating even small signs of improvement.    Emotional support provided to patient's wife.  We will continue to follow.  PLAN: -Continue current scope of treatment -Wife to bring in patient's living will -Will continue to follow and support family through future decision making   Time Total: 60 minutes  Visit consisted of counseling and education dealing with the complex and emotionally intense issues of symptom management and palliative care in the setting of serious and potentially life-threatening illness.Greater than 50%  of this time was spent counseling and coordinating care related to the above assessment and plan.  Signed by: Altha Harm, PhD, NP-C

## 2019-01-28 NOTE — Progress Notes (Addendum)
Patient ID: Philip Wolfe, male   DOB: 01/22/1951, 68 y.o.   MRN: [id number]     Advanced Heart Failure Rounding Note  PCP-Cardiologist: No primary care provider on file.   Subjective:    - Massive PE with cardiogenic shock: [facility] ECMO initiated 12/8. Patient then had PA catheter-directed tPA until 8 am 12/9.   - 12/9 ENT Merocel packing for severe epistaxis.  - PA catheter-directed tPA 12/20 again.    Nasopharyngeal bleeding has slowed after nasal packing by ENT.   Hgb down to 7, has had 2 units PRBCs overnight, no obvious bleeding.  Plts up to 65K.   Slowly weaning pressors.  Now off epinephrine and on norepinephrine 24, vasopressin 0.04.  MAP 80s.  CVP 10 on Lasix gtt 8 mg/hr, net positive 3L and increased anasarca.  Heparin gtt ongoing with therapeutic level. iNO at 30 ppm. Co-ox 77% this morning. LDH 370. Echo yesterday difficult windows but RV with some improvement.   Empiric vancomycin/cefepime ongoing, afebrile, cultures remain negative.  WBCs high 39 => 32.  Sedated on Versed/Fentanyl.     ECMO: Stable, no chugging.  Flow 4.4 L/min  3600 rpm Venous pressure -92 Delta P 22  Objective:   Weight Range: (!) 153.4 kg Body mass index is 49.92 kg/m.   Vital Signs:   Temp:  [97.7 F (36.5 C)-98.1 F (36.7 C)] 97.7 F (36.5 C) (12/12 0600) Pulse Rate:  [70-91] 82 (12/12 0545) Resp:  [0-29] 20 (12/12 0600) BP: (91-115)/(65-74) 107/72 (12/12 0500) SpO2:  [90 %-100 %] 100 % (12/12 0545) Arterial Line BP: (68-123)/(57-83) 123/83 (12/12 0600) FiO2 (%):  [40 %] 40 % (12/12 0413) Weight:  [153.4 kg] 153.4 kg (12/12 0600) Last BM Date: (PTA)  Weight change: Filed Weights   01/26/19 0344 01/27/19 0600 01/28/19 0600  Weight: (!) 152.2 kg (!) 150.2 kg (!) 153.4 kg    Intake/Output:   Intake/Output Summary (Last 24 hours) at 01/28/2019 0738 Last data filed at 01/28/2019 0700 Gross per 24 hour  Intake 6939.99 ml  Output 3610 ml  Net 3329.99 ml      Physical Exam     General: Intubated/sedated Neck: Thick, cannot assess JVP with lines, no thyromegaly or thyroid nodule.  Lungs: Decreased at bases. CV: Nondisplaced PMI.  Heart regular S1/S2, no S3/S4.  Anasarca.   Abdomen: Soft, nontender, no hepatosplenomegaly, no distention.  Skin: Intact without lesions or rashes.  Neurologic: Sedated Extremities: No clubbing or cyanosis.  HEENT: Normal.    Telemetry   NSR 80s (personally reviewed)  Labs    CBC Recent Labs    01/28/19 0051 01/28/19 0416 01/28/19 0428  WBC 32.2*  --  32.1*  HGB 7.7* 6.5* 7.0*  HCT 22.0* 19.0* 20.6*  MCV 90.5  --  90.7  PLT 67*  --  65*   Basic Metabolic Panel Recent Labs    01/27/19 1545 01/28/19 0416 01/28/19 0428  NA 141 140 142  K 4.0 3.7 3.8  CL 105  --  107  CO2 27  --  26  GLUCOSE 164*  --  171*  BUN 45*  --  50*  CREATININE 1.44*  --  1.70*  CALCIUM 6.4*  --  6.2*  MG 2.2  --  2.3  PHOS 3.3  --  2.7   Liver Function Tests Recent Labs    01/27/19 1545 01/28/19 0428  AST 42* 42*  ALT 26 22  ALKPHOS 46 43  BILITOT 1.0 0.9  PROT 3.6* 3.3*  ALBUMIN 1.8* 1.6*   No results for input(s): LIPASE, AMYLASE in the last 72 hours. Cardiac Enzymes No results for input(s): CKTOTAL, CKMB, CKMBINDEX, TROPONINI in the last 72 hours.  BNP: BNP (last 3 results) Recent Labs    01/31/2019 1505  BNP 57.6    ProBNP (last 3 results) No results for input(s): PROBNP in the last 8760 hours.   D-Dimer No results for input(s): DDIMER in the last 72 hours. Hemoglobin A1C No results for input(s): HGBA1C in the last 72 hours. Fasting Lipid Panel No results for input(s): CHOL, HDL, LDLCALC, TRIG, CHOLHDL, LDLDIRECT in the last 72 hours. Thyroid Function Tests No results for input(s): TSH, T4TOTAL, T3FREE, THYROIDAB in the last 72 hours.  Invalid input(s): FREET3  Other results:   Imaging    DG Abd 1 View  Result Date: 01/27/2019 CLINICAL DATA:  Nasogastric tube placement EXAM: ABDOMEN - 1 VIEW  COMPARISON:  Chest radiograph of earlier today FINDINGS: Single supine view of the left upper quadrant. Left pleural fluid and base airspace disease are suboptimally evaluated. Nasogastric tube is incompletely imaged, but terminates over the gastric body. Status post lap band procedure. IMPRESSION: Nasogastric terminating over the body of the stomach. Limited left upper quadrant radiograph. Electronically Signed   By: Abigail Miyamoto M.D.   On: 01/27/2019 15:19   VAS [place] LOWER EXTREMITY VENOUS (DVT)  Result Date: 01/27/2019  Lower Venous Study Indications: Pulmonary embolism, and On an ECMO machine.  Comparison Study: No priors. Performing Technologist: Oda Cogan RDMS, RVT  Examination Guidelines: A complete evaluation includes B-mode imaging, spectral Doppler, color Doppler, and power Doppler as needed of all accessible portions of each vessel. Bilateral testing is considered an integral part of a complete examination. Limited examinations for reoccurring indications may be performed as noted.  +---------+---------------+---------+-----------+----------+--------------+ RIGHT    CompressibilityPhasicitySpontaneityPropertiesThrombus Aging +---------+---------------+---------+-----------+----------+--------------+ CFV      Full           Yes      Yes                                 +---------+---------------+---------+-----------+----------+--------------+ SFJ      Full                                                        +---------+---------------+---------+-----------+----------+--------------+ FV Prox  Full                                                        +---------+---------------+---------+-----------+----------+--------------+ FV Mid   Full                                                        +---------+---------------+---------+-----------+----------+--------------+ FV DistalFull                                                         +---------+---------------+---------+-----------+----------+--------------+  PFV      Full                                                        +---------+---------------+---------+-----------+----------+--------------+ POP      Full           Yes      Yes                                 +---------+---------------+---------+-----------+----------+--------------+ PTV      Full                                                        +---------+---------------+---------+-----------+----------+--------------+ PERO     Full                                                        +---------+---------------+---------+-----------+----------+--------------+   +---------+---------------+---------+-----------+----------+-----------------+ LEFT     CompressibilityPhasicitySpontaneityPropertiesThrombus Aging    +---------+---------------+---------+-----------+----------+-----------------+ CFV      Full           Yes      Yes                                    +---------+---------------+---------+-----------+----------+-----------------+ SFJ      Full                                                           +---------+---------------+---------+-----------+----------+-----------------+ FV Prox  Full                                                           +---------+---------------+---------+-----------+----------+-----------------+ FV Mid   Full                                                           +---------+---------------+---------+-----------+----------+-----------------+ FV DistalFull                                                           +---------+---------------+---------+-----------+----------+-----------------+ PFV      Full                                                           +---------+---------------+---------+-----------+----------+-----------------+  POP      None           No       No                   Age Indeterminate  +---------+---------------+---------+-----------+----------+-----------------+ PTV      Partial                                      Age Indeterminate +---------+---------------+---------+-----------+----------+-----------------+ PERO     Partial                                      Age Indeterminate +---------+---------------+---------+-----------+----------+-----------------+     Summary: Right: There is no evidence of deep vein thrombosis in the lower extremity. Left: Findings consistent with age indeterminate deep vein thrombosis involving the left popliteal vein, left posterior tibial veins, and left peroneal veins.  *See table(s) above for measurements and observations.    Preliminary    ECHOCARDIOGRAM LIMITED  Result Date: 01/27/2019   ECHOCARDIOGRAM LIMITED REPORT   Patient Name:   Philip Wolfe Date of Exam: 01/27/2019 Medical Rec #:  [id number]     Height:       69.0 in Accession #:    [id number]    Weight:       331.1 lb Date of Birth:  08-Apr-1950      BSA:          2.56 m Patient Age:    73 years      BP:           87/64 mmHg Patient Gender: M             HR:           80 bpm. Exam Location:  Inpatient  Procedure: 2D Echo, Color Doppler and Intracardiac Opacification Agent Indications:    G40.10 Acute diastolic (congestive) heart failure  History:        Patient has prior history of Echocardiogram examinations, most                 recent 01/26/2019. Active ECMO at time of study.  Sonographer:    Raquel Sarna Senior RDCS Referring Phys: [place] Comments: Technically difficult study due to poor echo windows and patient is morbidly obese. Contrast approved by Dr. Aundra Dubin IMPRESSIONS  1. Left ventricular ejection fraction, by visual estimation, is 50 to 55%. The left ventricle has normal function. There is severely increased left ventricular hypertrophy.  2. Left ventricular diastolic function could not be evaluated.  3. Global right ventricle was not well visualized.The  right ventricular size is not well visualized. Right vetricular wall thickness was not assessed.  4. Left atrial size was not well visualized.  5. Right atrial size was not well visualized.  6. The pericardium was not well visualized.  7. The mitral valve was not well visualized. not assessed mitral valve regurgitation.  8. The tricuspid valve is not well visualized. Tricuspid valve regurgitation not assessed.  9. Aortic valve regurgitation not assessed. 10. The aortic valve was not well visualized. Aortic valve regurgitation not assessed. 11. The pulmonic valve was not well visualized. Pulmonic valve regurgitation not assessed. 12. The aortic root was not well visualized. 13. Extremely technically difficult echo with limited and poor image quality. 14. The interatrial  septum was not well visualized. FINDINGS  Left Ventricle: Left ventricular ejection fraction, by visual estimation, is 50 to 55%. The left ventricle has normal function. The left ventricle is not well visualized. There is severely increased left ventricular hypertrophy. The left ventricular diastology could not be evaluated due to nondiagnostic images. Left ventricular diastolic function could not be evaluated. Right Ventricle: The right ventricular size is not well visualized. Right vetricular wall thickness was not assessed. Global RV systolic function is was not well visualized. Left Atrium: Left atrial size was not well visualized. Right Atrium: Right atrial size was not well visualized Pericardium: The pericardium was not well visualized. Mitral Valve: The mitral valve was not well visualized. Not assessed mitral valve regurgitation. Tricuspid Valve: The tricuspid valve is not well visualized. Tricuspid valve regurgitation not assessed. Aortic Valve: The aortic valve was not well visualized. Aortic valve regurgitation not assessed. Pulmonic Valve: The pulmonic valve was not well visualized. Pulmonic valve regurgitation not assessed. Aorta: The  aortic root was not well visualized. Shunts: The interatrial septum was not well visualized. Additional Comments: Extremely technically difficult echo with limited and poor image quality.  Mertie Moores MD Electronically signed by Mertie Moores MD Signature Date/Time: 01/27/2019/11:17:10 AM    Final    IR THROMB F/U EVAL ART/VEN FINAL DAY (MS)  Result Date: 01/27/2019 INDICATION: 68 year old male with a history pulmonary embolism, finishing his second day catheter thrombolysis EXAM: FOLLOW-UP PE LYSIS COMPARISON:  01/26/2019 MEDICATIONS: None ANESTHESIA/SEDATION: None FLUOROSCOPY TIME:  None COMPLICATIONS: None TECHNIQUE: Catheters were removed at the bedside in the ICU. No images were acquired. Pressures were not transduce to, status post repeat echo today. FINDINGS: Catheters removed in their entirety. IMPRESSION: Follow-up PE lysis with removal of the bilateral pulmonary artery lysis catheters at the bedside. Electronically Signed   By: Corrie Mckusick D.O.   On: 01/27/2019 16:48     Medications:     Scheduled Medications: . sodium chloride   Intravenous Once  . acetaminophen  1,000 mg Oral Q6H   Or  . acetaminophen (TYLENOL) oral liquid 160 mg/5 mL  1,000 mg Per Tube Q6H  . artificial tears  1 application Both Eyes H8E  . bisacodyl  10 mg Oral Daily   Or  . bisacodyl  10 mg Rectal Daily  . chlorhexidine gluconate (MEDLINE KIT)  15 mL Mouth Rinse BID  . Chlorhexidine Gluconate Cloth  6 each Topical Daily  . docusate sodium  200 mg Oral Daily  . feeding supplement (PRO-STAT SUGAR FREE 64)  30 mL Per Tube TID  . mouth rinse  15 mL Mouth Rinse 10 times per day  . metoCLOPramide (REGLAN) injection  10 mg Intravenous Q6H  . sodium chloride flush  10-40 mL Intracatheter Q12H  . sodium chloride flush  3 mL Intravenous Q12H    Infusions: . ceFEPime (MAXIPIME) IV Stopped (01/28/19 0234)  . cisatracurium (NIMBEX) infusion Stopped (01/27/19 1057)  . dexmedetomidine (PRECEDEX) IV infusion  Stopped (01/26/19 1143)  . epinephrine Stopped (01/28/19 9937)  . famotidine (PEPCID) IV Stopped (01/27/19 2256)  . feeding supplement (VITAL 1.5 CAL) 20 mL/hr at 01/27/19 2000  . fentaNYL infusion INTRAVENOUS 375 mcg/hr (01/28/19 0700)  . furosemide (LASIX) infusion 4 mg/hr (01/28/19 0700)  . heparin 1,500 Units/hr (01/28/19 0700)  . insulin 4.2 mL/hr at 01/28/19 0700  . lactated ringers 10 mL/hr at 01/25/19 1031  . lactated ringers Stopped (02/16/2019 2159)  . midazolam 10 mg/hr (01/28/19 0700)  . norepinephrine (LEVOPHED) Adult infusion 24 mcg/min (  01/28/19 0700)  . vancomycin Stopped (01/27/19 2027)  . vasopressin (PITRESSIN) infusion - *FOR SHOCK* 0.04 Units/min (01/28/19 0700)    PRN Medications: bisacodyl, calcium chloride, dextrose, fentaNYL, fentaNYL, metoprolol tartrate, midazolam, midazolam, morphine injection, ondansetron (ZOFRAN) IV, oxyCODONE, sodium chloride flush, sodium chloride flush, traMADol   Assessment/Plan   1.  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  [facility] ECMO initiated 12/8.  Currently good flow (4-5 L/min range) at 3600 rpm.  Still with significant pressor requirement despite catheter-directed tPA x 2 now.  He remains on norepinephrine 24 and vasopressin 0.03, off epinephrine.  Echo from yesterday reviewed, RV with some improvement.  MAP 80s with CVP 10. Co-ox 76%. Now on Lasix gtt at 4 mg/hr with net positive I/Os.  Creatinine mildly higher at 1.7. - Continue to titrate down pressor support as able => will try to get off vasopressin today.  - Decrease iNO from 30 => 20 ppm with rise in creatinine.   - Limited echo today to reassess his RV.   - If we can wean down pressor support further today and RV looks stable by echo, may be able to begin weaning ECMO support soon.  - Increase Lasix gtt to 8 mg/hr with anasarca and CVP 10.  2. AKI: Creatinine mildly higher today at 1.7, follow closely.     3. Acute hypoxic/hypoxemic  respiratory failure: FiO2 0.4, improved ABG on ECMO.  4. ID: WBCs elevated but afebrile.  No PNA on CXR.  PCT not elevated initially.  Blood and trach aspirate cultures so far negative.  - Empiric coverage with cefepime and vancomycin.  5. Pulmonary embolus: Massive bilateral PE.  He got 150 mg IV total tPA initially, then catheter directed tPA to both PAs on 12/9 and again on 12/10.  Discussed surgical thrombectomy but with improvement in RV appearance by echo, holding off for now.   - Continue to review with cardiac surgeons.    - Continue heparin gtt.  6. ENT: ENT bleeding, has had nasal packing by ENT.  7. Acute blood loss anemia: ENT bleeding initially but this has slowed.  Not certain of current source of blood loss, no overt GI bleeding and LDH is stable.  Transfuse hgb < 8. He had 2 more units PRBCs last night.   8. Thrombocytopenia: Plts higher at 65 K this morning.  Suspect due to critical illness.  - Transfuse plts < 50K.  9. FEN: Trickle feeds.  10. Neuro: Lighten sedation to see if we can wake up this morning.   CRITICAL CARE Performed by: Loralie Champagne  Total critical care time: 45 minutes  Critical care time was exclusive of separately billable procedures and treating other patients.  Critical care was necessary to treat or prevent imminent or life-threatening deterioration.  Critical care was time spent personally by me on the following activities: development of treatment plan with patient and/or surrogate as well as nursing, discussions with consultants, evaluation of patient's response to treatment, examination of patient, obtaining history from patient or surrogate, ordering and performing treatments and interventions, ordering and review of laboratory studies, ordering and review of radiographic studies, pulse oximetry and re-evaluation of patient's condition.   Length of Stay: [place], MD  01/28/2019, 7:38 AM  Advanced Heart Failure Team Pager [phone]  (M-F; 7a - 4p)  Please contact [facility] for night-coverage after hours (4p -7a ) and weekends on [email or web address]

## 2019-01-28 NOTE — Progress Notes (Signed)
Referring Physician(s): Dr Denese Killings  Supervising Physician: Gilmer Mor  Patient Status:  [facility] - In-pt  Chief Complaint:  Pulmonary embolus: Massive bilateral PE  Subjective:  PE Lysis in IR 12/8 and 12/10 IMPRESSION:  1. Pulmonary arteriography demonstrates bilateral pulmonary emboli.  Slightly improved flow to the right pulmonary arteries compared to  the examination on 03-Feb-2019. There continues to be a large clot  burden in the distal right pulmonary artery and no significant flow  into the right upper lobe branches.    Pt intubated/sedated  Allergies: Patient has no known allergies.  Medications: Prior to Admission medications   Medication Sig Start Date End Date Taking? Authorizing Provider  amLODipine (NORVASC) 5 MG tablet Take 5 mg by mouth daily. 11/24/18  Yes [provider]  fish oil-omega-3 fatty acids 1000 MG capsule Take 1 g by mouth daily.     Yes [provider]  losartan-hydrochlorothiazide (HYZAAR) 100-25 MG tablet Take 1 tablet by mouth daily. 10/31/18  Yes [provider]  meloxicam (MOBIC) 7.5 MG tablet Take 7.5 mg by mouth daily. 01/16/19  Yes [provider]  nebivolol (BYSTOLIC) 10 MG tablet Take 10 mg by mouth daily.     Yes [provider]  spironolactone (ALDACTONE) 100 MG tablet Take 100 mg by mouth daily. 12/11/18  Yes [provider]     Vital Signs: BP 107/72   Pulse 82   Temp 97.7 F (36.5 C)   Resp 20   Ht 5' 9.02" (1.753 m)   Wt (!) 338 lb 3 oz (153.4 kg)   SpO2 100%   BMI 49.92 kg/m   Physical Exam Vitals reviewed.  Skin:    General: Skin is warm and dry.     Comments: Site of Rt IJ clean and dry      Imaging: DG Abd 1 View  Result Date: 01/27/2019 CLINICAL DATA:  Nasogastric tube placement EXAM: ABDOMEN - 1 VIEW COMPARISON:  Chest radiograph of earlier today FINDINGS: Single supine view of the left upper quadrant. Left pleural fluid and base airspace disease  are suboptimally evaluated. Nasogastric tube is incompletely imaged, but terminates over the gastric body. Status post lap band procedure. IMPRESSION: Nasogastric terminating over the body of the stomach. Limited left upper quadrant radiograph. Electronically Signed   By: Jeronimo Greaves M.D.   On: 01/27/2019 15:19   CT ANGIO CHEST PE W OR WO CONTRAST  Result Date: 01/26/2019 CLINICAL DATA:  Known recently diagnosed pulmonary embolism on anticoagulant treatment. Follow-up. EXAM: CT ANGIOGRAPHY CHEST WITH CONTRAST TECHNIQUE: Multidetector CT imaging of the chest was performed using the standard protocol during bolus administration of intravenous contrast. Multiplanar CT image reconstructions and MIPs were obtained to evaluate the vascular anatomy. CONTRAST:  80mL OMNIPAQUE IOHEXOL 350 MG/ML SOLN COMPARISON:  02/09/2019 FINDINGS: Cardiovascular: Bilateral IJ venous lines present. Mild cardiomegaly. Mild calcified plaque over the 3 vessel coronary arteries. Ectasia of the ascending thoracic aorta measuring 3.5 cm unchanged. Pulmonary arterial system demonstrates evidence of patient's bilateral pulmonary emboli with slight interval over the left lower lobar arteries. Also improvement over the right main pulmonary artery. Persistent moderate burden of emboli over the proximal right upper, middle and lower lobar pulmonary arteries with minimal interval improvement. Evidence of persistent right heart strain (RV/LV is 34.31/27.10 =1.26). Mediastinum/Nodes: No significant mediastinal or hilar adenopathy. Lungs/Pleura: Endotracheal tube has tip approximately 1.4 cm above the carina. Interval development of small bilateral pleural effusions with associated moderate bibasilar compressive atelectasis. Persistent hazy opacification over  the left perihilar region with slight interval improvement. New mild patchy peripheral opacification over the anterior right upper lobe. Complete opacification over the proximal lobar bronchi  with complete obstruction of the right lower lobe bronchus. Upper Abdomen: Catheter over the IVC extending through the right heart with tip over the SVC just above the superior cavoatrial junction. 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 Mild cholelithiasis. Musculoskeletal: Degenerative change of the spine. Review of the MIP images confirms the above findings. IMPRESSION: 1. Evidence of patient's known moderate bilateral pulmonary emboli right worse than left with mild overall improvement as described. Evidence of persistent right heart strain. 2. New small bilateral pleural effusions with associated moderate compressive atelectasis in the lung bases. Opacification of the proximal right bronchi with obstruction of the right lower lobe bronchus. 3. Slight interval improvement of hazy airspace opacification over the left perihilar region which may be due to asymmetric edema or infection. New mild peripheral opacification over the anterior right upper lobe which may be due to atelectasis or infection. 4.  Tubes and lines as described. 5. Stable ectasia of the ascending thoracic aorta measuring 3.5 cm in AP diameter. Recommend annual imaging followup by CTA or MRA. This recommendation follows 2010 ACCF/AHA/AATS/ACR/ASA/SCA/SCAI/SIR/STS/SVM Guidelines for the Diagnosis and Management of Patients with Thoracic Aortic Disease. Circulation.2010; 121: Z610-R604. Aortic aneurysm NOS (ICD10-I71.9). 6.  Atherosclerotic coronary artery disease. 7.  Cholelithiasis. Electronically Signed   By: Elberta Fortis M.D.   On: 01/26/2019 10:40   IR Angiogram Pulmonary Bilateral Selective  Result Date: 01/26/2019 INDICATION: 68 year old with history of massive pulmonary emboli and cardiogenic shock. Patient has already undergone systemic tPA and previous 12 hour catheter directed pulmonary artery thrombolysis. Patient is currently on ECMO and continues to have a  large clot burden in the pulmonary arteries. EXAM: BILATERAL PULMONARY ARTERIOGRAMS; SELECTION OF BILATERAL PULMONARY ARTERIES PLACEMENT OF BILATERAL PULMONARY ARTERY INFUSION CATHETERS ULTRASOUND GUIDANCE FOR VASCULAR ACCESS COMPARISON:  Feb 09, 2019 MEDICATIONS: None. ANESTHESIA/SEDATION: Intubated and on ECMO. FLUOROSCOPY TIME:  Fluoroscopy Time: 23 minutes, 30 seconds, 1015 mGy COMPLICATIONS: None TECHNIQUE: Informed consent was obtained from the patient's wife. Maximal Sterile Barrier Technique was utilized including caps, mask, sterile gowns, sterile gloves, sterile drape, hand hygiene and skin antiseptic. A timeout was performed prior to the initiation of the procedure. Right neck was prepped and draped in sterile fashion. Patient already had a 6 French right jugular sheath in place. Ultrasound confirmed a patent right internal jugular vein. 21 gauge needle was directed into the right internal jugular vein and micropuncture dilator set was placed. A second 6 French vascular sheath was placed. The other 6 [place] vascular sheath was exchanged due to poor function. An angled 6 French pigtail catheter was advanced through the heart into the right pulmonary artery. Right pulmonary arteriogram was performed. A Rosen wire was placed. Pigtail catheter was placed through the other 6 French vascular sheath and advanced into the main pulmonary artery and left pulmonary artery. Catheter was exchanged for a Kumpe catheter with a Bentson wire. Kumpe catheter was advanced into left pulmonary arteries and selective left pulmonary arteriograms were performed. Catheter was advanced into a lower lobe branch and removed over a Rosen wire. A 90 cm, 10 cm infusion length UniFuse catheter was advanced over the wire and positioned in the left lower lobe branch. Attention was directed to placement of the right pulmonary artery catheter. Kumpe catheter was advanced into right pulmonary artery branches and selective angiography was  performed. Eventually, catheter was advanced into the right upper trunk that was completely occluded on the arteriogram. A 90 cm x 15 cm infusion length UniFuse catheter was placed into the right upper trunk. Both sheaths were secured to the skin with suture. FINDINGS: There continues to be a large amount of clot in the distal right pulmonary artery extending into the upper and lower trunks. Compared to the previous examination, there is now some flow into the right lower lobe branches. There continues to be no flow into the right upper lobe branches. Right pulmonary infusion catheter was placed within the right upper trunk. Segmental clot identified in the left pulmonary arteries. Left PA infusion catheter was placed within a left lower segmental branch. IMPRESSION: 1. Pulmonary arteriography demonstrates bilateral pulmonary emboli. Slightly improved flow to the right pulmonary arteries compared to the examination on 01/25/2019. There continues to be a large clot burden in the distal right pulmonary artery and no significant flow into the right upper lobe branches. 2. Successful placement of bilateral pulmonary artery infusion catheters. Initiation of catheter directed thrombolysis. Plan for 1 mg of tPA through in each catheter for total of 2 mg/hour. Plan for 12 hour treatment. Electronically Signed   By: Richarda Overlie M.D.   On: 01/26/2019 18:57   IR Angiogram Pulmonary Bilateral Selective  Result Date: 01/17/2019 INDICATION: Acute bilateral occlusive pulmonary emboli EXAM: Ultrasound guidance for vascular access Bilateral pulmonary artery catheterizations, angiograms, and insertion of infusion catheters for PE thrombolysis COMPARISON:  11/23/2018 MEDICATIONS: Patient is currently on ECMO ANESTHESIA/SEDATION: The patient was continuously monitored during the procedure by the ECMO team FLUOROSCOPY TIME:  Fluoroscopy Time: 14 minutes 12 seconds (822 mGy). COMPLICATIONS: None immediate. TECHNIQUE: Informed written  consent was obtained from the patient's family after a thorough discussion of the procedural risks, benefits and alternatives. All questions were addressed. Maximal Sterile Barrier Technique was utilized including caps, mask, sterile gowns, sterile gloves, sterile drape, hand hygiene and skin antiseptic. A timeout was performed prior to the initiation of the procedure. Under sterile conditions, the existing 8 French sheath in the right internal jugular vein was exchanged over 2 guidewires. Two adjacent 6 French sheaths were inserted. Initially, a 6 French angle pigtail catheter was advanced through 1 of the 6 French sheaths into the right heart. This was advanced through the right heart outflow into the pulmonary arteries and initially position of the right pulmonary artery. Pulmonary angiogram performed. Initial pulmonary angiogram: Occlusive filling defects noted in the proximal right pulmonary artery. Nonocclusive filling defects in the left pulmonary artery. Over Rosen guidewire, a 10 cm infusion catheter was advanced into the right pulmonary artery extending into the lower lobe branches. Contrast injection confirms position. Inner occlusion wire advanced. Position confirmed with fluoroscopy. In a similar fashion through the second right IJ adjacent 6 French sheath, the angled 6 French pigtail catheter was advanced and positioned in the right heart initially. This was advanced through the right heart outflow into the main pulmonary artery. Catheter was exchanged for a Kumpe catheter. Kumpe catheter was directed to the left pulmonary artery. Over the [facility] guidewire, a second 10 cm infusion catheter was advanced into the left pulmonary artery. Position confirmed with contrast injection. Inner occlusion wire advanced. Catheter positions confirmed with fluoroscopy. Access site secured to the right neck with a sterile dressing. PE thrombo lysis protocol will be initiated through both catheters for the standard  protocol infusion. FINDINGS: Bilateral pulmonary angiograms confirm occlusive central large clot burden in the right hilum.  Nonocclusive thrombus in the central left pulmonary arteries. IMPRESSION: Successful bilateral pulmonary artery catheterizations, angiograms, and insertion of lysis catheters to begin PE thrombo lysis protocol. Electronically Signed   By: Judie Petit.  Shick M.D.   On: 01/20/2019 20:48   IR Angiogram Selective Each Additional Vessel  Result Date: 01/26/2019 INDICATION: 68 year old with history of massive pulmonary emboli and cardiogenic shock. Patient has already undergone systemic tPA and previous 12 hour catheter directed pulmonary artery thrombolysis. Patient is currently on ECMO and continues to have a large clot burden in the pulmonary arteries. EXAM: BILATERAL PULMONARY ARTERIOGRAMS; SELECTION OF BILATERAL PULMONARY ARTERIES PLACEMENT OF BILATERAL PULMONARY ARTERY INFUSION CATHETERS ULTRASOUND GUIDANCE FOR VASCULAR ACCESS COMPARISON:  01/29/2019 MEDICATIONS: None. ANESTHESIA/SEDATION: Intubated and on ECMO. FLUOROSCOPY TIME:  Fluoroscopy Time: 23 minutes, 30 seconds, 1015 mGy COMPLICATIONS: None TECHNIQUE: Informed consent was obtained from the patient's wife. Maximal Sterile Barrier Technique was utilized including caps, mask, sterile gowns, sterile gloves, sterile drape, hand hygiene and skin antiseptic. A timeout was performed prior to the initiation of the procedure. Right neck was prepped and draped in sterile fashion. Patient already had a 6 French right jugular sheath in place. Ultrasound confirmed a patent right internal jugular vein. 21 gauge needle was directed into the right internal jugular vein and micropuncture dilator set was placed. A second 6 French vascular sheath was placed. The other 6 [place] vascular sheath was exchanged due to poor function. An angled 6 French pigtail catheter was advanced through the heart into the right pulmonary artery. Right pulmonary arteriogram was  performed. A Rosen wire was placed. Pigtail catheter was placed through the other 6 French vascular sheath and advanced into the main pulmonary artery and left pulmonary artery. Catheter was exchanged for a Kumpe catheter with a Bentson wire. Kumpe catheter was advanced into left pulmonary arteries and selective left pulmonary arteriograms were performed. Catheter was advanced into a lower lobe branch and removed over a Rosen wire. A 90 cm, 10 cm infusion length UniFuse catheter was advanced over the wire and positioned in the left lower lobe branch. Attention was directed to placement of the right pulmonary artery catheter. Kumpe catheter was advanced into right pulmonary artery branches and selective angiography was performed. Eventually, catheter was advanced into the right upper trunk that was completely occluded on the arteriogram. A 90 cm x 15 cm infusion length UniFuse catheter was placed into the right upper trunk. Both sheaths were secured to the skin with suture. FINDINGS: There continues to be a large amount of clot in the distal right pulmonary artery extending into the upper and lower trunks. Compared to the previous examination, there is now some flow into the right lower lobe branches. There continues to be no flow into the right upper lobe branches. Right pulmonary infusion catheter was placed within the right upper trunk. Segmental clot identified in the left pulmonary arteries. Left PA infusion catheter was placed within a left lower segmental branch. IMPRESSION: 1. Pulmonary arteriography demonstrates bilateral pulmonary emboli. Slightly improved flow to the right pulmonary arteries compared to the examination on 02/04/2019. There continues to be a large clot burden in the distal right pulmonary artery and no significant flow into the right upper lobe branches. 2. Successful placement of bilateral pulmonary artery infusion catheters. Initiation of catheter directed thrombolysis. Plan for 1 mg of  tPA through in each catheter for total of 2 mg/hour. Plan for 12 hour treatment. Electronically Signed   By: Richarda Overlie M.D.   On: 01/26/2019 18:57  IR Angiogram Selective Each Additional Vessel  Result Date: 01/26/2019 INDICATION: 68 year old with history of massive pulmonary emboli and cardiogenic shock. Patient has already undergone systemic tPA and previous 12 hour catheter directed pulmonary artery thrombolysis. Patient is currently on ECMO and continues to have a large clot burden in the pulmonary arteries. EXAM: BILATERAL PULMONARY ARTERIOGRAMS; SELECTION OF BILATERAL PULMONARY ARTERIES PLACEMENT OF BILATERAL PULMONARY ARTERY INFUSION CATHETERS ULTRASOUND GUIDANCE FOR VASCULAR ACCESS COMPARISON:  01/17/2019 MEDICATIONS: None. ANESTHESIA/SEDATION: Intubated and on ECMO. FLUOROSCOPY TIME:  Fluoroscopy Time: 23 minutes, 30 seconds, 7782 mGy COMPLICATIONS: None TECHNIQUE: Informed consent was obtained from the patient's wife. Maximal Sterile Barrier Technique was utilized including caps, mask, sterile gowns, sterile gloves, sterile drape, hand hygiene and skin antiseptic. A timeout was performed prior to the initiation of the procedure. Right neck was prepped and draped in sterile fashion. Patient already had a 6 French right jugular sheath in place. Ultrasound confirmed a patent right internal jugular vein. 21 gauge needle was directed into the right internal jugular vein and micropuncture dilator set was placed. A second 6 French vascular sheath was placed. The other 6 [place] vascular sheath was exchanged due to poor function. An angled 6 French pigtail catheter was advanced through the heart into the right pulmonary artery. Right pulmonary arteriogram was performed. A Rosen wire was placed. Pigtail catheter was placed through the other 6 French vascular sheath and advanced into the main pulmonary artery and left pulmonary artery. Catheter was exchanged for a Kumpe catheter with a Bentson wire. Kumpe  catheter was advanced into left pulmonary arteries and selective left pulmonary arteriograms were performed. Catheter was advanced into a lower lobe branch and removed over a Rosen wire. A 90 cm, 10 cm infusion length UniFuse catheter was advanced over the wire and positioned in the left lower lobe branch. Attention was directed to placement of the right pulmonary artery catheter. Kumpe catheter was advanced into right pulmonary artery branches and selective angiography was performed. Eventually, catheter was advanced into the right upper trunk that was completely occluded on the arteriogram. A 90 cm x 15 cm infusion length UniFuse catheter was placed into the right upper trunk. Both sheaths were secured to the skin with suture. FINDINGS: There continues to be a large amount of clot in the distal right pulmonary artery extending into the upper and lower trunks. Compared to the previous examination, there is now some flow into the right lower lobe branches. There continues to be no flow into the right upper lobe branches. Right pulmonary infusion catheter was placed within the right upper trunk. Segmental clot identified in the left pulmonary arteries. Left PA infusion catheter was placed within a left lower segmental branch. IMPRESSION: 1. Pulmonary arteriography demonstrates bilateral pulmonary emboli. Slightly improved flow to the right pulmonary arteries compared to the examination on 01/26/2019. There continues to be a large clot burden in the distal right pulmonary artery and no significant flow into the right upper lobe branches. 2. Successful placement of bilateral pulmonary artery infusion catheters. Initiation of catheter directed thrombolysis. Plan for 1 mg of tPA through in each catheter for total of 2 mg/hour. Plan for 12 hour treatment. Electronically Signed   By: Markus Daft M.D.   On: 01/26/2019 18:57   IR Angiogram Selective Each Additional Vessel  Result Date: 01/19/2019 INDICATION: Acute  bilateral occlusive pulmonary emboli EXAM: Ultrasound guidance for vascular access Bilateral pulmonary artery catheterizations, angiograms, and insertion of infusion catheters for PE thrombolysis COMPARISON:  11/23/2018 MEDICATIONS: Patient is  currently on ECMO ANESTHESIA/SEDATION: The patient was continuously monitored during the procedure by the ECMO team FLUOROSCOPY TIME:  Fluoroscopy Time: 14 minutes 12 seconds (822 mGy). COMPLICATIONS: None immediate. TECHNIQUE: Informed written consent was obtained from the patient's family after a thorough discussion of the procedural risks, benefits and alternatives. All questions were addressed. Maximal Sterile Barrier Technique was utilized including caps, mask, sterile gowns, sterile gloves, sterile drape, hand hygiene and skin antiseptic. A timeout was performed prior to the initiation of the procedure. Under sterile conditions, the existing 8 French sheath in the right internal jugular vein was exchanged over 2 guidewires. Two adjacent 6 French sheaths were inserted. Initially, a 6 French angle pigtail catheter was advanced through 1 of the 6 French sheaths into the right heart. This was advanced through the right heart outflow into the pulmonary arteries and initially position of the right pulmonary artery. Pulmonary angiogram performed. Initial pulmonary angiogram: Occlusive filling defects noted in the proximal right pulmonary artery. Nonocclusive filling defects in the left pulmonary artery. Over Rosen guidewire, a 10 cm infusion catheter was advanced into the right pulmonary artery extending into the lower lobe branches. Contrast injection confirms position. Inner occlusion wire advanced. Position confirmed with fluoroscopy. In a similar fashion through the second right IJ adjacent 6 French sheath, the angled 6 French pigtail catheter was advanced and positioned in the right heart initially. This was advanced through the right heart outflow into the main pulmonary  artery. Catheter was exchanged for a Kumpe catheter. Kumpe catheter was directed to the left pulmonary artery. Over the [facility] guide[facility], a second 10 cm infusion catheter was advanced into the left pulmonary artery. Position confirmed with contrast injection. Inner occlusion wire advanced. Catheter positions confirmed with fluoroscopy. Access site secured to the right neck with a sterile dressing. PE thrombo lysis protocol will be initiated through both catheters for the standard protocol infusion. FINDINGS: Bilateral pulmonary angiograms confirm occlusive central large clot burden in the right hilum. Nonocclusive thrombus in the central left pulmonary arteries. IMPRESSION: Successful bilateral pulmonary artery catheterizations, angiograms, and insertion of lysis catheters to begin PE thrombo lysis protocol. Electronically Signed   By: Judie PetitM.  Shick M.D.   On: 01/28/2019 20:48   IR Angiogram Selective Each Additional Vessel  Result Date: 02/02/2019 INDICATION: Acute bilateral occlusive pulmonary emboli EXAM: Ultrasound guidance for vascular access Bilateral pulmonary artery catheterizations, angiograms, and insertion of infusion catheters for PE thrombolysis COMPARISON:  11/23/2018 MEDICATIONS: Patient is currently on ECMO ANESTHESIA/SEDATION: The patient was continuously monitored during the procedure by the ECMO team FLUOROSCOPY TIME:  Fluoroscopy Time: 14 minutes 12 seconds (822 mGy). COMPLICATIONS: None immediate. TECHNIQUE: Informed written consent was obtained from the patient's family after a thorough discussion of the procedural risks, benefits and alternatives. All questions were addressed. Maximal Sterile Barrier Technique was utilized including caps, mask, sterile gowns, sterile gloves, sterile drape, hand hygiene and skin antiseptic. A timeout was performed prior to the initiation of the procedure. Under sterile conditions, the existing 8 French sheath in the right internal jugular vein was exchanged over  2 guidewires. Two adjacent 6 French sheaths were inserted. Initially, a 6 French angle pigtail catheter was advanced through 1 of the 6 French sheaths into the right heart. This was advanced through the right heart outflow into the pulmonary arteries and initially position of the right pulmonary artery. Pulmonary angiogram performed. Initial pulmonary angiogram: Occlusive filling defects noted in the proximal right pulmonary artery. Nonocclusive filling defects in the left pulmonary artery. Over Pollyann Kennedyosen  guidewire, a 10 cm infusion catheter was advanced into the right pulmonary artery extending into the lower lobe branches. Contrast injection confirms position. Inner occlusion wire advanced. Position confirmed with fluoroscopy. In a similar fashion through the second right IJ adjacent 6 French sheath, the angled 6 French pigtail catheter was advanced and positioned in the right heart initially. This was advanced through the right heart outflow into the main pulmonary artery. Catheter was exchanged for a Kumpe catheter. Kumpe catheter was directed to the left pulmonary artery. Over the [facility] guidewire, a second 10 cm infusion catheter was advanced into the left pulmonary artery. Position confirmed with contrast injection. Inner occlusion wire advanced. Catheter positions confirmed with fluoroscopy. Access site secured to the right neck with a sterile dressing. PE thrombo lysis protocol will be initiated through both catheters for the standard protocol infusion. FINDINGS: Bilateral pulmonary angiograms confirm occlusive central large clot burden in the right hilum. Nonocclusive thrombus in the central left pulmonary arteries. IMPRESSION: Successful bilateral pulmonary artery catheterizations, angiograms, and insertion of lysis catheters to begin PE thrombo lysis protocol. Electronically Signed   By: Judie Petit.  Shick M.D.   On: 02/08/2019 20:48   IR US Guide Vasc Access Right  Result Date: 01/26/2019 INDICATION: 68 year old  with history of massive pulmonary emboli and cardiogenic shock. Patient has already undergone systemic tPA and previous 12 hour catheter directed pulmonary artery thrombolysis. Patient is currently on ECMO and continues to have a large clot burden in the pulmonary arteries. EXAM: BILATERAL PULMONARY ARTERIOGRAMS; SELECTION OF BILATERAL PULMONARY ARTERIES PLACEMENT OF BILATERAL PULMONARY ARTERY INFUSION CATHETERS ULTRASOUND GUIDANCE FOR VASCULAR ACCESS COMPARISON:  01/29/2019 MEDICATIONS: None. ANESTHESIA/SEDATION: Intubated and on ECMO. FLUOROSCOPY TIME:  Fluoroscopy Time: 23 minutes, 30 seconds, 1015 mGy COMPLICATIONS: None TECHNIQUE: Informed consent was obtained from the patient's wife. Maximal Sterile Barrier Technique was utilized including caps, mask, sterile gowns, sterile gloves, sterile drape, hand hygiene and skin antiseptic. A timeout was performed prior to the initiation of the procedure. Right neck was prepped and draped in sterile fashion. Patient already had a 6 French right jugular sheath in place. Ultrasound confirmed a patent right internal jugular vein. 21 gauge needle was directed into the right internal jugular vein and micropuncture dilator set was placed. A second 6 French vascular sheath was placed. The other 6 [place] vascular sheath was exchanged due to poor function. An angled 6 French pigtail catheter was advanced through the heart into the right pulmonary artery. Right pulmonary arteriogram was performed. A Rosen wire was placed. Pigtail catheter was placed through the other 6 French vascular sheath and advanced into the main pulmonary artery and left pulmonary artery. Catheter was exchanged for a Kumpe catheter with a Bentson wire. Kumpe catheter was advanced into left pulmonary arteries and selective left pulmonary arteriograms were performed. Catheter was advanced into a lower lobe branch and removed over a Rosen wire. A 90 cm, 10 cm infusion length UniFuse catheter was advanced over  the wire and positioned in the left lower lobe branch. Attention was directed to placement of the right pulmonary artery catheter. Kumpe catheter was advanced into right pulmonary artery branches and selective angiography was performed. Eventually, catheter was advanced into the right upper trunk that was completely occluded on the arteriogram. A 90 cm x 15 cm infusion length UniFuse catheter was placed into the right upper trunk. Both sheaths were secured to the skin with suture. FINDINGS: There continues to be a large amount of clot in the distal right pulmonary artery extending into  the upper and lower trunks. Compared to the previous examination, there is now some flow into the right lower lobe branches. There continues to be no flow into the right upper lobe branches. Right pulmonary infusion catheter was placed within the right upper trunk. Segmental clot identified in the left pulmonary arteries. Left PA infusion catheter was placed within a left lower segmental branch. IMPRESSION: 1. Pulmonary arteriography demonstrates bilateral pulmonary emboli. Slightly improved flow to the right pulmonary arteries compared to the examination on February 11, 2019. There continues to be a large clot burden in the distal right pulmonary artery and no significant flow into the right upper lobe branches. 2. Successful placement of bilateral pulmonary artery infusion catheters. Initiation of catheter directed thrombolysis. Plan for 1 mg of tPA through in each catheter for total of 2 mg/hour. Plan for 12 hour treatment. Electronically Signed   By: Richarda Overlie M.D.   On: 01/26/2019 18:57   DG Chest Port 1 View  Result Date: 01/27/2019 CLINICAL DATA:  68 year old male with respiratory failure on [facility]. Bilateral pulmonary emboli. EXAM: PORTABLE CHEST 1 VIEW COMPARISON:  01/26/2019 CTA chest, portable chest and earlier. FINDINGS: Portable AP semi upright view at 0549 hours. Stable endotracheal tube tip at the level the clavicles.  Stable left IJ approach central line. There is an inferior approach vascular catheter again projecting through the right atrium to the cavoatrial junction. Chronic right axillary vascular graft. Bilateral lower lobe consolidation as demonstrated by CTA yesterday. Upper lung ventilation and pulmonary vascularity remains stable. No pneumothorax. Stable cardiac size and mediastinal contours. IMPRESSION: 1.  Stable lines and tubes. 2. Ventilation appears stable since yesterday, bilateral lower lobe consolidation. Electronically Signed   By: Odessa Fleming M.D.   On: 01/27/2019 08:27   DG CHEST PORT 1 VIEW  Result Date: 01/26/2019 CLINICAL DATA:  Central line placement, intubation, ECMO EXAM: PORTABLE CHEST 1 VIEW COMPARISON:  Same day chest radiograph, 11/26/2018 FINDINGS: Interval placement of a left neck vascular catheter and sheath, tip projecting over the left brachiocephalic vein. Unchanged right neck vascular catheter. Inferior approach venous ECMO cannula over the right atrium. Endotracheal tube in unchanged position. Cardiomegaly. Bilateral pleural effusions and associated atelectasis or consolidation. No new airspace opacity. IMPRESSION: 1. Interval placement of a left neck vascular catheter and sheath, tip projecting over the left brachiocephalic vein. 2. Otherwise unchanged AP portable examination with extensive support apparatus including endotracheal tube and ECMO. Electronically Signed   By: Lauralyn Primes M.D.   On: 01/26/2019 12:35   DG Chest Port 1 View  Result Date: 01/26/2019 CLINICAL DATA:  Endotracheal tube present. On ECMO. EXAM: PORTABLE CHEST 1 VIEW COMPARISON:  Radiograph yesterday. FINDINGS: Endotracheal tube tip 3.8 cm from the carina. Bilateral internal jugular sheaths in place. ECMO catheter in the right axilla and projecting over the atrial SVC junction. Lower lung volumes from prior exam. Unchanged heart size and mediastinal contours. Developing hazy bibasilar opacities, left greater than  right. No pneumothorax. IMPRESSION: 1. Endotracheal tube 3.8 cm from the carina. 2. Bilateral internal jugular sheaths.  ECMO catheters in place. 3. Lower lung volumes since yesterday with developing hazy bibasilar opacities, left greater than right, likely pleural effusions and adjacent atelectasis. Electronically Signed   By: Narda Rutherford M.D.   On: 01/26/2019 06:04   DG Chest Port 1 View  Result Date: 01/25/2019 CLINICAL DATA:  Endotracheal tube present. Patient on neck mass. EXAM: PORTABLE CHEST 1 VIEW COMPARISON:  Radiograph yesterday. FINDINGS: Endotracheal tube tip 2 cm from the carina.  Bilateral pulmonary thrombolysis infusion catheters femoral right internal jugular approach unchanged in position from prior exam. Left internal jugular cannula in the region of the upper mediastinum, unchanged. Right upper extremity ECMO catheter in the region of the axilla. X catheter from an inferior approach in the atrial SVC junction. Unchanged heart size and mediastinal contours. Slight worsening retrocardiac opacity. No pneumothorax. No evidence of pleural effusion. IMPRESSION: 1. Support apparatus as described. 2. Slight worsening hazy opacity at the left lung base. Electronically Signed   By: Narda Rutherford M.D.   On: 01/25/2019 06:26   DG CHEST PORT 1 VIEW  Result Date: 01/31/2019 CLINICAL DATA:  ECMO EXAM: PORTABLE CHEST 1 VIEW COMPARISON:  January 24, 2019 FINDINGS: The endotracheal tube terminates above the carina by approximately 3.6 cm. There are bilateral pulmonary thrombolysis infusion catheters. A left-sided central venous catheter is noted. The heart size is enlarged. Bilateral airspace opacities are again noted. A stent is partially visualized projecting over the patient's right axillary region. IMPRESSION: 1. Lines and tubes as above. The bilateral UniFuse catheters appear to be relatively stable when compared to postprocedural imaging earlier in the day. 2. Persistent patchy bilateral  airspace opacities as seen on prior CT. Electronically Signed   By: Katherine Mantle M.D.   On: 02/16/2019 20:49   DG Chest Portable 1 View  Result Date: 02/12/2019 CLINICAL DATA:  ECMO EXAM: PORTABLE CHEST 1 VIEW COMPARISON:  Portable exam 1702 hours compared to 01/18/2019 FINDINGS: Tip of endotracheal tube projects approximately 3.1 cm above carina. Large-bore catheter projects over SVC, RIGHT atrium and IVC. BILATERAL jugular lines. RIGHT upper extremity line versus graft. Stable heart size. Atelectasis versus infiltrate LEFT upper lobe. Remaining lungs clear. No pleural effusion or pneumothorax. IMPRESSION: LEFT upper lobe atelectasis versus consolidation. Lines and tubes as above. Electronically Signed   By: Ulyses Southward M.D.   On: 01/26/2019 17:19   DG C-Arm 1-60 Min-No Report  Result Date: 01/18/2019 Fluoroscopy was utilized by the requesting physician.  No radiographic interpretation.   ECHOCARDIOGRAM COMPLETE  Result Date: 02/02/2019   ECHOCARDIOGRAM REPORT   Patient Name:   Philip Wolfe Date of Exam: 01/31/2019 Medical Rec #:  [id number]     Height:       69.0 in Accession #:    [id number]    Weight:       323.0 lb Date of Birth:  [birth year]      BSA:          2.53 m Patient Age:    68 years      BP:           91/55 mmHg Patient Gender: M             HR:           99 bpm. Exam Location:  Inpatient Procedure: 2D Echo STAT ECHO Indications:    pulmonary embolus  History:        Patient has no prior history of Echocardiogram examinations.                 Risk Factors:Hypertension.  Sonographer:    Celene Skeen RDCS (AE) Referring Phys: [id number] Tobey Grim  Sonographer Comments: Technically difficult study due to poor echo windows, no parasternal window, suboptimal apical window, patient is morbidly obese and echo performed with patient supine and on artificial respirator. Image acquisition challenging due to patient body habitus and Image acquisition challenging due to respiratory motion.  IMPRESSIONS  1. Global right ventricle  has moderately reduced systolic function.The right ventricular size is mildly enlarged. No increase in right ventricular wall thickness. D-shaped interventricular septum suggesteive of RV pressure/volume overload. Difficult images but the free wall appears hypokinetic with preservation of the RV apical function (McConnell's sign), suggestive of hemodynamically significant PE.  2. Left ventricular ejection fraction, by visual estimation, is 45 to 50%. The left ventricle has mildly decreased function. There is mildly increased left ventricular hypertrophy. Extremely difficult images of the LV, Definity was not used. The above EF is a gross estimate. Cannot comment on motion of individual walls.  3. Left ventricular diastolic parameters are indeterminate.  4. Mild mitral annular calcification.  5. The mitral valve is normal in structure. Mild mitral valve regurgitation. No evidence of mitral stenosis.  6. The pulmonic valve was not assessed. Pulmonic valve regurgitation is not visualized.  7. The tricuspid valve is not well visualized. Tricuspid valve regurgitation is not demonstrated.  8. The aortic valve was not well visualized. Aortic valve regurgitation is not visualized.  9. The aortic root was not well visualized. 10. The interatrial septum was not well visualized. 11. Right atrial size was not well visualized. 12. Left atrial size was not well visualized. 13. Technically extremely difficult study. FINDINGS  Left Ventricle: Left ventricular ejection fraction, by visual estimation, is 45 to 50%. The left ventricle has mildly decreased function. The left ventricle is not well visualized. The left ventricular internal cavity size was the left ventricle is normal in size. There is mildly increased left ventricular hypertrophy. Left ventricular diastolic parameters are indeterminate. Right Ventricle: The right ventricular size is mildly enlarged. No increase in right ventricular  wall thickness. Global RV systolic function is has moderately reduced systolic function. Left Atrium: Left atrial size was not well visualized. Right Atrium: Right atrial size was not well visualized Pericardium: There is no evidence of pericardial effusion. Mitral Valve: The mitral valve is normal in structure. Mild mitral annular calcification. No evidence of mitral valve stenosis by observation. Mild mitral valve regurgitation. Tricuspid Valve: The tricuspid valve is not well visualized. Tricuspid valve regurgitation is not demonstrated. Aortic Valve: The aortic valve was not well visualized. Aortic valve regurgitation is not visualized. Pulmonic Valve: The pulmonic valve was not assessed. Pulmonic valve regurgitation is not visualized. Aorta: The aortic root was not well visualized. Venous: The inferior vena cava was not well visualized. IAS/Shunts: The interatrial septum was not well visualized.  Marca Ancona MD Electronically signed by Marca Ancona MD Signature Date/Time: 01/17/2019/10:13:07 AM    Final    VAS [place] LOWER EXTREMITY VENOUS (DVT)  Result Date: 01/27/2019  Lower Venous Study Indications: Pulmonary embolism, and On an ECMO machine.  Comparison Study: No priors. Performing Technologist: Marilynne Halsted RDMS, RVT  Examination Guidelines: A complete evaluation includes B-mode imaging, spectral Doppler, color Doppler, and power Doppler as needed of all accessible portions of each vessel. Bilateral testing is considered an integral part of a complete examination. Limited examinations for reoccurring indications may be performed as noted.  +---------+---------------+---------+-----------+----------+--------------+ RIGHT    CompressibilityPhasicitySpontaneityPropertiesThrombus Aging +---------+---------------+---------+-----------+----------+--------------+ CFV      Full           Yes      Yes                                  +---------+---------------+---------+-----------+----------+--------------+ SFJ      Full                                                        +---------+---------------+---------+-----------+----------+--------------+  FV Prox  Full                                                        +---------+---------------+---------+-----------+----------+--------------+ FV Mid   Full                                                        +---------+---------------+---------+-----------+----------+--------------+ FV DistalFull                                                        +---------+---------------+---------+-----------+----------+--------------+ PFV      Full                                                        +---------+---------------+---------+-----------+----------+--------------+ POP      Full           Yes      Yes                                 +---------+---------------+---------+-----------+----------+--------------+ PTV      Full                                                        +---------+---------------+---------+-----------+----------+--------------+ PERO     Full                                                        +---------+---------------+---------+-----------+----------+--------------+   +---------+---------------+---------+-----------+----------+-----------------+ LEFT     CompressibilityPhasicitySpontaneityPropertiesThrombus Aging    +---------+---------------+---------+-----------+----------+-----------------+ CFV      Full           Yes      Yes                                    +---------+---------------+---------+-----------+----------+-----------------+ SFJ      Full                                                           +---------+---------------+---------+-----------+----------+-----------------+ FV Prox  Full                                                            +---------+---------------+---------+-----------+----------+-----------------+  FV Mid   Full                                                           +---------+---------------+---------+-----------+----------+-----------------+ FV DistalFull                                                           +---------+---------------+---------+-----------+----------+-----------------+ PFV      Full                                                           +---------+---------------+---------+-----------+----------+-----------------+ POP      None           No       No                   Age Indeterminate +---------+---------------+---------+-----------+----------+-----------------+ PTV      Partial                                      Age Indeterminate +---------+---------------+---------+-----------+----------+-----------------+ PERO     Partial                                      Age Indeterminate +---------+---------------+---------+-----------+----------+-----------------+     Summary: Right: There is no evidence of deep vein thrombosis in the lower extremity. Left: Findings consistent with age indeterminate deep vein thrombosis involving the left popliteal vein, left posterior tibial veins, and left peroneal veins.  *See table(s) above for measurements and observations.    Preliminary    ECHOCARDIOGRAM LIMITED  Result Date: 01/27/2019   ECHOCARDIOGRAM LIMITED REPORT   Patient Name:   Philip Wolfe Date of Exam: 01/27/2019 Medical Rec #:  [id number]     Height:       69.0 in Accession #:    [id number]    Weight:       331.1 lb Date of Birth:  August 05, 1950      BSA:          2.56 m Patient Age:    68 years      BP:           87/64 mmHg Patient Gender: M             HR:           80 bpm. Exam Location:  Inpatient  Procedure: 2D Echo, Color Doppler and Intracardiac Opacification Agent Indications:    I50.31 Acute diastolic (congestive) heart failure  History:        Patient has  prior history of Echocardiogram examinations, most                 recent 01/26/2019. Active ECMO at time of study.  Sonographer:    Irving Burton Senior RDCS Referring Phys: [id number] [facility]  Sonographer Comments: Technically difficult study due to poor echo windows and patient is morbidly obese. Contrast approved by Dr. Shirlee Latch IMPRESSIONS  1. Left ventricular ejection fraction, by visual estimation, is 50 to 55%. The left ventricle has normal function. There is severely increased left ventricular hypertrophy.  2. Left ventricular diastolic function could not be evaluated.  3. Global right ventricle was not well visualized.The right ventricular size is not well visualized. Right vetricular wall thickness was not assessed.  4. Left atrial size was not well visualized.  5. Right atrial size was not well visualized.  6. The pericardium was not well visualized.  7. The mitral valve was not well visualized. not assessed mitral valve regurgitation.  8. The tricuspid valve is not well visualized. Tricuspid valve regurgitation not assessed.  9. Aortic valve regurgitation not assessed. 10. The aortic valve was not well visualized. Aortic valve regurgitation not assessed. 11. The pulmonic valve was not well visualized. Pulmonic valve regurgitation not assessed. 12. The aortic root was not well visualized. 13. Extremely technically difficult echo with limited and poor image quality. 14. The interatrial septum was not well visualized. FINDINGS  Left Ventricle: Left ventricular ejection fraction, by visual estimation, is 50 to 55%. The left ventricle has normal function. The left ventricle is not well visualized. There is severely increased left ventricular hypertrophy. The left ventricular diastology could not be evaluated due to nondiagnostic images. Left ventricular diastolic function could not be evaluated. Right Ventricle: The right ventricular size is not well visualized. Right vetricular wall thickness was not assessed.  Global RV systolic function is was not well visualized. Left Atrium: Left atrial size was not well visualized. Right Atrium: Right atrial size was not well visualized Pericardium: The pericardium was not well visualized. Mitral Valve: The mitral valve was not well visualized. Not assessed mitral valve regurgitation. Tricuspid Valve: The tricuspid valve is not well visualized. Tricuspid valve regurgitation not assessed. Aortic Valve: The aortic valve was not well visualized. Aortic valve regurgitation not assessed. Pulmonic Valve: The pulmonic valve was not well visualized. Pulmonic valve regurgitation not assessed. Aorta: The aortic root was not well visualized. Shunts: The interatrial septum was not well visualized. Additional Comments: Extremely technically difficult echo with limited and poor image quality.  Kristeen Miss MD Electronically signed by Kristeen Miss MD Signature Date/Time: 01/27/2019/11:17:10 AM    Final    ECHOCARDIOGRAM LIMITED  Result Date: 01/26/2019   ECHOCARDIOGRAM LIMITED REPORT   Patient Name:   TYRELLE RACZKA Date of Exam: 01/26/2019 Medical Rec #:  [id number]     Height:       69.0 in Accession #:    [id number]    Weight:       335.5 lb Date of Birth:  11/28/1950      BSA:          2.57 m Patient Age:    68 years      BP:           116/72 mmHg Patient Gender: M             HR:           89 bpm. Exam Location:  Inpatient  Procedure: Limited Echo Indications:    Pulmonary embolism  History:        Patient has prior history of Echocardiogram examinations, most                 recent 01/25/2019. [facility] ECMO, Cardiogenic shock, pulmonary  embolism, resp. failure.  Sonographer:    Lavenia Atlas Referring Phys: [phone] DALTON S MCLEAN  Sonographer Comments: Technically difficult study due to poor echo windows, Technically challenging study due to limited acoustic windows and patient is morbidly obese. Image acquisition challenging due to patient body habitus. IMPRESSIONS  1.  Extremely limited; LV and RV function cannot be determined with this study; no pericardial effusion; no other useful information obtained; suggest TEE if clinically indicated. FINDINGS  Left Ventricle: Left ventricular ejection fraction, by visual estimation, is Unable to quantitate%. The left ventricle has unable to quantitate function. The left ventricle is not well visualized. There is no left ventricular hypertrophy. Right Ventricle: The right ventricular size is not well visualized. Right vetricular wall thickness was not assessed. Global RV systolic function is was not assessed. Left Atrium: Left atrial size was not assessed. Right Atrium: Right atrial size was not assessed Pericardium: There is no evidence of pericardial effusion. Mitral Valve: The mitral valve was not well visualized. Not assessed mitral valve regurgitation. Tricuspid Valve: The tricuspid valve is not well visualized. Tricuspid valve regurgitation Not assessed. Aortic Valve: The aortic valve was not well visualized. Aortic valve regurgitation not assessed. Pulmonic Valve: The pulmonic valve was not well visualized. Pulmonic valve regurgitation Not assessed. Aorta: The aortic root is normal in size and structure. Shunts: The interatrial septum was not assessed.  Olga Millers MD Electronically signed by Olga Millers MD Signature Date/Time: 01/26/2019/12:06:32 PM    Final    ECHOCARDIOGRAM LIMITED  Result Date: 01/25/2019   ECHOCARDIOGRAM LIMITED REPORT   Patient Name:   Philip Wolfe Date of Exam: 01/25/2019 Medical Rec #:  [id number]     Height:       69.0 in Accession #:    [id number]    Weight:       326.7 lb Date of Birth:  02-09-1951      BSA:          2.55 m Patient Age:    68 years      BP:           98/75 mmHg Patient Gender: M             HR:           88 bpm. Exam Location:  Inpatient  Procedure: Limited Echo Indications:    Pulmonary embolus 415.19  History:        Patient has prior history of Echocardiogram examinations, most                  recent 02/04/2019.  Sonographer:    Tonia Ghent RDCS Referring Phys: [id number] DALTON S MCLEAN IMPRESSIONS  1. Global right ventricle has severely reduced systolic function.The right ventricular size is severely enlarged. No increase in right ventricular wall thickness.  2. Left ventricular ejection fraction, by visual estimation, is 50 to 55%. The left ventricle has low normal function. There is no left ventricular hypertrophy.  3. TR signal is inadequate for assessing pulmonary artery systolic pressure.  4. Right ventricular volume and pressure overload.  5. The left ventricle demonstrates regional wall motion abnormalities.  6. Presence of pericardial fat pad.  7. Trivial pericardial effusion is present.  8. The tricuspid valve is grossly normal. Tricuspid valve regurgitation is trivial.  9. No significant change with 02/04/2019. FINDINGS  Left Ventricle: Left ventricular ejection fraction, by visual estimation, is 50 to 55%. The left ventricle has low normal function. The left ventricle demonstrates regional wall motion abnormalities.  There is no left ventricular hypertrophy. The interventricular septum is flattened in systole and diastole, consistent with right ventricular pressure and volume overload. Left ventricular diastolic function could not be evaluated. Right Ventricle: The right ventricular size is severely enlarged. No increase in right ventricular wall thickness. Global RV systolic function is has severely reduced systolic function. Left Atrium: Left atrial size was normal in size. Right Atrium: Right atrial size was normal in size Pericardium: Trivial pericardial effusion is present. Presence of pericardial fat pad. Mitral Valve: The mitral valve is grossly normal. NWV mitral valve regurgitation. Tricuspid Valve: The tricuspid valve is grossly normal. Tricuspid valve regurgitation is trivial. Aortic Valve: The aortic valve is tricuspid. Aortic valve regurgitation is not visualized. The  aortic valve is structurally normal, with no evidence of sclerosis or stenosis. Pulmonic Valve: The pulmonic valve was not well visualized. Pulmonic valve regurgitation NWV. Aorta: The aortic root is normal in size and structure. Venous: The inferior vena cava was not well visualized. Shunts: The interatrial septum was not well visualized.  Lennie Odor MD Electronically signed by Lennie Odor MD Signature Date/Time: 01/25/2019/3:59:24 PM    Final    IR INFUSION THROMBOL ARTERIAL INITIAL (MS)  Result Date: 01/27/2019 INDICATION: 68 year old with history of massive pulmonary emboli and cardiogenic shock. Patient has already undergone systemic tPA and previous 12 hour catheter directed pulmonary artery thrombolysis. Patient is currently on ECMO and continues to have a large clot burden in the pulmonary arteries.  EXAM: BILATERAL PULMONARY ARTERIOGRAMS; SELECTION OF BILATERAL PULMONARY ARTERIES  PLACEMENT OF BILATERAL PULMONARY ARTERY INFUSION CATHETERS  ULTRASOUND GUIDANCE FOR VASCULAR ACCESS  COMPARISON:  01/27/2019  MEDICATIONS: None.  ANESTHESIA/SEDATION: Intubated and on ECMO.  FLUOROSCOPY TIME:  Fluoroscopy Time: 23 minutes, 30 seconds, 1015 mGy  COMPLICATIONS: None  TECHNIQUE: Informed consent was obtained from the patient's wife. Maximal Sterile Barrier Technique was utilized including caps, mask, sterile gowns, sterile gloves, sterile drape, hand hygiene and skin antiseptic. A timeout was performed prior to the initiation of the procedure.  Right neck was prepped and draped in sterile fashion. Patient already had a 6 French right jugular sheath in place. Ultrasound confirmed a patent right internal jugular vein. 21 gauge needle was directed into the right internal jugular vein and micropuncture dilator set was placed. A second 6 French vascular sheath was placed. The other 6 [place] vascular sheath was exchanged due to poor function. An angled 6 French pigtail catheter was advanced through the  heart into the right pulmonary artery. Right pulmonary arteriogram was performed. A Rosen wire was placed. Pigtail catheter was placed through the other 6 French vascular sheath and advanced into the main pulmonary artery and left pulmonary artery. Catheter was exchanged for a Kumpe catheter with a Bentson wire. Kumpe catheter was advanced into left pulmonary arteries and selective left pulmonary arteriograms were performed. Catheter was advanced into a lower lobe branch and removed over a Rosen wire. A 90 cm, 10 cm infusion length UniFuse catheter was advanced over the wire and positioned in the left lower lobe branch. Attention was directed to placement of the right pulmonary artery catheter. Kumpe catheter was advanced into right pulmonary artery branches and selective angiography was performed. Eventually, catheter was advanced into the right upper trunk that was completely occluded on the arteriogram. A 90 cm x 15 cm infusion length UniFuse catheter was placed into the right upper trunk. Both sheaths were secured to the skin with suture.  FINDINGS: There continues to be a large amount of clot  in the distal right pulmonary artery extending into the upper and lower trunks. Compared to the previous examination, there is now some flow into the right lower lobe branches. There continues to be no flow into the right upper lobe branches. Right pulmonary infusion catheter was placed within the right upper trunk. Segmental clot identified in the left pulmonary arteries. Left PA infusion catheter was placed within a left lower segmental branch.  IMPRESSION: 1. Pulmonary arteriography demonstrates bilateral pulmonary emboli. Slightly improved flow to the right pulmonary arteries compared to the examination on 02/04/2019. There continues to be a large clot burden in the distal right pulmonary artery and no significant flow into the right upper lobe branches. 2. Successful placement of bilateral pulmonary artery infusion  catheters. Initiation of catheter directed thrombolysis. Plan for 1 mg of tPA through in each catheter for total of 2 mg/hour. Plan for 12 hour treatment.   Electronically Signed   By: Richarda Overlie M.D.   On: 01/26/2019 18:57  IR INFUSION THROMBOL ARTERIAL INITIAL (MS)  Result Date: 01/26/2019 INDICATION: 68 year old with history of massive pulmonary emboli and cardiogenic shock. Patient has already undergone systemic tPA and previous 12 hour catheter directed pulmonary artery thrombolysis. Patient is currently on ECMO and continues to have a large clot burden in the pulmonary arteries. EXAM: BILATERAL PULMONARY ARTERIOGRAMS; SELECTION OF BILATERAL PULMONARY ARTERIES PLACEMENT OF BILATERAL PULMONARY ARTERY INFUSION CATHETERS ULTRASOUND GUIDANCE FOR VASCULAR ACCESS COMPARISON:  02/06/2019 MEDICATIONS: None. ANESTHESIA/SEDATION: Intubated and on ECMO. FLUOROSCOPY TIME:  Fluoroscopy Time: 23 minutes, 30 seconds, 1015 mGy COMPLICATIONS: None TECHNIQUE: Informed consent was obtained from the patient's wife. Maximal Sterile Barrier Technique was utilized including caps, mask, sterile gowns, sterile gloves, sterile drape, hand hygiene and skin antiseptic. A timeout was performed prior to the initiation of the procedure. Right neck was prepped and draped in sterile fashion. Patient already had a 6 French right jugular sheath in place. Ultrasound confirmed a patent right internal jugular vein. 21 gauge needle was directed into the right internal jugular vein and micropuncture dilator set was placed. A second 6 French vascular sheath was placed. The other 6 [place] vascular sheath was exchanged due to poor function. An angled 6 French pigtail catheter was advanced through the heart into the right pulmonary artery. Right pulmonary arteriogram was performed. A Rosen wire was placed. Pigtail catheter was placed through the other 6 French vascular sheath and advanced into the main pulmonary artery and left pulmonary artery.  Catheter was exchanged for a Kumpe catheter with a Bentson wire. Kumpe catheter was advanced into left pulmonary arteries and selective left pulmonary arteriograms were performed. Catheter was advanced into a lower lobe branch and removed over a Rosen wire. A 90 cm, 10 cm infusion length UniFuse catheter was advanced over the wire and positioned in the left lower lobe branch. Attention was directed to placement of the right pulmonary artery catheter. Kumpe catheter was advanced into right pulmonary artery branches and selective angiography was performed. Eventually, catheter was advanced into the right upper trunk that was completely occluded on the arteriogram. A 90 cm x 15 cm infusion length UniFuse catheter was placed into the right upper trunk. Both sheaths were secured to the skin with suture. FINDINGS: There continues to be a large amount of clot in the distal right pulmonary artery extending into the upper and lower trunks. Compared to the previous examination, there is now some flow into the right lower lobe branches. There continues to be no flow into the right upper  lobe branches. Right pulmonary infusion catheter was placed within the right upper trunk. Segmental clot identified in the left pulmonary arteries. Left PA infusion catheter was placed within a left lower segmental branch. IMPRESSION: 1. Pulmonary arteriography demonstrates bilateral pulmonary emboli. Slightly improved flow to the right pulmonary arteries compared to the examination on 01/28/2019. There continues to be a large clot burden in the distal right pulmonary artery and no significant flow into the right upper lobe branches. 2. Successful placement of bilateral pulmonary artery infusion catheters. Initiation of catheter directed thrombolysis. Plan for 1 mg of tPA through in each catheter for total of 2 mg/hour. Plan for 12 hour treatment. Electronically Signed   By: Richarda Overlie M.D.   On: 01/26/2019 18:57   IR INFUSION THROMBOL  ARTERIAL INITIAL (MS)  Result Date: 02/02/2019 INDICATION: Acute bilateral occlusive pulmonary emboli EXAM: Ultrasound guidance for vascular access Bilateral pulmonary artery catheterizations, angiograms, and insertion of infusion catheters for PE thrombolysis COMPARISON:  11/23/2018 MEDICATIONS: Patient is currently on ECMO ANESTHESIA/SEDATION: The patient was continuously monitored during the procedure by the ECMO team FLUOROSCOPY TIME:  Fluoroscopy Time: 14 minutes 12 seconds (822 mGy). COMPLICATIONS: None immediate. TECHNIQUE: Informed written consent was obtained from the patient's family after a thorough discussion of the procedural risks, benefits and alternatives. All questions were addressed. Maximal Sterile Barrier Technique was utilized including caps, mask, sterile gowns, sterile gloves, sterile drape, hand hygiene and skin antiseptic. A timeout was performed prior to the initiation of the procedure. Under sterile conditions, the existing 8 French sheath in the right internal jugular vein was exchanged over 2 guidewires. Two adjacent 6 French sheaths were inserted. Initially, a 6 French angle pigtail catheter was advanced through 1 of the 6 French sheaths into the right heart. This was advanced through the right heart outflow into the pulmonary arteries and initially position of the right pulmonary artery. Pulmonary angiogram performed. Initial pulmonary angiogram: Occlusive filling defects noted in the proximal right pulmonary artery. Nonocclusive filling defects in the left pulmonary artery. Over Rosen guidewire, a 10 cm infusion catheter was advanced into the right pulmonary artery extending into the lower lobe branches. Contrast injection confirms position. Inner occlusion wire advanced. Position confirmed with fluoroscopy. In a similar fashion through the second right IJ adjacent 6 French sheath, the angled 6 French pigtail catheter was advanced and positioned in the right heart initially. This  was advanced through the right heart outflow into the main pulmonary artery. Catheter was exchanged for a Kumpe catheter. Kumpe catheter was directed to the left pulmonary artery. Over the [facility] guidewire, a second 10 cm infusion catheter was advanced into the left pulmonary artery. Position confirmed with contrast injection. Inner occlusion wire advanced. Catheter positions confirmed with fluoroscopy. Access site secured to the right neck with a sterile dressing. PE thrombo lysis protocol will be initiated through both catheters for the standard protocol infusion. FINDINGS: Bilateral pulmonary angiograms confirm occlusive central large clot burden in the right hilum. Nonocclusive thrombus in the central left pulmonary arteries. IMPRESSION: Successful bilateral pulmonary artery catheterizations, angiograms, and insertion of lysis catheters to begin PE thrombo lysis protocol. Electronically Signed   By: Judie Petit.  Shick M.D.   On: 01/30/2019 20:48   IR INFUSION THROMBOL ARTERIAL INITIAL (MS)  Result Date: 02/06/2019 INDICATION: Acute bilateral occlusive pulmonary emboli EXAM: Ultrasound guidance for vascular access Bilateral pulmonary artery catheterizations, angiograms, and insertion of infusion catheters for PE thrombolysis COMPARISON:  11/23/2018 MEDICATIONS: Patient is currently on ECMO ANESTHESIA/SEDATION: The patient was continuously  monitored during the procedure by the ECMO team FLUOROSCOPY TIME:  Fluoroscopy Time: 14 minutes 12 seconds (822 mGy). COMPLICATIONS: None immediate. TECHNIQUE: Informed written consent was obtained from the patient's family after a thorough discussion of the procedural risks, benefits and alternatives. All questions were addressed. Maximal Sterile Barrier Technique was utilized including caps, mask, sterile gowns, sterile gloves, sterile drape, hand hygiene and skin antiseptic. A timeout was performed prior to the initiation of the procedure. Under sterile conditions, the existing 8  French sheath in the right internal jugular vein was exchanged over 2 guidewires. Two adjacent 6 French sheaths were inserted. Initially, a 6 French angle pigtail catheter was advanced through 1 of the 6 French sheaths into the right heart. This was advanced through the right heart outflow into the pulmonary arteries and initially position of the right pulmonary artery. Pulmonary angiogram performed. Initial pulmonary angiogram: Occlusive filling defects noted in the proximal right pulmonary artery. Nonocclusive filling defects in the left pulmonary artery. Over Rosen guidewire, a 10 cm infusion catheter was advanced into the right pulmonary artery extending into the lower lobe branches. Contrast injection confirms position. Inner occlusion wire advanced. Position confirmed with fluoroscopy. In a similar fashion through the second right IJ adjacent 6 French sheath, the angled 6 French pigtail catheter was advanced and positioned in the right heart initially. This was advanced through the right heart outflow into the main pulmonary artery. Catheter was exchanged for a Kumpe catheter. Kumpe catheter was directed to the left pulmonary artery. Over the [facility] guidewire, a second 10 cm infusion catheter was advanced into the left pulmonary artery. Position confirmed with contrast injection. Inner occlusion wire advanced. Catheter positions confirmed with fluoroscopy. Access site secured to the right neck with a sterile dressing. PE thrombo lysis protocol will be initiated through both catheters for the standard protocol infusion. FINDINGS: Bilateral pulmonary angiograms confirm occlusive central large clot burden in the right hilum. Nonocclusive thrombus in the central left pulmonary arteries. IMPRESSION: Successful bilateral pulmonary artery catheterizations, angiograms, and insertion of lysis catheters to begin PE thrombo lysis protocol. Electronically Signed   By: Judie Petit.  Shick M.D.   On: 02/15/2019 20:48   IR THROMB F/U  EVAL ART/VEN FINAL DAY (MS)  Result Date: 01/27/2019 INDICATION: 68 year old male with a history pulmonary embolism, finishing his second day catheter thrombolysis EXAM: FOLLOW-UP PE LYSIS COMPARISON:  01/26/2019 MEDICATIONS: None ANESTHESIA/SEDATION: None FLUOROSCOPY TIME:  None COMPLICATIONS: None TECHNIQUE: Catheters were removed at the bedside in the ICU. No images were acquired. Pressures were not transduce to, status post repeat echo today. FINDINGS: Catheters removed in their entirety. IMPRESSION: Follow-up PE lysis with removal of the bilateral pulmonary artery lysis catheters at the bedside. Electronically Signed   By: Gilmer Mor D.O.   On: 01/27/2019 16:48   IR THROMB F/U EVAL ART/VEN FINAL DAY (MS)  Result Date: 01/25/2019 INDICATION: Massive pulmonary embolism requiring 2 doses of systemic tPA, ultimately placed on ECMO, followed by initiation of bilateral pulmonary arterial catheter directed thrombolysis. EXAM: IR THROMB F/U EVAL ART/VEN FINAL DAY COMPARISON:  Initiation of bilateral pulmonary arterial catheter directed thrombolysis-02/11/2019; chest CT-02/09/2019 MEDICATIONS: None ANESTHESIA/SEDATION: None CONTRAST:  None COMPLICATIONS: None immediate. PROCEDURE: Pressure measurements were NOT acquired at the time of initiation of bilateral pulmonary arterial catheter directed thrombolysis as the patient was on ECMO. As such, prolonged conversations were held with the ICU staff regarding continuing bilateral pulmonary arterial directed thrombolysis for a time period greater than the standard 12 hours versus terminating the procedure. Following  this prolonged detailed conversation, the decision was made to terminate the procedure. As pressure measurements were not obtained at the initiation of the procedure, postprocedural pressure make sense for also not obtained. All wires and catheters removed from the right jugular access site. One of the two vascular sheaths was left in place for  continued durable intravenous access per request from the ICU staff. IMPRESSION: Technically successful completion of 12 hour bilateral catheter directed thrombolysis for massive pulmonary embolism. Electronically Signed   By: Simonne Come M.D.   On: 01/25/2019 15:43    Labs:  CBC: Recent Labs    01/27/19 1545 01/27/19 2029 01/28/19 0051 01/28/19 0416 01/28/19 0428  WBC 40.0* 36.2* 32.2*  --  32.1*  HGB 7.1* 7.0* 7.7* 6.5* 7.0*  HCT 19.8* 20.2* 22.0* 19.0* 20.6*  PLT 50* 72* 67*  --  65*    COAGS: Recent Labs    01/25/19 0611 01/26/19 0200 01/26/19 1740 01/27/19 0446 01/27/19 1545 01/28/19 0428  INR 2.4* 1.5*  --  1.3*  --  1.4*  APTT 42* 104* 111* 130* 116* 126*    BMP: Recent Labs    01/27/19 0222 01/27/19 0446 01/27/19 1545 01/27/19 1826 01/28/19 0416 01/28/19 0428  NA 140 140 141 139 140 142  K 3.8 3.8 4.0 4.0 3.7 3.8  CL 107 102 105  --   --  107  CO2 26 29 27   --   --  26  GLUCOSE 186* 202* 164*  --   --  171*  BUN 38* 39* 45*  --   --  50*  CALCIUM 6.0* 6.2* 6.4*  --   --  6.2*  CREATININE 1.27* 1.27* 1.44*  --   --  1.70*  GFRNONAA 58* 58* 50*  --   --  41*  GFRAA >60 >60 58*  --   --  47*    LIVER FUNCTION TESTS: Recent Labs    01/26/19 1740 01/27/19 0446 01/27/19 1545 01/28/19 0428  BILITOT 0.7 0.9 1.0 0.9  AST 59* 52* 42* 42*  ALT 32 29 26 22   ALKPHOS 42 48 46 43  PROT 3.7* 3.7* 3.6* 3.3*  ALBUMIN 1.9* 1.8* 1.8* 1.6*    Assessment and Plan:  Massive PE/Intubated PE Lysis x 2--- 12/8 and 12/10 Dr Shirlee Latch note: Pulmonary embolus: Massive bilateral PE.  He got 150 mg IV total tPA initially, then catheter directed tPA to both PAs on 12/9 and again on 12/10.  Discussed surgical thrombectomy but with improvement in RV appearance by echo, holding off for now  Will follow  Electronically Signed: Robet Leu, PA-C 01/28/2019, 8:09 AM   I spent a total of 15 Minutes at the the patient's bedside AND on the patient's hospital floor or  unit, greater than 50% of which was counseling/coordinating care for Massive B PE- post lysis x 2

## 2019-01-28 NOTE — Progress Notes (Signed)
ANTICOAGULATION CONSULT NOTE   Pharmacy Consult for Heparin  Indication: pulmonary embolus + ECMO  No Known Allergies  Patient Measurements: Height: 5\' 9"  (175.3 cm) Weight: (!) 338 lb 3 oz (153.4 kg) IBW/kg (Calculated) : 70.7 Heparin Dosing Weight: 103 kg  Vital Signs: Temp: 98.6 F (37 C) (12/12 2100) Temp Source: Bladder (12/12 1635) BP: 115/75 (12/12 1605) Pulse Rate: 89 (12/12 2015)  Labs: Recent Labs    01/26/19 0200 01/26/19 0356 01/27/19 0446 01/27/19 0621 01/27/19 1253 01/27/19 1545 01/27/19 1630 01/27/19 1826 01/28/19 0428 01/28/19 0550 01/28/19 1059 01/28/19 1703 01/28/19 1707 01/28/19 1818 01/28/19 2004  HGB 8.0*  --  8.0*  --    < > 7.1*  --   --  7.0*  --  7.1* 8.5* 8.5* 8.1*  --   HCT 22.3*  --  23.4*  --    < > 19.8*  --   --  20.6*  --  21.0* 24.8* 25.0* 23.5*  --   PLT 70*   < > 54*   < >  --  50*  --    < > 65*  --  59* 57*  --  57*  --   APTT 104*   < > 130*  --   --  116*  --   --  126*  --   --   --   --   --   --   LABPROT 18.0*  --  16.5*  --   --   --   --   --  16.6*  --   --   --   --   --   --   INR 1.5*  --  1.3*  --   --   --   --   --  1.4*  --   --   --   --   --   --   HEPARINUNFRC 0.37   < > 0.42   < >  --   --  0.42  --   --  0.36  --   --   --   --  0.27*  CREATININE 1.37*   < > 1.27*  --   --  1.44*  --   --  1.70*  --   --  1.55*  --   --   --    < > = values in this interval not displayed.    Estimated Creatinine Clearance: 67.9 mL/min (A) (by C-G formula based on SCr of 1.55 mg/dL (H)).  Assessment: 72 yoM admitted with massive PE and RV failure. Pt received 150mg  total of systemic tPA with continued shock so ECMO started 12/8. EKOS started along with IV heparin, then repeated 12/10 with significant clot burden. RV function improving on ECHO 12/11 - holding off further tPA/thrombectomy  Heparin level remains therapeutic at 0.27 units/mL.  Bleeding finally controlled per ENT.  Goal of Therapy:  Heparin level 0.25-0.5  units/mL per MD (while on ECMO) Monitor platelets by anticoagulation protocol: Yes    Plan:  -Continue heparin 1400 units/hr -Check heparin level (0500/1700) -Monitor for bleeding  Philana Younis D. Mina Marble, PharmD, BCPS, [place] 01/28/2019, 9:06 PM

## 2019-01-28 NOTE — Progress Notes (Signed)
Subjective: Remains intubated, on ECMO. Not responsive. No more bleeding  Objective: Vital signs in last 24 hours: Temp:  [97.5 F (36.4 C)-98.1 F (36.7 C)] 97.7 F (36.5 C) (12/12 1900) Pulse Rate:  [78-91] 82 (12/12 1635) Resp:  [0-49] 5 (12/12 1900) BP: (98-115)/(64-75) 115/75 (12/12 1605) SpO2:  [100 %] 100 % (12/12 1635) Arterial Line BP: (87-124)/(61-83) 107/69 (12/12 1900) FiO2 (%):  [30 %-40 %] 30 % (12/12 1635) Weight:  [153.4 kg] 153.4 kg (12/12 0600)  Physical exam: General appearance:Morbidly obese andintubated. Sedated. Not responsive. Head:Normocephalic, without obvious abnormality, atraumatic Ears: Examination of the ears shows normal auricles and external auditory canals bilaterally.  Nose: Nasal packing in place bilaterally. No more bleeding. Face: Facial examination shows no asymmetry.  Mouth:ET tube in place. No more pharyngeal bleeding. Neck:Palpation of the neck reveals no lymphadenopathy or mass. The trachea is midline. The thyroid is not significantly enlarged.  Neuro:Sedated. Not responsive.  Recent Labs    01/28/19 1703 01/28/19 1707 01/28/19 1818  WBC 30.2*  --  29.1*  HGB 8.5* 8.5* 8.1*  HCT 24.8* 25.0* 23.5*  PLT 57*  --  57*   Recent Labs    01/28/19 0428 01/28/19 1703 01/28/19 1707  NA 142 140 141  K 3.8 3.8 3.8  CL 107 106  --   CO2 26 27  --   GLUCOSE 171* 161*  --   BUN 50* 53*  --   CREATININE 1.70* 1.55*  --   CALCIUM 6.2* 6.4*  --     Medications:  I have reviewed the patient's current medications. Scheduled: . sodium chloride   Intravenous Once  . acetaminophen  1,000 mg Oral Q6H   Or  . acetaminophen (TYLENOL) oral liquid 160 mg/5 mL  1,000 mg Per Tube Q6H  . bisacodyl  10 mg Oral Daily   Or  . bisacodyl  10 mg Rectal Daily  . chlorhexidine gluconate (MEDLINE KIT)  15 mL Mouth Rinse BID  . Chlorhexidine Gluconate Cloth  6 each Topical Daily  . docusate sodium  200 mg Oral Daily  . feeding supplement  (PRO-STAT SUGAR FREE 64)  30 mL Per Tube TID  . mouth rinse  15 mL Mouth Rinse 10 times per day  . metoCLOPramide (REGLAN) injection  10 mg Intravenous Q6H  . pantoprazole (PROTONIX) IV  40 mg Intravenous Q12H  . sodium chloride flush  10-40 mL Intracatheter Q12H  . sodium chloride flush  3 mL Intravenous Q12H   Continuous: . sodium chloride Stopped (01/28/19 1841)  . albumin human 12.5 g (01/28/19 1404)  . calcium gluconate    . ceFEPime (MAXIPIME) IV Stopped (01/28/19 1745)  . dexmedetomidine (PRECEDEX) IV infusion Stopped (01/26/19 1143)  . epinephrine Stopped (01/28/19 1610)  . feeding supplement (VITAL 1.5 CAL) Stopped (01/28/19 0830)  . fentaNYL infusion INTRAVENOUS 375 mcg/hr (01/28/19 1900)  . furosemide (LASIX) infusion 8 mg/hr (01/28/19 1900)  . heparin 1,400 Units/hr (01/28/19 1900)  . insulin 3.2 mL/hr at 01/28/19 1900  . lactated ringers 10 mL/hr at 01/25/19 1031  . lactated ringers Stopped (02/07/2019 2159)  . midazolam (VERSED) 250 mg in sodium chloride 0.9% 250 mL infusion 2 mL/hr at 01/28/19 1900  . norepinephrine (LEVOPHED) Adult infusion 24 mcg/min (01/28/19 1900)  . vancomycin 200 mL/hr at 01/28/19 1900  . vasopressin (PITRESSIN) infusion - *FOR SHOCK* 0.01 Units/min (01/28/19 1929)    Assessment/Plan: Recent severe bilateral epistaxiss/p TPA treatment for massive pulmonary emboli.Bleeding is finally under control. - Will leave packing  in place until next week. - Continue abx treatment.   LOS: 5 days   Dionysios Massman W Ravin Bendall 01/28/2019, 7:31 PM

## 2019-01-28 NOTE — Progress Notes (Addendum)
ANTICOAGULATION CONSULT NOTE   Pharmacy Consult for Heparin  Indication: pulmonary embolus + ECMO  No Known Allergies  Patient Measurements: Height: 5' 9.02" (175.3 cm) Weight: (!) 338 lb 3 oz (153.4 kg) IBW/kg (Calculated) : 70.74 Heparin Dosing Weight: 103 kg  Vital Signs: Temp: 97.7 F (36.5 C) (12/12 0600) Temp Source: Bladder (12/12 0515) BP: 107/72 (12/12 0500) Pulse Rate: 82 (12/12 0545)  Labs: Recent Labs    01/26/19 0200 01/26/19 0356 01/26/19 1740 01/26/19 1758 01/27/19 0446 01/27/19 0621 01/27/19 1253 01/27/19 1545 01/27/19 1630 01/27/19 2029 01/28/19 0051 01/28/19 0416 01/28/19 0428 01/28/19 0550  HGB 8.0*  --  7.7*  --  8.0*  --   --  7.1*  --  7.0* 7.7* 6.5* 7.0*  --   HCT 22.3*  --  22.4*  --  23.4*  --   --  19.8*  --  20.2* 22.0* 19.0* 20.6*  --   PLT 70*   < > 57*   < > 54*   < >  --  50*  --  72* 67*  --  65*  --   APTT 104*  --  111*  --  130*  --   --  116*  --   --   --   --   --   --   LABPROT 18.0*  --   --   --  16.5*  --   --   --   --   --   --   --   --   --   INR 1.5*  --   --   --  1.3*  --   --   --   --   --   --   --   --   --   HEPARINUNFRC 0.37  --  0.52   < > 0.42  --  0.45  --  0.42  --   --   --   --  0.36  CREATININE 1.37*  --  1.29*   < > 1.27*  --   --  1.44*  --   --   --   --  1.70*  --    < > = values in this interval not displayed.    Estimated Creatinine Clearance: 61.9 mL/min (A) (by C-G formula based on SCr of 1.7 mg/dL (H)).  Assessment: 67 yoM admitted with massive PE and RV failure. Pt received 150mg  total of systemic tPA with continued shock so ECMO started 12/8. EKOS started along with IV heparin, then repeated 12/10 with significant clot burden. RV function improving on ECHO 12/11 - holding off further tPA/thrombectomy  Heparin level remains therapeutic at 0.36. INR normalized. Hgb 7 s/p 1 unit PRBC yesterday, pltc stable 65.  Goal of Therapy:  Heparin level 0.3-0.5 units/ml (while on ECMO) Monitor  platelets by anticoagulation protocol: Yes    Plan:  -Continue heparin 1500 units/hr -Check heparin level (0500/1700) -Monitor for bleeding  Addendum: -Discussed possible bleed w/ ECMO team. Heparin level has been therapeutic but aPTT's have remained higher than expected. This is likely due to several rounds of tPA given for PE -Will continue dosing heparin off heparin levels for now.  -Reduce heparin to 1400 units/hr with possible bleed.  -HL goal from 0.25-0.5 is acceptable -Will check heparin level at 8pm due to adjustment in rate this afternoon  Vertis Kelch, PharmD, [facility] PGY2 Cardiology Pharmacy Resident Phone [phone] 01/28/2019       7:11 AM  Please check [email or web address] for unit-specific pharmacist phone numbers

## 2019-01-28 NOTE — Progress Notes (Signed)
  Echocardiogram 2D Echocardiogram has been performed.  [name] [name] 01/28/2019, 4:06 PM

## 2019-01-28 NOTE — Progress Notes (Signed)
Notified McLean MD of patient's lactic acid of 2 as well as current CBC results. Orders for a total of 2 units of pRBCs.

## 2019-01-28 NOTE — Progress Notes (Signed)
Patient's wife Webb Silversmith requesting to be called every morning by a physician for updates. Will pass along in report.

## 2019-01-29 ENCOUNTER — Encounter (HOSPITAL_COMMUNITY): Payer: Self-pay | Admitting: Pulmonary Disease

## 2019-01-29 ENCOUNTER — Inpatient Hospital Stay (HOSPITAL_COMMUNITY): Payer: BC Managed Care – PPO

## 2019-01-29 LAB — POCT I-STAT 7, (LYTES, BLD GAS, ICA,H+H)
Acid-Base Excess: 7 mmol/L — ABNORMAL HIGH (ref 0.0–2.0)
Acid-Base Excess: 7 mmol/L — ABNORMAL HIGH (ref 0.0–2.0)
Acid-Base Excess: 6 mmol/L — ABNORMAL HIGH (ref 0.0–2.0)
Acid-Base Excess: 6 mmol/L — ABNORMAL HIGH (ref 0.0–2.0)
Bicarbonate: 30.9 mmol/L — ABNORMAL HIGH (ref 20.0–28.0)
Bicarbonate: 28.7 mmol/L — ABNORMAL HIGH (ref 20.0–28.0)
Bicarbonate: 30.3 mmol/L — ABNORMAL HIGH (ref 20.0–28.0)
Bicarbonate: 30.2 mmol/L — ABNORMAL HIGH (ref 20.0–28.0)
Calcium, Ion: 0.99 mmol/L — ABNORMAL LOW (ref 1.15–1.40)
Calcium, Ion: 1.03 mmol/L — ABNORMAL LOW (ref 1.15–1.40)
Calcium, Ion: 1.03 mmol/L — ABNORMAL LOW (ref 1.15–1.40)
Calcium, Ion: 1.01 mmol/L — ABNORMAL LOW (ref 1.15–1.40)
HCT: 22 % — ABNORMAL LOW (ref 39.0–52.0)
HCT: 23 % — ABNORMAL LOW (ref 39.0–52.0)
HCT: 23 % — ABNORMAL LOW (ref 39.0–52.0)
HCT: 23 % — ABNORMAL LOW (ref 39.0–52.0)
Hemoglobin: 7.5 g/dL — ABNORMAL LOW (ref 13.0–17.0)
Hemoglobin: 7.8 g/dL — ABNORMAL LOW (ref 13.0–17.0)
Hemoglobin: 7.8 g/dL — ABNORMAL LOW (ref 13.0–17.0)
Hemoglobin: 7.8 g/dL — ABNORMAL LOW (ref 13.0–17.0)
O2 Saturation: 100 %
O2 Saturation: 100 %
O2 Saturation: 100 %
O2 Saturation: 100 %
Patient temperature: 36.5
Patient temperature: 36.5
Patient temperature: 36.7
Patient temperature: 36.6
Potassium: 3.6 mmol/L (ref 3.5–5.1)
Potassium: 4 mmol/L (ref 3.5–5.1)
Potassium: 4 mmol/L (ref 3.5–5.1)
Potassium: 4.4 mmol/L (ref 3.5–5.1)
Sodium: 142 mmol/L (ref 135–145)
Sodium: 141 mmol/L (ref 135–145)
Sodium: 141 mmol/L (ref 135–145)
Sodium: 143 mmol/L (ref 135–145)
TCO2: 32 mmol/L (ref 22–32)
TCO2: 30 mmol/L (ref 22–32)
TCO2: 32 mmol/L (ref 22–32)
TCO2: 31 mmol/L (ref 22–32)
pCO2 arterial: 39.4 mmHg (ref 32.0–48.0)
pCO2 arterial: 28.9 mmHg — ABNORMAL LOW (ref 32.0–48.0)
pCO2 arterial: 40.9 mmHg (ref 32.0–48.0)
pCO2 arterial: 42 mmHg (ref 32.0–48.0)
pH, Arterial: 7.501 — ABNORMAL HIGH (ref 7.350–7.450)
pH, Arterial: 7.604 (ref 7.350–7.450)
pH, Arterial: 7.477 — ABNORMAL HIGH (ref 7.350–7.450)
pH, Arterial: 7.463 — ABNORMAL HIGH (ref 7.350–7.450)
pO2, Arterial: 221 mmHg — ABNORMAL HIGH (ref 83.0–108.0)
pO2, Arterial: 205 mmHg — ABNORMAL HIGH (ref 83.0–108.0)
pO2, Arterial: 251 mmHg — ABNORMAL HIGH (ref 83.0–108.0)
pO2, Arterial: 248 mmHg — ABNORMAL HIGH (ref 83.0–108.0)

## 2019-01-29 LAB — BASIC METABOLIC PANEL
Anion gap: 9 (ref 5–15)
BUN: 54 mg/dL — ABNORMAL HIGH (ref 8–23)
CO2: 27 mmol/L (ref 22–32)
Calcium: 6.9 mg/dL — ABNORMAL LOW (ref 8.9–10.3)
Chloride: 108 mmol/L (ref 98–111)
Creatinine, Ser: 1.75 mg/dL — ABNORMAL HIGH (ref 0.61–1.24)
GFR calc Af Amer: 46 mL/min — ABNORMAL LOW (ref >60)
GFR calc non Af Amer: 39 mL/min — ABNORMAL LOW (ref >60)
Glucose, Bld: 189 mg/dL — ABNORMAL HIGH (ref 70–99)
Potassium: 4.3 mmol/L (ref 3.5–5.1)
Sodium: 144 mmol/L (ref 135–145)

## 2019-01-29 LAB — CBC
HCT: 23.4 % — ABNORMAL LOW (ref 39.0–52.0)
HCT: 21.8 % — ABNORMAL LOW (ref 39.0–52.0)
HCT: 24.3 % — ABNORMAL LOW (ref 39.0–52.0)
HCT: 23.1 % — ABNORMAL LOW (ref 39.0–52.0)
Hemoglobin: 8.1 g/dL — ABNORMAL LOW (ref 13.0–17.0)
Hemoglobin: 7.6 g/dL — ABNORMAL LOW (ref 13.0–17.0)
Hemoglobin: 8.4 g/dL — ABNORMAL LOW (ref 13.0–17.0)
Hemoglobin: 7.9 g/dL — ABNORMAL LOW (ref 13.0–17.0)
MCH: 31.5 pg (ref 26.0–34.0)
MCH: 31.4 pg (ref 26.0–34.0)
MCH: 31.1 pg (ref 26.0–34.0)
MCH: 31.5 pg (ref 26.0–34.0)
MCHC: 34.6 g/dL (ref 30.0–36.0)
MCHC: 34.9 g/dL (ref 30.0–36.0)
MCHC: 34.6 g/dL (ref 30.0–36.0)
MCHC: 34.2 g/dL (ref 30.0–36.0)
MCV: 91.1 fL (ref 80.0–100.0)
MCV: 90.1 fL (ref 80.0–100.0)
MCV: 90 fL (ref 80.0–100.0)
MCV: 92 fL (ref 80.0–100.0)
Platelets: 60 10*3/uL — ABNORMAL LOW (ref 150–400)
Platelets: 66 10*3/uL — ABNORMAL LOW (ref 150–400)
Platelets: 63 10*3/uL — ABNORMAL LOW (ref 150–400)
Platelets: 71 10*3/uL — ABNORMAL LOW (ref 150–400)
RBC: 2.57 MIL/uL — ABNORMAL LOW (ref 4.22–5.81)
RBC: 2.42 MIL/uL — ABNORMAL LOW (ref 4.22–5.81)
RBC: 2.7 MIL/uL — ABNORMAL LOW (ref 4.22–5.81)
RBC: 2.51 MIL/uL — ABNORMAL LOW (ref 4.22–5.81)
RDW: 13.8 % (ref 11.5–15.5)
RDW: 13.7 % (ref 11.5–15.5)
RDW: 13.9 % (ref 11.5–15.5)
RDW: 14.4 % (ref 11.5–15.5)
WBC: 28.9 10*3/uL — ABNORMAL HIGH (ref 4.0–10.5)
WBC: 27 10*3/uL — ABNORMAL HIGH (ref 4.0–10.5)
WBC: 26.3 10*3/uL — ABNORMAL HIGH (ref 4.0–10.5)
WBC: 27.4 10*3/uL — ABNORMAL HIGH (ref 4.0–10.5)
nRBC: 9.5 % — ABNORMAL HIGH (ref 0.0–0.2)
nRBC: 10.9 % — ABNORMAL HIGH (ref 0.0–0.2)
nRBC: 12.4 % — ABNORMAL HIGH (ref 0.0–0.2)
nRBC: 11.9 % — ABNORMAL HIGH (ref 0.0–0.2)

## 2019-01-29 LAB — GLUCOSE, CAPILLARY
Glucose-Capillary: 145 mg/dL — ABNORMAL HIGH (ref 70–99)
Glucose-Capillary: 146 mg/dL — ABNORMAL HIGH (ref 70–99)
Glucose-Capillary: 150 mg/dL — ABNORMAL HIGH (ref 70–99)
Glucose-Capillary: 152 mg/dL — ABNORMAL HIGH (ref 70–99)
Glucose-Capillary: 155 mg/dL — ABNORMAL HIGH (ref 70–99)
Glucose-Capillary: 157 mg/dL — ABNORMAL HIGH (ref 70–99)
Glucose-Capillary: 157 mg/dL — ABNORMAL HIGH (ref 70–99)
Glucose-Capillary: 158 mg/dL — ABNORMAL HIGH (ref 70–99)
Glucose-Capillary: 159 mg/dL — ABNORMAL HIGH (ref 70–99)
Glucose-Capillary: 162 mg/dL — ABNORMAL HIGH (ref 70–99)
Glucose-Capillary: 165 mg/dL — ABNORMAL HIGH (ref 70–99)
Glucose-Capillary: 167 mg/dL — ABNORMAL HIGH (ref 70–99)
Glucose-Capillary: 174 mg/dL — ABNORMAL HIGH (ref 70–99)

## 2019-01-29 LAB — COMPREHENSIVE METABOLIC PANEL
ALT: 29 U/L (ref 0–44)
ALT: 34 U/L (ref 0–44)
AST: 68 U/L — ABNORMAL HIGH (ref 15–41)
AST: 85 U/L — ABNORMAL HIGH (ref 15–41)
Albumin: 2.2 g/dL — ABNORMAL LOW (ref 3.5–5.0)
Albumin: 2 g/dL — ABNORMAL LOW (ref 3.5–5.0)
Alkaline Phosphatase: 46 U/L (ref 38–126)
Alkaline Phosphatase: 60 U/L (ref 38–126)
Anion gap: 8 (ref 5–15)
Anion gap: 11 (ref 5–15)
BUN: 52 mg/dL — ABNORMAL HIGH (ref 8–23)
BUN: 52 mg/dL — ABNORMAL HIGH (ref 8–23)
CO2: 28 mmol/L (ref 22–32)
CO2: 26 mmol/L (ref 22–32)
Calcium: 6.9 mg/dL — ABNORMAL LOW (ref 8.9–10.3)
Calcium: 7 mg/dL — ABNORMAL LOW (ref 8.9–10.3)
Chloride: 106 mmol/L (ref 98–111)
Chloride: 107 mmol/L (ref 98–111)
Creatinine, Ser: 1.62 mg/dL — ABNORMAL HIGH (ref 0.61–1.24)
Creatinine, Ser: 1.76 mg/dL — ABNORMAL HIGH (ref 0.61–1.24)
GFR calc Af Amer: 50 mL/min — ABNORMAL LOW (ref >60)
GFR calc Af Amer: 45 mL/min — ABNORMAL LOW (ref >60)
GFR calc non Af Amer: 43 mL/min — ABNORMAL LOW (ref >60)
GFR calc non Af Amer: 39 mL/min — ABNORMAL LOW (ref >60)
Glucose, Bld: 151 mg/dL — ABNORMAL HIGH (ref 70–99)
Glucose, Bld: 170 mg/dL — ABNORMAL HIGH (ref 70–99)
Potassium: 3.7 mmol/L (ref 3.5–5.1)
Potassium: 4.1 mmol/L (ref 3.5–5.1)
Sodium: 142 mmol/L (ref 135–145)
Sodium: 144 mmol/L (ref 135–145)
Total Bilirubin: 1.6 mg/dL — ABNORMAL HIGH (ref 0.3–1.2)
Total Bilirubin: 2.1 mg/dL — ABNORMAL HIGH (ref 0.3–1.2)
Total Protein: 4.1 g/dL — ABNORMAL LOW (ref 6.5–8.1)
Total Protein: 4 g/dL — ABNORMAL LOW (ref 6.5–8.1)

## 2019-01-29 LAB — VANCOMYCIN, TROUGH: Vancomycin Tr: 19 ug/mL (ref 15–20)

## 2019-01-29 LAB — DIC (DISSEMINATED INTRAVASCULAR COAGULATION)PANEL
D-Dimer, Quant: 0.89 ug/mL-FEU — ABNORMAL HIGH (ref 0.00–0.50)
Fibrinogen: 456 mg/dL (ref 210–475)
INR: 1.3 — ABNORMAL HIGH (ref 0.8–1.2)
Platelets: 60 10*3/uL — ABNORMAL LOW (ref 150–400)
Prothrombin Time: 16.4 seconds — ABNORMAL HIGH (ref 11.4–15.2)
Smear Review: NONE SEEN
aPTT: 78 seconds — ABNORMAL HIGH (ref 24–36)

## 2019-01-29 LAB — COOXEMETRY PANEL
Carboxyhemoglobin: 1.7 % — ABNORMAL HIGH (ref 0.5–1.5)
Methemoglobin: 1.2 % (ref 0.0–1.5)
O2 Saturation: 73.4 %
Total hemoglobin: 8.7 g/dL — ABNORMAL LOW (ref 12.0–16.0)

## 2019-01-29 LAB — LACTATE DEHYDROGENASE
LDH: 412 U/L — ABNORMAL HIGH (ref 98–192)
LDH: 458 U/L — ABNORMAL HIGH (ref 98–192)

## 2019-01-29 LAB — LACTIC ACID, PLASMA: Lactic Acid, Venous: 1.4 mmol/L (ref 0.5–1.9)

## 2019-01-29 LAB — PREPARE RBC (CROSSMATCH)

## 2019-01-29 LAB — CULTURE, BLOOD (ROUTINE X 2)
Culture: NO GROWTH
Special Requests: ADEQUATE

## 2019-01-29 LAB — APTT: aPTT: 78 seconds — ABNORMAL HIGH (ref 24–36)

## 2019-01-29 LAB — HEPARIN INDUCED PLATELET AB (HIT ANTIBODY): Heparin Induced Plt Ab: 0.052 OD (ref 0.000–0.400)

## 2019-01-29 LAB — PROTIME-INR
INR: 1.4 — ABNORMAL HIGH (ref 0.8–1.2)
Prothrombin Time: 16.6 seconds — ABNORMAL HIGH (ref 11.4–15.2)

## 2019-01-29 LAB — HEPARIN LEVEL (UNFRACTIONATED)
Heparin Unfractionated: 0.29 IU/mL — ABNORMAL LOW (ref 0.30–0.70)
Heparin Unfractionated: 0.26 IU/mL — ABNORMAL LOW (ref 0.30–0.70)

## 2019-01-29 LAB — PHOSPHORUS
Phosphorus: 3 mg/dL (ref 2.5–4.6)
Phosphorus: 2.8 mg/dL (ref 2.5–4.6)

## 2019-01-29 LAB — MAGNESIUM
Magnesium: 2.3 mg/dL (ref 1.7–2.4)
Magnesium: 2.2 mg/dL (ref 1.7–2.4)

## 2019-01-29 MED ORDER — MIDAZOLAM 50MG/50ML (1MG/ML) PREMIX INFUSION
0.5000 mg/h | INTRAVENOUS | Status: DC
  Administered 2019-01-29 – 2019-01-31 (×3): 0.5 mg/h via INTRAVENOUS
  Filled 2019-01-29 (×3): qty 50

## 2019-01-29 MED ORDER — INSULIN GLARGINE 100 UNIT/ML ~~LOC~~ SOLN
12.0000 [IU] | SUBCUTANEOUS | Status: DC
  Administered 2019-01-29 – 2019-01-31 (×3): 12 [IU] via SUBCUTANEOUS
  Filled 2019-01-29 (×4): qty 0.12

## 2019-01-29 MED ORDER — ROCURONIUM BROMIDE 50 MG/5ML IV SOLN
100.0000 mg | Freq: Once | INTRAVENOUS | Status: AC
  Administered 2019-01-29: 100 mg via INTRAVENOUS
  Filled 2019-01-29: qty 10

## 2019-01-29 MED ORDER — INSULIN ASPART 100 UNIT/ML ~~LOC~~ SOLN
0.0000 [IU] | SUBCUTANEOUS | Status: DC
  Administered 2019-01-29 (×2): 4 [IU] via SUBCUTANEOUS
  Administered 2019-01-30: 3 [IU] via SUBCUTANEOUS
  Administered 2019-01-30: 4 [IU] via SUBCUTANEOUS
  Administered 2019-01-30: 3 [IU] via SUBCUTANEOUS
  Administered 2019-01-30 – 2019-01-31 (×7): 4 [IU] via SUBCUTANEOUS
  Administered 2019-01-31: 3 [IU] via SUBCUTANEOUS
  Administered 2019-01-31: 4 [IU] via SUBCUTANEOUS
  Administered 2019-02-01: 3 [IU] via SUBCUTANEOUS
  Administered 2019-02-01 (×2): 4 [IU] via SUBCUTANEOUS
  Administered 2019-02-01: 5 [IU] via SUBCUTANEOUS
  Administered 2019-02-02 (×3): 3 [IU] via SUBCUTANEOUS
  Administered 2019-02-02 – 2019-02-03 (×5): 4 [IU] via SUBCUTANEOUS
  Administered 2019-02-03 (×2): 3 [IU] via SUBCUTANEOUS

## 2019-01-29 MED ORDER — POTASSIUM CHLORIDE 10 MEQ/50ML IV SOLN
10.0000 meq | INTRAVENOUS | Status: AC
  Administered 2019-01-29 (×4): 10 meq via INTRAVENOUS
  Filled 2019-01-29 (×4): qty 50

## 2019-01-29 MED ORDER — SODIUM CHLORIDE 0.9% IV SOLUTION
Freq: Once | INTRAVENOUS | Status: AC
  Administered 2019-01-29: via INTRAVENOUS

## 2019-01-29 MED ORDER — TRACE MINERALS CU-MN-SE-ZN 300-55-60-3000 MCG/ML IV SOLN
INTRAVENOUS | Status: DC
  Filled 2019-01-29: qty 582.4

## 2019-01-29 MED ORDER — SODIUM CHLORIDE 0.9% IV SOLUTION
Freq: Once | INTRAVENOUS | Status: AC
  Administered 2019-01-29: 11:00:00 via INTRAVENOUS

## 2019-01-29 MED ORDER — VITAL 1.5 CAL PO LIQD
1000.0000 mL | ORAL | Status: DC
  Administered 2019-01-29 – 2019-01-30 (×2): 1000 mL
  Filled 2019-01-29: qty 1000

## 2019-01-29 NOTE — Progress Notes (Addendum)
230 mL of versed wasted and witnessed with Penny Pia, RN in stericycle due to expiration. Vaso gtt also wasted d/t expiration. Pt has been off vaso for >12 hours.   Patient's wife, Philip Wolfe would like MD to call her for an update on POC in the morning. She reports that she did not get an update today from a doctor. She will be back to visit tomorrow around lunch time. Wife is very Patent attorney of all the care Yashar is receiving.

## 2019-01-29 NOTE — Progress Notes (Addendum)
NAME:  Philip Wolfe, MRN:  [id number], DOB:  06-10-1950, LOS: 6 ADMISSION DATE:  01/28/2019, CONSULTATION DATE:  02/02/2019 REFERRING MD:  Dr. Madilyn Hook EDP, CHIEF COMPLAINT:  Hypoxia   Brief History   68 year old male admitted 12/7 with massive PE s/p thrombolytics and catheter directed lysis.  Has cardiac arrest, significant RV failure requiring initiation of ECMO  Past Medical History   has a past medical history of Arthritis, Hypertension, Sleep apnea, Ulcer (left medial ankle), and Venous stasis ulcer (HCC).   Significant Hospital Events   12/7-Admit, intubated coded, given TPA 800 mg and then 50 mg 6 hours later 12/8- Cannulated for VA ECMO 01/27/2019 and underwent EKOS directed catheter placement.  Inhaled NO started 12/9- Significant epistaxis requiring 3 units PRBC and nasal packing by ENT, bronchoscopy with no evidence of pulmonary bleed 12/10- Second round of EKOS directed catheter TPA, nasal packing replaced by [facility] catheter by ENT 12/11-reviewed echo with cardiology.  RV function is improving.  Held off further lytics/thrombectomy 12/13-requiring daily blood transfusions.  No evidence of overt bleeding.  Pressors weaning down  Consults:  PCCM, cardiology, cardiothoracic surgery  Procedures:  R axillary arterial catheter, R femoral venous catheter, RIJ introducer, LIJ MML, L radial artery  Significant Diagnostic Tests:  CTA 12/7-massive bilateral central pulmonary emboli with significant decrease in pulmonary artery flow, patchy groundglass infiltrate, rib fractures on the right.  CT head 12/7-generalized atrophy, chronic ischemic microangiopathy.  Echo 12/9-severe reduction in RV systolic function with RV volume and pressure overload.  LVEF 50-55%  Micro Data:  Blood cultures 12/7-negative Blood culture 12/8-negative MRSA PCR 12/8-negative Respiratory culture 12/10-negative  Antimicrobials:  Cefepime 12/10 > Vanco 12/9 >> 12/13  Interim history/subjective:  Remains on  ECMO.   Continues on Levophed.  He is off vasopressin.  Objective   Blood pressure 103/69, pulse 94, temperature 97.7 F (36.5 C), resp. rate 12, height 5\' 9"  (1.753 m), weight (!) 152.7 kg, SpO2 100 %. CVP:  [3 mmHg-12 mmHg] 5 mmHg  Vent Mode: PCV FiO2 (%):  [30 %-40 %] 30 % Set Rate:  [12 bmp] 12 bmp PEEP:  [5 cmH20] 5 cmH20 Plateau Pressure:  [2 cmH20-18 cmH20] 18 cmH20   Intake/Output Summary (Last 24 hours) at 01/29/2019 01/31/2019 Last data filed at 01/29/2019 0700 Gross per 24 hour  Intake 4951.29 ml  Output 5945 ml  Net -993.71 ml   Filed Weights   01/27/19 0600 01/28/19 0600 01/29/19 0630  Weight: (!) 150.2 kg (!) 153.4 kg (!) 152.7 kg   Examination: Gen:      No acute distress HEENT:  EOMI, sclera anicteric Neck:     No masses; no thyromegaly, ETT Lungs:    Clear to auscultation bilaterally; normal respiratory effort CV:         Regular rate and rhythm; no murmurs Abd:      + bowel sounds; soft, non-tender; no palpable masses, no distension Ext:    1-2+  edema; adequate peripheral perfusion Skin:      Warm and dry; no rash Neuro: Sedated, unresponsive  Resolved Hospital Problem list     Assessment & Plan:  Acute cor pulmonale due to massive PE-  S/p TPA x 2 and EKOS X 2 On VA ECMO axillary-femoral cannulation. Wean pressors as tolerated Continue heparin drip  Acute hypoxic, hypercarbic respiratory failure Continue pressure control ventilation  Leukocytosis, low hemoglobin with no evidence of active bleed Continue empiric antibiotic for leukocytosis.  Will DC Vanco as kidney function is  worsening. Continue cefepime for 7 days Transfuse PRBC as needed.  Check DIC, hemolysis panel  Elevated LFTs Likely secondary to shock.  Continue monitoring  AKI On Lasix drip Monitor BUN/creatinine  Epistaxis S/p packing.  Continue monitoring CBC, transfuse as needed  Hyperglycemia Insulin drip  Labs/imaging personally reviewed.  Significant for  SCV O2  73 BUN/creatinine 52/1.60, AST 68, total bilirubin 1.6 WBC 28.9, hemoglobin 8.1, platelets 60  Chest x-ray 12/13-bilateral pleural effusion with lower lobe opacities.  Best practice:  Diet: NPO Pain/Anxiety/Delirium protocol (if indicated): Weaning off Versed, fentanyl VAP protocol (if indicated): Ordered DVT prophylaxis: Heparin Drip GI prophylaxis: Pepcid Glucose control: Insulin drip Mobility: Bed Code Status: Full Family Communication: Updated at bedside Disposition: ICU  Critical care time:     The patient is critically ill with multiple organ system failure and requires high complexity decision making for assessment and support, frequent evaluation and titration of therapies, advanced monitoring, review of radiographic studies and interpretation of complex data.   Critical Care Time devoted to patient care services, exclusive of separately billable procedures, described in this note is 35 minutes.   Marshell Garfinkel MD [facility] 01/29/2019, 7:22 AM

## 2019-01-29 NOTE — Procedures (Signed)
ETT tube exchange  ET tube exchange performed as old ET tube had dried secretions and old blood causing high airway pressures and low tidal volumes  ET tube exchanger introduced through the old 7.5 ET tube which is then removed New 8.5 endotracheal tube introduced over the exchanger and attached to the ventilator Proper placement of ETT in the trachea confirmed visually with bronchoscope. Patient tolerated procedure well.  Marshell Garfinkel MD [facility] Please see [email or web address] for pager details.  01/29/2019, 3:44 PM

## 2019-01-29 NOTE — Progress Notes (Signed)
RT NOTE: RT weaned nitric to 15ppm per Dr.McLean order. Patient tolerated well.   RT and RN noticed patient tidal volumes and minute ventilation are low and asked Dr.Mannam if he wanted to make any vent changes. Dr.Mannam asked RT to decrease PC from 16 to 15 and said to continue with other settings. RT asked Dr.Mannam if he was okay with a minute ventilation alarm at 2 due to the minute ventilation reading 2.3 and he said yes. RT also asked if he was okay with the tidal volumes remaining in the 200-300's and he said yes. RN and ECMO specialist at bedside as well. RT will continue to monitor.

## 2019-01-29 NOTE — Progress Notes (Signed)
Subjective: Sedated. Not responsive. Slight oozing noted from the right nostril. No significant pharyngeal bleeding.  Objective: Vital signs in last 24 hours: Temp:  [97.5 F (36.4 C)-99.1 F (37.3 C)] 97.9 F (36.6 C) (12/13 1528) Pulse Rate:  [80-108] 104 (12/13 1528) Resp:  [0-21] 12 (12/13 1528) BP: (89-113)/(62-74) 99/67 (12/13 1528) SpO2:  [100 %] 100 % (12/13 1528) Arterial Line BP: (85-123)/(62-81) 123/73 (12/13 1515) FiO2 (%):  [30 %] 30 % (12/13 1528) Weight:  [152.7 kg] 152.7 kg (12/13 0630)  Physical exam: General appearance:Morbidly obese andintubated. Sedated. Not responsive. Head:Normocephalic, without obvious abnormality, atraumatic Ears: Examination of the ears shows normal auricles and external auditory canals bilaterally.  Nose: Nasal packing in placebilaterally. No significant bleeding. Face: Facial examination shows no asymmetry.  Mouth:ET tube in place.No more pharyngeal bleeding. Neck:Palpation of the neck reveals no lymphadenopathy or mass. The trachea is midline. The thyroid is not significantly enlarged.  Neuro:Sedated. Not responsive.  Recent Labs    01/29/19 0431 01/29/19 0750 01/29/19 0954 01/29/19 1556  WBC 28.9*  --  27.0*  --   HGB 8.1*  --  7.6* 7.8*  HCT 23.4*  --  21.8* 23.0*  PLT 60* 60* 66*  --    Recent Labs    01/28/19 1703 01/29/19 0431 01/29/19 1556  NA 140 142 141  K 3.8 3.7 4.0  CL 106 106  --   CO2 27 28  --   GLUCOSE 161* 151*  --   BUN 53* 52*  --   CREATININE 1.55* 1.62*  --   CALCIUM 6.4* 6.9*  --     Medications:  I have reviewed the patient's current medications. Scheduled: . sodium chloride   Intravenous Once  . acetaminophen  1,000 mg Oral Q6H   Or  . acetaminophen (TYLENOL) oral liquid 160 mg/5 mL  1,000 mg Per Tube Q6H  . bisacodyl  10 mg Oral Daily   Or  . bisacodyl  10 mg Rectal Daily  . chlorhexidine gluconate (MEDLINE KIT)  15 mL Mouth Rinse BID  . Chlorhexidine Gluconate Cloth  6 each  Topical Daily  . docusate sodium  200 mg Oral Daily  . feeding supplement (PRO-STAT SUGAR FREE 64)  30 mL Per Tube TID  . insulin aspart  0-20 Units Subcutaneous Q4H  . insulin glargine  12 Units Subcutaneous Q24H  . mouth rinse  15 mL Mouth Rinse 10 times per day  . metoCLOPramide (REGLAN) injection  10 mg Intravenous Q6H  . pantoprazole (PROTONIX) IV  40 mg Intravenous Q12H  . sodium chloride flush  10-40 mL Intracatheter Q12H  . sodium chloride flush  3 mL Intravenous Q12H   Continuous: . sodium chloride 10 mL/hr at 01/29/19 1607  . ceFEPime (MAXIPIME) IV Stopped (01/29/19 1004)  . dexmedetomidine (PRECEDEX) IV infusion Stopped (01/26/19 1143)  . epinephrine Stopped (01/28/19 5035)  . feeding supplement (VITAL 1.5 CAL) 1,000 mL (01/29/19 1125)  . fentaNYL infusion INTRAVENOUS 350 mcg/hr (01/29/19 1607)  . furosemide (LASIX) infusion 8 mg/hr (01/29/19 1607)  . heparin 1,400 Units/hr (01/29/19 1607)  . insulin Stopped (01/29/19 1338)  . lactated ringers 10 mL/hr at 01/25/19 1031  . lactated ringers Stopped (01/27/2019 2159)  . midazolam (VERSED) 250 mg in sodium chloride 0.9% 250 mL infusion 1 mL/hr at 01/29/19 1608  . norepinephrine (LEVOPHED) Adult infusion 21 mcg/min (01/29/19 1607)  . TPN ADULT (ION)    . vasopressin (PITRESSIN) infusion - *FOR SHOCK* Stopped (01/28/19 2245)    Assessment/Plan: Recent  severe bilateralepistaxiss/p TPA treatment for massive pulmonary emboli.Bleeding is finally under control. - Will leave packing in place due to his high risk of bleeding. - Will need antibiotic (Gram + coverage) while packing is in place.   LOS: 6 days   Devaris Quirk W Sia Gabrielsen 01/29/2019, 4:09 PM

## 2019-01-29 NOTE — Progress Notes (Signed)
ANTICOAGULATION CONSULT NOTE   Pharmacy Consult for Heparin  Indication: pulmonary embolus + ECMO  No Known Allergies  Patient Measurements: Height: 5\' 9"  (175.3 cm) Weight: (!) 338 lb 3 oz (153.4 kg) IBW/kg (Calculated) : 70.7 Heparin Dosing Weight: 103 kg  Vital Signs: Temp: 97.7 F (36.5 C) (12/13 0500) BP: 103/69 (12/13 0427) Pulse Rate: 93 (12/13 0427)  Labs: Recent Labs    01/27/19 0446 01/27/19 0621 01/27/19 1545 01/27/19 1630 01/28/19 0428 01/28/19 0550 01/28/19 1703 01/28/19 1818 01/28/19 2004 01/28/19 2104 01/28/19 2304 01/29/19 0420 01/29/19 0431 01/29/19 0500  HGB 8.0*  --  7.1*   < > 7.0*  --  8.5* 8.1*  --  7.8* 7.1* 7.5* 8.1*  --   HCT 23.4*  --  19.8*   < > 20.6*  --  24.8* 23.5*  --  22.9* 21.0* 22.0* 23.4*  --   PLT 54*   < > 50*   < > 65*  --  57* 57*  --  60*  --   --  60*  --   APTT 130*  --  116*  --  126*  --   --   --   --   --   --   --  78*  --   LABPROT 16.5*  --   --   --  16.6*  --   --   --   --   --   --   --  16.6*  --   INR 1.3*  --   --   --  1.4*  --   --   --   --   --   --   --  1.4*  --   HEPARINUNFRC 0.42   < >  --   --   --  0.36  --   --  0.27*  --   --   --   --  0.29*  CREATININE 1.27*  --  1.44*  --  1.70*  --  1.55*  --   --   --   --   --  1.62*  --    < > = values in this interval not displayed.    Estimated Creatinine Clearance: 65 mL/min (A) (by C-G formula based on SCr of 1.62 mg/dL (H)).  Assessment: 31 yoM admitted with massive PE and RV failure. Pt received 150mg  total of systemic tPA with continued shock so ECMO started 12/8. EKOS started along with IV heparin, then repeated 12/10 with significant clot burden. RV function improving on ECHO 12/11 - holding off further tPA/thrombectomy  Heparin level remains therapeutic at 0.29 units/ml  Goal of Therapy:  Heparin level 0.25-0.5 units/mL per MD (while on ECMO) Monitor platelets by anticoagulation protocol: Yes    Plan:  -Continue heparin 1400  units/hr -Check heparin level (0500/1700) -Monitor for bleeding  Thanks for allowing pharmacy to be a part of this patient's care.  Excell Seltzer, PharmD Clinical Pharmacist  01/29/2019, 5:26 AM

## 2019-01-29 NOTE — Progress Notes (Signed)
RT NOTE: Dr.Smith changed vent mode from PCV to Bi-Vent with RT and RN at bedside. RT will continue to monitor.

## 2019-01-29 NOTE — Procedures (Signed)
Bronchoscopy Procedure Note Philip Wolfe [id number] 03/28/1950  Procedure: Bronchoscopy Indications: Diagnostic evaluation of the airways  Procedure Details Consent: Risks of procedure as well as the alternatives and risks of each were explained to the (patient/caregiver).  Consent for procedure obtained. Time Out: Verified patient identification, verified procedure, site/side was marked, verified correct patient position, special equipment/implants available, medications/allergies/relevent history reviewed, required imaging and test results available.  Performed  In preparation for procedure, patient was given 100% FiO2 and bronchoscope lubricated. Sedation: Muscle relaxants  Airway entered and the following bronchi were examined: RUL, RML, RLL, LUL, LLL and Bronchi.  ET tube and main care trachea was crusted with dried blood, secretions which were suctioned out.  Airways themselves were clear with thin white secretions.  No mucous plugs noted.   Procedures performed: Bronchial washing lower lobe.  Specimen sent for culture   Evaluation Hemodynamic Status: BP stable throughout; O2 sats: stable throughout Patient's Current Condition: stable Specimens:  Sent serosanguinous fluid Complications: No apparent complications Patient did tolerate procedure well.   Gurinder Toral 01/29/2019

## 2019-01-29 NOTE — Progress Notes (Signed)
5 Days Post-Op Procedure(s) (LRB): CANNULATION FOR VA ECMO (EXTRACORPOREAL MEMBRANE OXYGENATION) (N/A) TRANSESOPHAGEAL ECHOCARDIOGRAM (TEE) (N/A)   Stable day 5 on VA ECMO RV fx improving- off epi, vasopressin Hb 8.1, received 3 u PRBC yesterday Objective: Vital signs in last 24 hours: Temp:  [97.5 F (36.4 C)-99.1 F (37.3 C)] 97.5 F (36.4 C) (12/13 0945) Pulse Rate:  [78-96] 96 (12/13 0822) Cardiac Rhythm: Normal sinus rhythm (12/13 0800) Resp:  [0-49] 0 (12/13 0945) BP: (98-115)/(64-75) 98/67 (12/13 0822) SpO2:  [100 %] 100 % (12/13 0822) Arterial Line BP: (85-124)/(62-81) 98/69 (12/13 0945) FiO2 (%):  [30 %] 30 % (12/13 0822) Weight:  [152.7 kg] 152.7 kg (12/13 0630)  Hemodynamic parameters for last 24 hours: CVP:  [3 mmHg-12 mmHg] 6 mmHg  Intake/Output from previous day: 12/12 0701 - 12/13 0700 In: 4951.3 [I.V.:2732.9; Blood:1039; NG/GT:250; IV Piggyback:929.4] Out: 5945 [Urine:5295; Emesis/NG output:650] Intake/Output this shift: Total I/O In: 222.4 [I.V.:93.8; NG/GT:120; IV Piggyback:8.6] Out: 625 [Urine:625]  Sedated Coarse breath sounds Abd soft  Lab Results: Recent Labs    01/28/19 2104 01/29/19 0420 01/29/19 0431 01/29/19 0750  WBC 31.1*  --  28.9*  --   HGB 7.8* 7.5* 8.1*  --   HCT 22.9* 22.0* 23.4*  --   PLT 60*  --  60* 60*   BMET:  Recent Labs    01/28/19 1703 01/29/19 0420 01/29/19 0431  NA 140 142 142  K 3.8 3.6 3.7  CL 106  --  106  CO2 27  --  28  GLUCOSE 161*  --  151*  BUN 53*  --  52*  CREATININE 1.55*  --  1.62*  CALCIUM 6.4*  --  6.9*    PT/INR:  Recent Labs    01/29/19 0750  LABPROT 16.4*  INR 1.3*   ABG    Component Value Date/Time   PHART 7.501 (H) 01/29/2019 0420   HCO3 30.9 (H) 01/29/2019 0420   TCO2 32 01/29/2019 0420   ACIDBASEDEF 3.0 (H) 01/26/2019 0555   O2SAT 73.4 01/29/2019 0630   CBG (last 3)  Recent Labs    01/29/19 0515 01/29/19 0721 01/29/19 0922  GLUCAP 150* 152* 146*     Assessment/Plan: S/P Procedure(s) (LRB): CANNULATION FOR VA ECMO (EXTRACORPOREAL MEMBRANE OXYGENATION) (N/A) TRANSESOPHAGEAL ECHOCARDIOGRAM (TEE) (N/A) resume TF low rare Cont lasix 8mg /hr Transition to SSI  Q4h Repeat CTA tomorrow  LOS: 6 days    Philip Wolfe 01/29/2019

## 2019-01-29 NOTE — Progress Notes (Addendum)
ANTICOAGULATION CONSULT NOTE   Pharmacy Consult for Heparin  Indication: pulmonary embolus + ECMO  No Known Allergies  Patient Measurements: Height: 5\' 9"  (175.3 cm) Weight: (!) 336 lb 10.3 oz (152.7 kg) IBW/kg (Calculated) : 70.7 Heparin Dosing Weight: 103 kg  Vital Signs: Temp: 98.1 F (36.7 C) (12/13 1730) Temp Source: Bladder (12/13 1100) BP: 99/67 (12/13 1528) Pulse Rate: 103 (12/13 1530)  Labs: Recent Labs    01/28/19 0428 01/28/19 0550 01/28/19 1703 01/28/19 1707 01/28/19 2004 01/29/19 0431 01/29/19 0500 01/29/19 0750 01/29/19 0954 01/29/19 1549 01/29/19 1556 01/29/19 1726  HGB 7.0*   < > 8.5*  --   --  8.1*  --   --  7.6* 8.4* 7.8* 7.8*  HCT 20.6*   < > 24.8*  --   --  23.4*  --   --  21.8* 24.3* 23.0* 23.0*  PLT 65*   < > 57*   < >  --  60*  --  60* 66* 63*  --   --   APTT 126*  --   --   --   --  78*  --  78*  --   --   --   --   LABPROT 16.6*  --   --   --   --  16.6*  --  16.4*  --   --   --   --   INR 1.4*  --   --   --   --  1.4*  --  1.3*  --   --   --   --   HEPARINUNFRC  --   --   --   --  0.27*  --  0.29*  --   --  0.26*  --   --   CREATININE 1.70*  --  1.55*  --   --  1.62*  --   --   --  1.76*  --   --    < > = values in this interval not displayed.    Estimated Creatinine Clearance: 59.6 mL/min (A) (by C-G formula based on SCr of 1.76 mg/dL (H)).  Assessment: 32 yoM admitted with massive PE and RV failure. Pt received 150mg  total of systemic tPA with continued shock so ECMO started 12/8. EKOS started along with IV heparin, then repeated 12/10 with significant clot burden. RV function improving on ECHO 12/11 - holding off further tPA/thrombectomy  Heparin level remains therapeutic at 0.26 units/mL.  Noted documentation of slight oozing from right nostril.  Goal of Therapy:  Heparin level 0.25-0.5 units/mL per MD (while on ECMO) Monitor platelets by anticoagulation protocol: Yes    Plan:  -Continue heparin 1400 units/hr -Check heparin  level (0500/1700) -Monitor for bleeding  Rolena Knutson D. Mina Marble, PharmD, BCPS, [place] 01/29/2019, 6:33 PM  ============================  Gay Filler with ECMO team reached out and would like to aim for goal heparin level 0.3-0.5 units/mL since patient is not bleeding.  Increase heparin gtt to 1450 units/hr F/U AM labs and see if going back to 1500 units/hr is needed  Vesper Trant D. Mina Marble, PharmD, BCPS, [place] 01/29/2019, 8:58 PM

## 2019-01-29 NOTE — Progress Notes (Signed)
Patient ID: Philip Wolfe, male   DOB: [birth year], 68 y.o.   MRN: [id number]     Advanced Heart Failure Rounding Note  PCP-Cardiologist: No primary care provider on file.   Subjective:    - Massive PE with cardiogenic shock: VA ECMO initiated 12/8. Patient then had PA catheter-directed tPA until 8 am 12/9.  Left leg DVT.  - 12/9 ENT Merocel packing for severe epistaxis.  - PA catheter-directed tPA 12/20 again.    Nasopharyngeal bleeding seems to have resolved after nasal packing by ENT.   Hgb 8.1, has had 1 unit PRBCs overnight, no obvious bleeding.  Plts 60K.   Slowly weaning pressors.  Now off epinephrine and vasopressin and on norepinephrine 24.  MAP 80s.  CVP 9 on Lasix gtt 8 mg/hr, net negative 1 L with decreased anasarca.  Heparin gtt ongoing with therapeutic level. iNO at 20 ppm. Co-ox 73% this morning. LDH 370 => 412. Echo yesterday difficult windows but RV remains hypokinetic (not as bad as initially).   Tube feeds stopped as not post-pyloric.   Empiric vancomycin/cefepime ongoing, afebrile, cultures remain negative.  WBCs high 39 => 32 => 29.  Sedated on Versed/Fentanyl. Will open eyes and respond to pain with sedation wean but has not had full wean due to vent dyssynchrony.   ECMO: Stable, no chugging.  Flow 4.5 L/min  3600 rpm Venous pressure -93 Delta P 24  Objective:   Weight Range: (!) 152.7 kg Body mass index is 49.71 kg/m.   Vital Signs:   Temp:  [97.5 F (36.4 C)-99.1 F (37.3 C)] 97.7 F (36.5 C) (12/13 0700) Pulse Rate:  [78-96] 94 (12/13 0600) Resp:  [0-49] 12 (12/13 0700) BP: (98-115)/(64-75) 103/69 (12/13 0427) SpO2:  [100 %] 100 % (12/13 0600) Arterial Line BP: (85-124)/(61-81) 100/70 (12/13 0700) FiO2 (%):  [30 %-40 %] 30 % (12/13 0427) Weight:  [152.7 kg] 152.7 kg (12/13 0630) Last BM Date: (pta)  Weight change: Filed Weights   01/27/19 0600 01/28/19 0600 01/29/19 0630  Weight: (!) 150.2 kg (!) 153.4 kg (!) 152.7 kg     Intake/Output:   Intake/Output Summary (Last 24 hours) at 01/29/2019 0734 Last data filed at 01/29/2019 0700 Gross per 24 hour  Intake 4951.29 ml  Output 5945 ml  Net -993.71 ml      Physical Exam    General: Intubated/sedated.  Neck: Thick, unable to assess JVP, no thyromegaly or thyroid nodule.  Lungs: Decreased at bases CV: Nonpalpable PMI.  Heart distant, regular S1/S2, no S3/S4, no murmur. Diffuse anasarca.  Peripheral pulses are all dopplerable.   Abdomen: Soft, nontender, no hepatosplenomegaly, no distention.  Skin: Intact without lesions or rashes.  Neurologic: Sedated on vent Extremities: No clubbing or cyanosis.  HEENT: Normal.    Telemetry   NSR 90s (personally reviewed)  Labs    CBC Recent Labs    01/28/19 2104 01/29/19 0420 01/29/19 0431  WBC 31.1*  --  28.9*  HGB 7.8* 7.5* 8.1*  HCT 22.9* 22.0* 23.4*  MCV 91.2  --  91.1  PLT 60*  --  60*   Basic Metabolic Panel Recent Labs    01/28/19 1703 01/29/19 0420 01/29/19 0431  NA 140 142 142  K 3.8 3.6 3.7  CL 106  --  106  CO2 27  --  28  GLUCOSE 161*  --  151*  BUN 53*  --  52*  CREATININE 1.55*  --  1.62*  CALCIUM 6.4*  --  6.9*  MG  2.3  --  2.3  PHOS 2.7  --  3.0   Liver Function Tests Recent Labs    01/28/19 1703 01/29/19 0431  AST 51* 68*  ALT 25 29  ALKPHOS 45 46  BILITOT 1.2 1.6*  PROT 3.8* 4.1*  ALBUMIN 2.0* 2.2*   No results for input(s): LIPASE, AMYLASE in the last 72 hours. Cardiac Enzymes No results for input(s): CKTOTAL, CKMB, CKMBINDEX, TROPONINI in the last 72 hours.  BNP: BNP (last 3 results) Recent Labs    02/02/2019 1505  BNP 57.6    ProBNP (last 3 results) No results for input(s): PROBNP in the last 8760 hours.   D-Dimer No results for input(s): DDIMER in the last 72 hours. Hemoglobin A1C No results for input(s): HGBA1C in the last 72 hours. Fasting Lipid Panel No results for input(s): CHOL, HDL, LDLCALC, TRIG, CHOLHDL, LDLDIRECT in the last 72  hours. Thyroid Function Tests No results for input(s): TSH, T4TOTAL, T3FREE, THYROIDAB in the last 72 hours.  Invalid input(s): FREET3  Other results:   Imaging    ECHOCARDIOGRAM LIMITED  Result Date: 01/28/2019   ECHOCARDIOGRAM LIMITED REPORT   Patient Name:   Philip Wolfe Date of Exam: 01/28/2019 Medical Rec #:  [id number]     Height:       69.0 in Accession #:    [id number]    Weight:       338.2 lb Date of Birth:  [birth year]      BSA:          2.58 m Patient Age:    69 years      BP:           109/72 mmHg Patient Gender: M             HR:           83 bpm. Exam Location:  Inpatient  Procedure: Limited Echo, Limited Color Doppler and Cardiac Doppler Indications:    I26.02 Pulmonary embolus  History:        Patient has prior history of Echocardiogram examinations, most                 recent 01/27/2019.  Sonographer:    Tiffany Dance Referring Phys: [place] Comments: Technically difficult study due to poor echo windows, suboptimal apical window and echo performed with patient supine and on artificial respirator. Definity not used to due to incompatibility with ECMO. IMPRESSIONS  1. Limited study with suboptimal RV visualization  2. LVEF 50-55%, mild LVH  3. RV poorly visualized but appears to have at least mildly depressed systolic function.  4. The tricuspid valve is grossly normal. Tricuspid valve regurgitation is trivial.  5. Aortic dilatation noted.  6. 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  7. Mildly elevated pulmonary artery systolic pressure.  8. Aortic root could not be assessed.  9. The aortic valve was not assessed. Aortic valve regurgitation is not visualized. 10. Left ventricular ejection fraction, by visual estimation, is 50 to 55%. The left ventricle has low normal function. There is mildly increased left ventricular hypertrophy. 11. The mitral valve was not assessed. No evidence of mitral valve regurgitation. 12. The pulmonic valve  was not assessed. Pulmonic valve regurgitation is not visualized. 13. Global right ventricle has mildly reduced systolic function.The right ventricular size is mildly enlarged. No increase in right ventricular wall thickness. FINDINGS  Left Ventricle: Left ventricular ejection fraction, by visual estimation, is 50  to 55%. The left ventricle has low normal function. There is mildly increased left ventricular wall thickness. Right Ventricle: The right ventricular size is mildly enlarged. No increase in right ventricular wall thickness. Global RV systolic function is has mildly reduced systolic function. The tricuspid regurgitant velocity is 1.87 m/s, and with an assumed right atrial pressure of 8 mmHg, the estimated right ventricular systolic pressure is mildly elevated at 22.0 mmHg. RV poorly visualized but appears to have at least mildly depressed systolic function. Pericardium: There is no evidence of pericardial effusion. Tricuspid Valve: The tricuspid valve is grossly normal. Tricuspid valve regurgitation is trivial. Aortic Valve: The aortic valve was not assessed. Pulmonic Valve: The pulmonic valve was not assessed. Pulmonic valve regurgitation is not visualized. Aorta: Aortic root could not be assessed. The aortic root and ascending aorta are structurally normal, with no evidence of dilitation and aortic dilatation noted. 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  LEFT VENTRICLE          Normals PLAX 2D LVIDd:         4.80 cm  3.6 cm LVIDs:         3.59 cm  1.7 cm LV PW:         1.19 cm  1.4 cm LV IVS:        1.39 cm  1.3 cm LVOT diam:     1.90 cm  2.0 cm LV SV:         53 ml    79 ml LV SV Index:   18.97    45 ml/m2 LVOT Area:     2.84 cm 3.14 cm2  RIGHT VENTRICLE             IVC RV S prime:     11.90 cm/s  IVC diam: 2.17 cm TAPSE (M-mode): 1.4 cm LEFT ATRIUM         Index LA diam:    2.90 cm 1.12 cm/m  AORTIC VALVE             Normals LVOT Vmax:   108.00 cm/s LVOT Vmean:  67.200 cm/s  75 cm/s LVOT VTI:    0.155 m     25.3 cm  AORTA                 Normals Ao Root diam: 3.90 cm 31 mm Ao Asc diam:  4.10 cm 31 mm TRICUSPID VALVE             Normals TR Peak grad:   14.0 mmHg TR Vmax:        198.00 cm/s 288 cm/s  SHUNTS Systemic VTI:  0.16 m Systemic Diam: 1.90 cm  Lyman Bishop MD Electronically signed by Lyman Bishop MD Signature Date/Time: 01/28/2019/4:40:25 PM    Final      Medications:     Scheduled Medications: . sodium chloride   Intravenous Once  . acetaminophen  1,000 mg Oral Q6H   Or  . acetaminophen (TYLENOL) oral liquid 160 mg/5 mL  1,000 mg Per Tube Q6H  . bisacodyl  10 mg Oral Daily   Or  . bisacodyl  10 mg Rectal Daily  . chlorhexidine gluconate (MEDLINE KIT)  15 mL Mouth Rinse BID  . Chlorhexidine Gluconate Cloth  6 each Topical Daily  . docusate sodium  200 mg Oral Daily  . feeding supplement (PRO-STAT SUGAR FREE 64)  30 mL Per Tube TID  . mouth rinse  15 mL Mouth Rinse 10 times  per day  . metoCLOPramide (REGLAN) injection  10 mg Intravenous Q6H  . pantoprazole (PROTONIX) IV  40 mg Intravenous Q12H  . sodium chloride flush  10-40 mL Intracatheter Q12H  . sodium chloride flush  3 mL Intravenous Q12H    Infusions: . sodium chloride 10 mL/hr at 01/29/19 0700  . ceFEPime (MAXIPIME) IV Stopped (01/29/19 0224)  . dexmedetomidine (PRECEDEX) IV infusion Stopped (01/26/19 1143)  . epinephrine Stopped (01/28/19 4540)  . feeding supplement (VITAL 1.5 CAL) Stopped (01/28/19 0830)  . fentaNYL infusion INTRAVENOUS 375 mcg/hr (01/29/19 0700)  . furosemide (LASIX) infusion 8 mg/hr (01/29/19 0700)  . heparin 1,400 Units/hr (01/29/19 0700)  . insulin 2.6 mL/hr at 01/29/19 0700  . lactated ringers 10 mL/hr at 01/25/19 1031  . lactated ringers Stopped (01/29/2019 2159)  . midazolam (VERSED) 250 mg in sodium chloride 0.9% 250 mL infusion 1 mL/hr at 01/29/19 0700  . norepinephrine (LEVOPHED) Adult infusion 24 mcg/min (01/29/19 0700)  . potassium chloride    .  vasopressin (PITRESSIN) infusion - *FOR SHOCK* Stopped (01/28/19 2245)    PRN Medications: sodium chloride, bisacodyl, calcium chloride, dextrose, fentaNYL, fentaNYL, metoprolol tartrate, midazolam, midazolam, morphine injection, ondansetron (ZOFRAN) IV, oxyCODONE, sodium chloride flush, sodium chloride flush, traMADol   Assessment/Plan   1.  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  VA ECMO initiated 12/8.  Currently good flow (4-5 L/min range) at 3600 rpm.  Still with significant pressor requirement despite catheter-directed tPA x 2 now.  He remains on norepinephrine 24, off vasopressin and epinephrine.  Echo from yesterday reviewed, RV still hypokinetic though with some improvement from the initial echo.  MAP 80s with CVP 10. Co-ox 76%. Now on Lasix gtt at 8 mg/hr with net negative I/Os and CVP 9.  Creatinine stable at 1.6. - Titrate down on norepinephrine today.   - Decrease iNO from 20 => 15 ppm, would like him eventually on sildenafil.   - Aim for repeat CTA on Monday to see if we continue slow weaning of support or need to consider surgical thromboendarterectomy.   - Continue Lasix gtt 8 mg/hr and replace K.  2. AKI: Creatinine stable at 1.6, follow closely.     3. Acute hypoxic/hypoxemic respiratory failure: FiO2 0.4, improved ABG on ECMO.  4. ID: WBCs elevated but afebrile.  No PNA on CXR.  PCT not elevated initially.  Blood and trach aspirate cultures so far negative.  - Empiric coverage with cefepime and vancomycin.  5. Pulmonary embolus: Massive bilateral PE with left leg DVT.  He got 150 mg IV total tPA initially, then catheter directed tPA to both PAs on 12/9 and again on 12/10.  Discussed surgical thrombectomy some improvement in RV appearance by echo, holding off for now.   - Continue to review with cardiac surgeons => as above, CTA chest Monday and will reassess need for attempt at surgical treatment.    - Continue heparin gtt, level  therapeutic.  6. ENT: ENT bleeding, has had nasal packing by ENT, resolved.  7. Acute blood loss anemia: ENT bleeding initially but this has slowed.  Not certain of current source of blood loss, no overt GI bleeding and LDH is stable.  Transfuse hgb < 8. He had 1 more unit PRBCs last night.   - Continue IV Protonix.  8. Thrombocytopenia: Plts 60 K this morning.  Suspect due to critical illness.  - Transfuse plts < 50K.  9. FEN: We do not have a  post-pyloric tube so no feeds currently.  - TPN today.  - See if we can get a post-pyloric tube (?IR) Monday.  10. Neuro: Lighten sedation to see if we can wake up this morning.   CRITICAL CARE Performed by: Loralie Champagne  Total critical care time: 45 minutes  Critical care time was exclusive of separately billable procedures and treating other patients.  Critical care was necessary to treat or prevent imminent or life-threatening deterioration.  Critical care was time spent personally by me on the following activities: development of treatment plan with patient and/or surrogate as well as nursing, discussions with consultants, evaluation of patient's response to treatment, examination of patient, obtaining history from patient or surrogate, ordering and performing treatments and interventions, ordering and review of laboratory studies, ordering and review of radiographic studies, pulse oximetry and re-evaluation of patient's condition.   Length of Stay: 6  Loralie Champagne, MD  01/29/2019, 7:34 AM  Advanced Heart Failure Team Pager [phone] (M-F; 7a - 4p)  Please contact [facility] for night-coverage after hours (4p -7a ) and weekends on [email or web address]

## 2019-01-29 NOTE — Progress Notes (Signed)
PHARMACY - TOTAL PARENTERAL NUTRITION CONSULT NOTE  Indication:  Intolerance to enteral feeding  Patient Measurements: Height: 5\' 9"  (175.3 cm) Weight: (!) 336 lb 10.3 oz (152.7 kg) IBW/kg (Calculated) : 70.7 TPN AdjBW (KG): 89.7 Body mass index is 49.71 kg/m.  Assessment:  Philip Wolfe presented on 02/04/2019 with SOB, decompensated in the ED and required alteplase for high suspicion of PE.  Massive PE confirmed and patient also has respiratory failure and RV failure/cardiogenic shock.  He was placed on ECMO on 02/09/2019.  Unable to place feeding tube and patient is at high risk for acute malnutrition on ECMO; therefore, Pharmacy consulted to manage TPN.  Glucose / Insulin: A1c 6.3% - CBGs 141-196 on insulin gtt at 3.2 ml/hr Electrolytes: K 3.7 (4 runs ordered), low iCa (2 g 12/12), others WNL Renal: AKI resolving - SCr up 1.62, BUN 52 LFTs / TGs: LFTs WNL except AST at 68, tbili 1.6, TG 224 (ILE 20% of kCal) Prealbumin / albumin: albumin 2.2 Intake / Output; MIVF: NG output 650 ml, UOP 1.4 ml/kg/hr on Lasix gtt, net +11.5L GI Imaging: N/A Surgeries / Procedures: N/A  Central access: 01/26/19 TPN start date: 01/29/19  Nutritional Goals (per RD rec on 12/11): 2100 kCal and 155-185 gm protein per day  Current Nutrition:  NPO  Plan:  Start concentrated TPN at 40 mL/hr at 1800 (goal rate 80 ml/hr).  Will initiate lipid given high risk for malnutrition and would help with reducing CHO in TPN. TPN will provide 87g AA, 144g CHO and 21g ILE for a total of 1050 kCal, meeting ~50% of patient needs. Electrolytes in TPN: standard lytes except increased Ca, Cl:Ac 1:1 Daily trace elements in TPN.  Multivitamin MWF only d/t shortage. Continue insulin infusion per MD - RN aware to ?recalibrate when TPN starts tonight Standard TPN labs and nursing care orders  Thank you for involving pharmacy in this patient's care.  Renold Genta, PharmD, BCPS Clinical Pharmacist Clinical phone for 01/29/2019  until 3p is [phone] 01/29/2019 8:01 AM  **Pharmacist phone directory can be found on [email or web address] listed under [place]**

## 2019-01-30 ENCOUNTER — Encounter (HOSPITAL_COMMUNITY): Admission: EM | Disposition: E | Payer: Self-pay | Source: Home / Self Care | Attending: Internal Medicine

## 2019-01-30 ENCOUNTER — Inpatient Hospital Stay (HOSPITAL_COMMUNITY): Payer: BC Managed Care – PPO

## 2019-01-30 DIAGNOSIS — Z7189 Other specified counseling: Secondary | ICD-10-CM

## 2019-01-30 DIAGNOSIS — I2789 Other specified pulmonary heart diseases: Secondary | ICD-10-CM

## 2019-01-30 DIAGNOSIS — I2699 Other pulmonary embolism without acute cor pulmonale: Secondary | ICD-10-CM

## 2019-01-30 DIAGNOSIS — I2602 Saddle embolus of pulmonary artery with acute cor pulmonale: Secondary | ICD-10-CM

## 2019-01-30 LAB — BPAM RBC
Blood Product Expiration Date: 202012312359
Blood Product Expiration Date: 202101022359
Blood Product Expiration Date: 202101052359
Blood Product Expiration Date: 202101052359
Blood Product Expiration Date: 202101052359
Blood Product Expiration Date: 202101132359
Blood Product Expiration Date: 202101132359
Blood Product Expiration Date: 202101132359
Blood Product Expiration Date: 202101132359
Blood Product Expiration Date: 202101142359
Blood Product Expiration Date: 202101152359
Blood Product Expiration Date: 202101152359
Blood Product Expiration Date: 202101152359
Blood Product Expiration Date: 202101152359
Blood Product Expiration Date: 202101152359
ISSUE DATE / TIME: 202012110947
ISSUE DATE / TIME: 202012111623
ISSUE DATE / TIME: 202012112218
ISSUE DATE / TIME: 202012120430
ISSUE DATE / TIME: 202012121253
ISSUE DATE / TIME: 202012121253
ISSUE DATE / TIME: 202012121717
ISSUE DATE / TIME: 202012121717
ISSUE DATE / TIME: 202012121717
ISSUE DATE / TIME: 202012122157
ISSUE DATE / TIME: 202012131051
ISSUE DATE / TIME: 202012132317
Unit Type and Rh: 6200
Unit Type and Rh: 6200
Unit Type and Rh: 6200
Unit Type and Rh: 6200
Unit Type and Rh: 6200
Unit Type and Rh: 6200
Unit Type and Rh: 6200
Unit Type and Rh: 6200
Unit Type and Rh: 6200
Unit Type and Rh: 6200
Unit Type and Rh: 6200
Unit Type and Rh: 6200
Unit Type and Rh: 6200
Unit Type and Rh: 6200
Unit Type and Rh: 6200

## 2019-01-30 LAB — TYPE AND SCREEN
ABO/RH(D): A POS
Antibody Screen: NEGATIVE
Unit division: 0
Unit division: 0
Unit division: 0
Unit division: 0
Unit division: 0
Unit division: 0
Unit division: 0
Unit division: 0
Unit division: 0
Unit division: 0
Unit division: 0
Unit division: 0
Unit division: 0
Unit division: 0
Unit division: 0

## 2019-01-30 LAB — COOXEMETRY PANEL
Carboxyhemoglobin: 2 % — ABNORMAL HIGH (ref 0.5–1.5)
Methemoglobin: 1.7 % — ABNORMAL HIGH (ref 0.0–1.5)
O2 Saturation: 69.2 %
Total hemoglobin: 10.1 g/dL — ABNORMAL LOW (ref 12.0–16.0)

## 2019-01-30 LAB — CBC
HCT: 23.9 % — ABNORMAL LOW (ref 39.0–52.0)
HCT: 24.4 % — ABNORMAL LOW (ref 39.0–52.0)
HCT: 23.6 % — ABNORMAL LOW (ref 39.0–52.0)
HCT: 25.2 % — ABNORMAL LOW (ref 39.0–52.0)
HCT: 24.7 % — ABNORMAL LOW (ref 39.0–52.0)
Hemoglobin: 8 g/dL — ABNORMAL LOW (ref 13.0–17.0)
Hemoglobin: 8.3 g/dL — ABNORMAL LOW (ref 13.0–17.0)
Hemoglobin: 7.8 g/dL — ABNORMAL LOW (ref 13.0–17.0)
Hemoglobin: 8.4 g/dL — ABNORMAL LOW (ref 13.0–17.0)
Hemoglobin: 8.4 g/dL — ABNORMAL LOW (ref 13.0–17.0)
MCH: 31.1 pg (ref 26.0–34.0)
MCH: 31.3 pg (ref 26.0–34.0)
MCH: 31.2 pg (ref 26.0–34.0)
MCH: 31.3 pg (ref 26.0–34.0)
MCH: 31.7 pg (ref 26.0–34.0)
MCHC: 33.5 g/dL (ref 30.0–36.0)
MCHC: 34 g/dL (ref 30.0–36.0)
MCHC: 33.1 g/dL (ref 30.0–36.0)
MCHC: 33.3 g/dL (ref 30.0–36.0)
MCHC: 34 g/dL (ref 30.0–36.0)
MCV: 93 fL (ref 80.0–100.0)
MCV: 92.1 fL (ref 80.0–100.0)
MCV: 94.4 fL (ref 80.0–100.0)
MCV: 94 fL (ref 80.0–100.0)
MCV: 93.2 fL (ref 80.0–100.0)
Platelets: 67 10*3/uL — ABNORMAL LOW (ref 150–400)
Platelets: 72 10*3/uL — ABNORMAL LOW (ref 150–400)
Platelets: 71 10*3/uL — ABNORMAL LOW (ref 150–400)
Platelets: 80 10*3/uL — ABNORMAL LOW (ref 150–400)
Platelets: 81 10*3/uL — ABNORMAL LOW (ref 150–400)
RBC: 2.57 MIL/uL — ABNORMAL LOW (ref 4.22–5.81)
RBC: 2.65 MIL/uL — ABNORMAL LOW (ref 4.22–5.81)
RBC: 2.5 MIL/uL — ABNORMAL LOW (ref 4.22–5.81)
RBC: 2.68 MIL/uL — ABNORMAL LOW (ref 4.22–5.81)
RBC: 2.65 MIL/uL — ABNORMAL LOW (ref 4.22–5.81)
RDW: 14.7 % (ref 11.5–15.5)
RDW: 14.8 % (ref 11.5–15.5)
RDW: 15.8 % — ABNORMAL HIGH (ref 11.5–15.5)
RDW: 15.3 % (ref 11.5–15.5)
RDW: 16 % — ABNORMAL HIGH (ref 11.5–15.5)
WBC: 24.5 10*3/uL — ABNORMAL HIGH (ref 4.0–10.5)
WBC: 24.3 10*3/uL — ABNORMAL HIGH (ref 4.0–10.5)
WBC: 24.4 10*3/uL — ABNORMAL HIGH (ref 4.0–10.5)
WBC: 24.1 10*3/uL — ABNORMAL HIGH (ref 4.0–10.5)
WBC: 24.4 10*3/uL — ABNORMAL HIGH (ref 4.0–10.5)
nRBC: 13.3 % — ABNORMAL HIGH (ref 0.0–0.2)
nRBC: 14.1 % — ABNORMAL HIGH (ref 0.0–0.2)
nRBC: 11.1 % — ABNORMAL HIGH (ref 0.0–0.2)
nRBC: 11.8 % — ABNORMAL HIGH (ref 0.0–0.2)
nRBC: 11.5 % — ABNORMAL HIGH (ref 0.0–0.2)

## 2019-01-30 LAB — POCT I-STAT 7, (LYTES, BLD GAS, ICA,H+H)
Acid-Base Excess: 7 mmol/L — ABNORMAL HIGH (ref 0.0–2.0)
Acid-Base Excess: 5 mmol/L — ABNORMAL HIGH (ref 0.0–2.0)
Acid-Base Excess: 4 mmol/L — ABNORMAL HIGH (ref 0.0–2.0)
Bicarbonate: 31.4 mmol/L — ABNORMAL HIGH (ref 20.0–28.0)
Bicarbonate: 30.1 mmol/L — ABNORMAL HIGH (ref 20.0–28.0)
Bicarbonate: 28.5 mmol/L — ABNORMAL HIGH (ref 20.0–28.0)
Calcium, Ion: 1.02 mmol/L — ABNORMAL LOW (ref 1.15–1.40)
Calcium, Ion: 1.03 mmol/L — ABNORMAL LOW (ref 1.15–1.40)
Calcium, Ion: 1.04 mmol/L — ABNORMAL LOW (ref 1.15–1.40)
HCT: 23 % — ABNORMAL LOW (ref 39.0–52.0)
HCT: 25 % — ABNORMAL LOW (ref 39.0–52.0)
HCT: 22 % — ABNORMAL LOW (ref 39.0–52.0)
Hemoglobin: 7.8 g/dL — ABNORMAL LOW (ref 13.0–17.0)
Hemoglobin: 8.5 g/dL — ABNORMAL LOW (ref 13.0–17.0)
Hemoglobin: 7.5 g/dL — ABNORMAL LOW (ref 13.0–17.0)
O2 Saturation: 100 %
O2 Saturation: 92 %
O2 Saturation: 93 %
Patient temperature: 36.5
Patient temperature: 36.8
Patient temperature: 36.9
Potassium: 4.2 mmol/L (ref 3.5–5.1)
Potassium: 4.4 mmol/L (ref 3.5–5.1)
Potassium: 4.3 mmol/L (ref 3.5–5.1)
Sodium: 143 mmol/L (ref 135–145)
Sodium: 145 mmol/L (ref 135–145)
Sodium: 144 mmol/L (ref 135–145)
TCO2: 33 mmol/L — ABNORMAL HIGH (ref 22–32)
TCO2: 32 mmol/L (ref 22–32)
TCO2: 30 mmol/L (ref 22–32)
pCO2 arterial: 43.4 mmHg (ref 32.0–48.0)
pCO2 arterial: 45.8 mmHg (ref 32.0–48.0)
pCO2 arterial: 41.6 mmHg (ref 32.0–48.0)
pH, Arterial: 7.465 — ABNORMAL HIGH (ref 7.350–7.450)
pH, Arterial: 7.425 (ref 7.350–7.450)
pH, Arterial: 7.444 (ref 7.350–7.450)
pO2, Arterial: 220 mmHg — ABNORMAL HIGH (ref 83.0–108.0)
pO2, Arterial: 63 mmHg — ABNORMAL LOW (ref 83.0–108.0)
pO2, Arterial: 64 mmHg — ABNORMAL LOW (ref 83.0–108.0)

## 2019-01-30 LAB — COMPREHENSIVE METABOLIC PANEL
ALT: 36 U/L (ref 0–44)
ALT: 40 U/L (ref 0–44)
AST: 88 U/L — ABNORMAL HIGH (ref 15–41)
AST: 95 U/L — ABNORMAL HIGH (ref 15–41)
Albumin: 1.9 g/dL — ABNORMAL LOW (ref 3.5–5.0)
Albumin: 2 g/dL — ABNORMAL LOW (ref 3.5–5.0)
Alkaline Phosphatase: 58 U/L (ref 38–126)
Alkaline Phosphatase: 67 U/L (ref 38–126)
Anion gap: 10 (ref 5–15)
Anion gap: 10 (ref 5–15)
BUN: 60 mg/dL — ABNORMAL HIGH (ref 8–23)
BUN: 65 mg/dL — ABNORMAL HIGH (ref 8–23)
CO2: 26 mmol/L (ref 22–32)
CO2: 28 mmol/L (ref 22–32)
Calcium: 7 mg/dL — ABNORMAL LOW (ref 8.9–10.3)
Calcium: 7.2 mg/dL — ABNORMAL LOW (ref 8.9–10.3)
Chloride: 107 mmol/L (ref 98–111)
Chloride: 107 mmol/L (ref 98–111)
Creatinine, Ser: 1.84 mg/dL — ABNORMAL HIGH (ref 0.61–1.24)
Creatinine, Ser: 2.06 mg/dL — ABNORMAL HIGH (ref 0.61–1.24)
GFR calc Af Amer: 43 mL/min — ABNORMAL LOW (ref >60)
GFR calc Af Amer: 38 mL/min — ABNORMAL LOW (ref >60)
GFR calc non Af Amer: 37 mL/min — ABNORMAL LOW (ref >60)
GFR calc non Af Amer: 32 mL/min — ABNORMAL LOW (ref >60)
Glucose, Bld: 185 mg/dL — ABNORMAL HIGH (ref 70–99)
Glucose, Bld: 184 mg/dL — ABNORMAL HIGH (ref 70–99)
Potassium: 4.2 mmol/L (ref 3.5–5.1)
Potassium: 4.4 mmol/L (ref 3.5–5.1)
Sodium: 143 mmol/L (ref 135–145)
Sodium: 145 mmol/L (ref 135–145)
Total Bilirubin: 2 mg/dL — ABNORMAL HIGH (ref 0.3–1.2)
Total Bilirubin: 2 mg/dL — ABNORMAL HIGH (ref 0.3–1.2)
Total Protein: 4.1 g/dL — ABNORMAL LOW (ref 6.5–8.1)
Total Protein: 4.3 g/dL — ABNORMAL LOW (ref 6.5–8.1)

## 2019-01-30 LAB — BASIC METABOLIC PANEL
Anion gap: 11 (ref 5–15)
BUN: 64 mg/dL — ABNORMAL HIGH (ref 8–23)
CO2: 27 mmol/L (ref 22–32)
Calcium: 7.1 mg/dL — ABNORMAL LOW (ref 8.9–10.3)
Chloride: 107 mmol/L (ref 98–111)
Creatinine, Ser: 2.11 mg/dL — ABNORMAL HIGH (ref 0.61–1.24)
GFR calc Af Amer: 36 mL/min — ABNORMAL LOW (ref >60)
GFR calc non Af Amer: 31 mL/min — ABNORMAL LOW (ref >60)
Glucose, Bld: 162 mg/dL — ABNORMAL HIGH (ref 70–99)
Potassium: 4.3 mmol/L (ref 3.5–5.1)
Sodium: 145 mmol/L (ref 135–145)

## 2019-01-30 LAB — APTT: aPTT: 90 seconds — ABNORMAL HIGH (ref 24–36)

## 2019-01-30 LAB — PROTIME-INR
INR: 1.4 — ABNORMAL HIGH (ref 0.8–1.2)
INR: 1.3 — ABNORMAL HIGH (ref 0.8–1.2)
Prothrombin Time: 16.9 seconds — ABNORMAL HIGH (ref 11.4–15.2)
Prothrombin Time: 16.5 seconds — ABNORMAL HIGH (ref 11.4–15.2)

## 2019-01-30 LAB — HEPARIN LEVEL (UNFRACTIONATED)
Heparin Unfractionated: 0.39 IU/mL (ref 0.30–0.70)
Heparin Unfractionated: 0.41 IU/mL (ref 0.30–0.70)

## 2019-01-30 LAB — DIFFERENTIAL
Abs Immature Granulocytes: 3.12 10*3/uL — ABNORMAL HIGH (ref 0.00–0.07)
Basophils Absolute: 0.1 10*3/uL (ref 0.0–0.1)
Basophils Relative: 0 %
Eosinophils Absolute: 0.2 10*3/uL (ref 0.0–0.5)
Eosinophils Relative: 1 %
Immature Granulocytes: 13 %
Lymphocytes Relative: 8 %
Lymphs Abs: 1.9 10*3/uL (ref 0.7–4.0)
Monocytes Absolute: 2.3 10*3/uL — ABNORMAL HIGH (ref 0.1–1.0)
Monocytes Relative: 10 %
Neutro Abs: 16.7 10*3/uL — ABNORMAL HIGH (ref 1.7–7.7)
Neutrophils Relative %: 68 %

## 2019-01-30 LAB — LACTIC ACID, PLASMA: Lactic Acid, Venous: 1.5 mmol/L (ref 0.5–1.9)

## 2019-01-30 LAB — HAPTOGLOBIN: Haptoglobin: 14 mg/dL — ABNORMAL LOW (ref 32–363)

## 2019-01-30 LAB — GLUCOSE, CAPILLARY
Glucose-Capillary: 123 mg/dL — ABNORMAL HIGH (ref 70–99)
Glucose-Capillary: 133 mg/dL — ABNORMAL HIGH (ref 70–99)
Glucose-Capillary: 154 mg/dL — ABNORMAL HIGH (ref 70–99)
Glucose-Capillary: 111 mg/dL — ABNORMAL HIGH (ref 70–99)

## 2019-01-30 LAB — TRIGLYCERIDES: Triglycerides: 208 mg/dL — ABNORMAL HIGH (ref <150)

## 2019-01-30 LAB — PREPARE RBC (CROSSMATCH)

## 2019-01-30 LAB — LACTATE DEHYDROGENASE
LDH: 450 U/L — ABNORMAL HIGH (ref 98–192)
LDH: 446 U/L — ABNORMAL HIGH (ref 98–192)

## 2019-01-30 LAB — MAGNESIUM: Magnesium: 2.4 mg/dL (ref 1.7–2.4)

## 2019-01-30 LAB — PREALBUMIN: Prealbumin: 7.5 mg/dL — ABNORMAL LOW (ref 18–38)

## 2019-01-30 LAB — PHOSPHORUS: Phosphorus: 4.5 mg/dL (ref 2.5–4.6)

## 2019-01-30 SURGERY — CANNULATION FOR ECMO (EXTRACORPOREAL MEMBRANE OXYGENATION)
Anesthesia: General

## 2019-01-30 MED ORDER — SILDENAFIL CITRATE 20 MG PO TABS
20.0000 mg | ORAL_TABLET | Freq: Three times a day (TID) | ORAL | Status: DC
  Administered 2019-01-30 – 2019-01-31 (×4): 20 mg via ORAL
  Filled 2019-01-30 (×4): qty 1

## 2019-01-30 MED ORDER — ALBUMIN HUMAN 25 % IV SOLN
12.5000 g | Freq: Once | INTRAVENOUS | Status: AC
  Administered 2019-01-30: 12.5 g via INTRAVENOUS
  Filled 2019-01-30: qty 50

## 2019-01-30 MED ORDER — SODIUM CHLORIDE 0.9% IV SOLUTION: Freq: Once | INTRAVENOUS | Status: AC

## 2019-01-30 MED ORDER — ALBUMIN HUMAN 5 % IV SOLN
INTRAVENOUS | Status: AC
  Filled 2019-01-30: qty 250

## 2019-01-30 MED ORDER — VITAL 1.5 CAL PO LIQD
1000.0000 mL | ORAL | Status: DC
  Administered 2019-01-30 – 2019-01-31 (×2): 1000 mL
  Filled 2019-01-30 (×2): qty 1000

## 2019-01-30 MED ORDER — ALBUMIN HUMAN 25 % IV SOLN: 25.0000 g | Freq: Once | INTRAVENOUS | Status: DC

## 2019-01-30 MED ORDER — SODIUM CHLORIDE 0.9% IV SOLUTION: Freq: Once | INTRAVENOUS | Status: DC

## 2019-01-30 MED ORDER — IOHEXOL 350 MG/ML SOLN
80.0000 mL | Freq: Once | INTRAVENOUS | Status: AC | PRN
  Administered 2019-01-30: 43 mL via INTRAVENOUS

## 2019-01-30 MED ORDER — SODIUM CHLORIDE 0.9 % IV SOLN: INTRAVENOUS | Status: DC

## 2019-01-30 NOTE — Plan of Care (Signed)
  Problem: Education: Goal: Knowledge of General Education information will improve Description: Including pain rating scale, medication(s)/side effects and non-pharmacologic comfort measures Outcome: Not Progressing   Problem: Health Behavior/Discharge Planning: Goal: Ability to manage health-related needs will improve Outcome: Not Progressing   Problem: Clinical Measurements: Goal: Will remain free from infection Outcome: Progressing Goal: Diagnostic test results will improve Outcome: Progressing Goal: Respiratory complications will improve Outcome: Progressing Goal: Cardiovascular complication will be avoided Outcome: Progressing   

## 2019-01-30 NOTE — Progress Notes (Signed)
Dr. Cyndia Bent notified about increase to pressor support with a low flow of 2.5 on the Cardiohelp.  MD agreed that going up on the flows on Cardiohelp would be helpful since the plan for decannulation has been moved to Wednesday.  Patient is currently flowing at 4.4 lpm on cardiohelp.

## 2019-01-30 NOTE — Progress Notes (Addendum)
ANTICOAGULATION CONSULT NOTE   Pharmacy Consult for Heparin  Indication: pulmonary embolus + ECMO  No Known Allergies  Patient Measurements: Height: 5\' 9"  (175.3 cm) Weight: (!) 337 lb 11.9 oz (153.2 kg) IBW/kg (Calculated) : 70.7 Heparin Dosing Weight: 103 kg  Vital Signs: Temp: 98.2 F (36.8 C) (12/14 1800) Temp Source: Bladder (12/14 1600) BP: 89/57 (12/14 1516) Pulse Rate: 97 (12/14 1800)  Labs: Recent Labs    01/29/19 0431 01/29/19 0500 01/29/19 0750 01/29/19 1549 01/29/19 1556 02/02/2019 0455 01/26/2019 1100 01/26/2019 1417 01/29/2019 1607 02/09/2019 1626  HGB 8.1*   < >  --  8.4*  --  8.3* 7.8*  --  8.4* 8.5*  HCT 23.4*   < >  --  24.3*  --  24.4* 23.6*  --  25.2* 25.0*  PLT 60*  --  60* 63*   < > 72* 71*  --  80*  --   APTT 78*  --  78*  --   --  90*  --   --   --   --   LABPROT 16.6*  --  16.4*  --   --  16.9*  --   --   --   --   INR 1.4*  --  1.3*  --   --  1.4*  --   --   --   --   HEPARINUNFRC  --   --   --  0.26*  --  0.39  --   --  0.41  --   CREATININE 1.62*  --   --  1.76*   < > 1.84*  --  2.11* 2.06*  --    < > = values in this interval not displayed.    Estimated Creatinine Clearance: 51 mL/min (A) (by C-G formula based on SCr of 2.06 mg/dL (H)).  Assessment: 68 yr old male admitted with massive PE and RV failure. Pt received 150 mg total of systemic tPA with continued shock, so ECMO started 12/8. EKOS started along with IV heparin, then repeated 12/10 with significant clot burden. RV function improving on ECHO 12/11 - holding off further tPA/thrombectomy.  Heparin level remains therapeutic at 0.41 units/mL. TEE done this afternoon; EF 65%. Hgb stable 8.5, plt also low, but stable at 80. Per RN, no issues with IV or bleeding observed.  Goal of Therapy:  Heparin level 0.3-0.5 units/mL per MD (while on ECMO) Monitor platelets by anticoagulation protocol: Yes    Plan:  -Continue heparin 1450 units/hr -Check heparin level (0500/1700) -Monitor for  bleeding  Gillermina Hu, PharmD, BCPS, [facility] Clinical Pharmacist 01/28/2019 6:05 PM

## 2019-01-30 NOTE — Progress Notes (Signed)
6 Days Post-Op Procedure(s) (LRB): CANNULATION FOR VA ECMO (EXTRACORPOREAL MEMBRANE OXYGENATION) (N/A) TRANSESOPHAGEAL ECHOCARDIOGRAM (TEE) (N/A) Subjective: Patient stable on ECMO day 6 CTA shows some improvement in bilat PE but R distal main PA with sig residual occlusion- prob chronic TEE pending  Objective: Vital signs in last 24 hours: Temp:  [96.6 F (35.9 C)-98.1 F (36.7 C)] 97.7 F (36.5 C) (12/14 1302) Pulse Rate:  [78-108] 87 (12/14 1302) Cardiac Rhythm: Normal sinus rhythm;Sinus tachycardia (12/14 1200) Resp:  [0-25] 0 (12/14 1302) BP: (85-99)/(60-67) 85/60 (12/13 2345) SpO2:  [98 %-100 %] 99 % (12/14 1302) Arterial Line BP: (85-123)/(55-77) 106/59 (12/14 1302) FiO2 (%):  [30 %] 30 % (12/14 1232) Weight:  [153.2 kg] 153.2 kg (12/14 0500)  Hemodynamic parameters for last 24 hours: CVP:  [7 mmHg-11 mmHg] 8 mmHg  Intake/Output from previous day: 12/13 0701 - 12/14 0700 In: 3823.2 [I.V.:2091.3; Blood:630; NG/GT:591.7; IV Piggyback:510.2] Out: 0175 [Urine:3560] Intake/Output this shift: Total I/O In: 778.7 [I.V.:464.7; Blood:30; NG/GT:184; IV Piggyback:100] Out: 625 [Urine:625]       Exam    General- sedated    Neck- no JVD, no cervical adenopathy palpable, no carotid bruit   Lungs- clear without rales, wheezes   Cor- regular rate and rhythm, no murmur , gallop   Abdomen- soft, non-tender   Extremities - warm, non-tender, minimal edema   Neuro- oriented, appropriate, no focal weakness  ECMO cannulation sites clean  Lab Results: Recent Labs    02/13/2019 0455 01/25/2019 0510 01/17/2019 1100  WBC 24.3*  --  24.4*  HGB 8.3* 7.8* 7.8*  HCT 24.4* 23.0* 23.6*  PLT 72*  --  71*   BMET:  Recent Labs    01/29/19 2212 01/25/2019 0455 01/22/2019 0510  NA 144 143 143  K 4.3 4.2 4.2  CL 108 107  --   CO2 27 26  --   GLUCOSE 189* 185*  --   BUN 54* 60*  --   CREATININE 1.75* 1.84*  --   CALCIUM 6.9* 7.0*  --     PT/INR:  Recent Labs    01/19/2019 0455   LABPROT 16.9*  INR 1.4*   ABG    Component Value Date/Time   PHART 7.465 (H) 01/20/2019 0510   HCO3 31.4 (H) 02/10/2019 0510   TCO2 33 (H) 02/15/2019 0510   ACIDBASEDEF 3.0 (H) 01/26/2019 0555   O2SAT 100.0 01/26/2019 0510   CBG (last 3)  Recent Labs    02/07/2019 0336 01/20/2019 0747 02/06/2019 1123  GLUCAP 123* 133* 154*    Assessment/Plan: S/P Procedure(s) (LRB): CANNULATION FOR VA ECMO (EXTRACORPOREAL MEMBRANE OXYGENATION) (N/A) TRANSESOPHAGEAL ECHOCARDIOGRAM (TEE) (N/A) If patient continues to demonstrate sig RV dysfunction with wean the best option prob would be formal pulmonary thromboembolectomy. However major cardiac surgery may not be acceptable to family wishes. Consider IR percutaneous thrombectomy on ECMO for possible improvement to increase chance of survival    LOS: 7 days    Philip Wolfe 02/02/2019

## 2019-01-30 NOTE — CV Procedure (Signed)
Procedure: TEE  Indication: RV failure  Sedation: Versed 4 mg IV, Fentanyl gtt  Findings: See echo section for full report. Difficult study.  Normal LV size with moderate LV hypertrophy, EF 65%.  Normal RV size with near-normal systolic function visually.  The RV was somewhat difficult to image.  Normal right and left atrial sizes.  LA appendage very small, no thrombus noted.  No significant tricuspid regurgitation or pulmonic insufficiency noted.  No significant mitral regurgitation.  Trileaflet, mildly calcified aortic valve with mild aortic insufficiency, no stenosis.  Trivial pericardial effusion.   Flow turned down as low as 1 L/min with minimal change to RV appearance.  The arterial waveform remained pulsatile and MAP was fairly stable.   Flow left at 2.5 L/min at the end of the procedure.   Philip Wolfe 02/05/2019 1:27 PM

## 2019-01-30 NOTE — H&P (View-Only) (Signed)
Patient ID: Philip Wolfe, male   DOB: 1950/12/08, 68 y.o.   MRN: [id number] ]    Advanced Heart Failure Rounding Note  PCP-Cardiologist: No primary care provider on file.   Subjective:    - Massive PE with cardiogenic shock: VA ECMO initiated 12/8. Patient then had PA catheter-directed tPA until 8 am 12/9.  Left leg DVT.  - 12/9 ENT Merocel packing for severe epistaxis.  - PA catheter-directed tPA 12/20 again.    Nasopharyngeal bleeding seems to have resolved after nasal packing by ENT.   Hgb 8.3, has had 1 unit PRBCs overnight, no obvious bleeding.  Plts 72K.   Slowly weaning pressors.  Now off epinephrine and vasopressin and on norepinephrine 28.  MAP 70s.  CVP 10-11 on Lasix gtt 8 mg/hr, net even today, creatinine 1.8.  Heparin gtt ongoing with therapeutic level. iNO at 15 ppm. Co-ox 69% this morning. LDH 370 => 412 => 450. Echo over weekend difficult windows but RV remains hypokinetic (not as bad as initially).   Flow decreased to 2 L/min transiently on ECMO this morning, MAP remained around 62 with increased pulsatility.   Tube feeds ongoing.   Empiric vancomycin/cefepime ongoing, afebrile, cultures remain negative.  WBCs high 39 => 32 => 29 => 24.  Sedated on Versed/Fentanyl. Will open eyes and respond to pain with sedation wean but has not had full wean due to vent dyssynchrony.   ECMO: Stable, no chugging.  Flow 4.5 L/min  3600 rpm Venous pressure -98 Delta P 28  Objective:   Weight Range: (!) 153.2 kg Body mass index is 49.88 kg/m.   Vital Signs:   Temp:  [97.5 F (36.4 C)-98.1 F (36.7 C)] 97.7 F (36.5 C) (12/14 0700) Pulse Rate:  [80-108] 84 (12/14 0700) Resp:  [0-25] 25 (12/14 0700) BP: (85-108)/(60-74) 85/60 (12/13 2345) SpO2:  [99 %-100 %] 99 % (12/14 0700) Arterial Line BP: (85-123)/(60-77) 100/67 (12/14 0700) FiO2 (%):  [30 %] 30 % (12/14 0457) Weight:  [153.2 kg] 153.2 kg (12/14 0500) Last BM Date: (pta)  Weight change: Filed Weights   01/28/19  0600 01/29/19 0630 02/10/2019 0500  Weight: (!) 153.4 kg (!) 152.7 kg (!) 153.2 kg    Intake/Output:   Intake/Output Summary (Last 24 hours) at 01/31/2019 0756 Last data filed at 02/04/2019 0700 Gross per 24 hour  Intake 3823.19 ml  Output 3560 ml  Net 263.19 ml      Physical Exam    General: Intubated/sedated Neck: Thick, JVP difficult, no thyromegaly or thyroid nodule.  Lungs: Decreased at bases.  CV: Nonpalpable PMI.  Heart regular S1/S2, no S3/S4, no murmur.  Anasarca.   Abdomen: Soft, nontender, no hepatosplenomegaly, no distention.  Skin: Intact without lesions or rashes.  Neurologic: sedated. Extremities: No clubbing or cyanosis.  HEENT: Normal.    Telemetry   NSR 80s-90s (personally reviewed)  Labs    CBC Recent Labs    02/10/2019 0214 02/07/2019 0455 02/04/2019 0510  WBC 24.5* 24.3*  --   NEUTROABS  --  16.7*  --   HGB 8.0* 8.3* 7.8*  HCT 23.9* 24.4* 23.0*  MCV 93.0 92.1  --   PLT 67* 72*  --    Basic Metabolic Panel Recent Labs    01/29/19 1549 01/29/19 2212 02/09/2019 0455 01/22/2019 0510  NA 144 144 143 143  K 4.1 4.3 4.2 4.2  CL 107 108 107  --   CO2 _0 --   GLUCOSE 170* 189* 185*  --  BUN 52* 54* 60*  --   CREATININE 1.76* 1.75* 1.84*  --   CALCIUM 7.0* 6.9* 7.0*  --   MG 2.2  --  2.4  --   PHOS 2.8  --  4.5  --    Liver Function Tests Recent Labs    01/29/19 1549 02/16/2019 0455  AST 85* 88*  ALT 34 36  ALKPHOS 60 58  BILITOT 2.1* 2.0*  PROT 4.0* 4.1*  ALBUMIN 2.0* 1.9*   No results for input(s): LIPASE, AMYLASE in the last 72 hours. Cardiac Enzymes No results for input(s): CKTOTAL, CKMB, CKMBINDEX, TROPONINI in the last 72 hours.  BNP: BNP (last 3 results) Recent Labs    01/20/2019 1505  BNP 57.6    ProBNP (last 3 results) No results for input(s): PROBNP in the last 8760 hours.   D-Dimer Recent Labs    01/29/19 0750  DDIMER 0.89*   Hemoglobin A1C No results for input(s): HGBA1C in the last 72 hours. Fasting  Lipid Panel Recent Labs    01/21/2019 0455  TRIG 208*   Thyroid Function Tests No results for input(s): TSH, T4TOTAL, T3FREE, THYROIDAB in the last 72 hours.  Invalid input(s): FREET3  Other results:   Imaging    No results found.   Medications:     Scheduled Medications: . sodium chloride   Intravenous Once  . bisacodyl  10 mg Oral Daily   Or  . bisacodyl  10 mg Rectal Daily  . chlorhexidine gluconate (MEDLINE KIT)  15 mL Mouth Rinse BID  . Chlorhexidine Gluconate Cloth  6 each Topical Daily  . docusate sodium  200 mg Oral Daily  . feeding supplement (PRO-STAT SUGAR FREE 64)  30 mL Per Tube TID  . insulin aspart  0-20 Units Subcutaneous Q4H  . insulin glargine  12 Units Subcutaneous Q24H  . mouth rinse  15 mL Mouth Rinse 10 times per day  . pantoprazole (PROTONIX) IV  40 mg Intravenous Q12H  . sodium chloride flush  10-40 mL Intracatheter Q12H  . sodium chloride flush  3 mL Intravenous Q12H    Infusions: . sodium chloride 10 mL/hr at 01/31/2019 0700  . ceFEPime (MAXIPIME) IV Stopped (01/22/2019 0253)  . dexmedetomidine (PRECEDEX) IV infusion Stopped (01/26/19 1143)  . epinephrine Stopped (01/28/19 0923)  . feeding supplement (VITAL 1.5 CAL) 1,000 mL (01/29/19 1125)  . fentaNYL infusion INTRAVENOUS 300 mcg/hr (01/20/2019 0700)  . furosemide (LASIX) infusion 8 mg/hr (01/22/2019 0700)  . heparin 1,450 Units/hr (01/29/2019 0700)  . insulin Stopped (01/29/19 1338)  . lactated ringers 10 mL/hr at 01/25/19 1031  . lactated ringers Stopped (02/16/2019 2159)  . midazolam 0.5 mg/hr (02/14/2019 0700)  . norepinephrine (LEVOPHED) Adult infusion 28 mcg/min (02/09/2019 0729)  . TPN ADULT (ION)    . vasopressin (PITRESSIN) infusion - *FOR SHOCK* Stopped (01/28/19 2245)    PRN Medications: sodium chloride, bisacodyl, calcium chloride, dextrose, fentaNYL, fentaNYL, metoprolol tartrate, midazolam, midazolam, morphine injection, ondansetron (ZOFRAN) IV, oxyCODONE, sodium chloride flush, sodium  chloride flush, traMADol   Assessment/Plan   1.  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  VA ECMO initiated 12/8.  Currently good flow (4-5 L/min range) at 3600 rpm.  Still with significant pressor requirement despite catheter-directed tPA x 2 now.  He remains on norepinephrine 28, off vasopressin and epinephrine.  Echo from weekend reviewed, RV still hypokinetic though with some improvement from the initial echo.  MAP 70s with CVP 10. Co-ox 69%.  Now on Lasix gtt at 8 mg/hr with even I/Os and CVP 10-11.  Creatinine mildly higher 1.8. Weaned flow transiently, he maintained BP reasonably well with pulsatility.  - Titrate down on norepinephrine today as tolerated, ok for MAP around 70.   - Continue iNO 15 ppm, would like him eventually on sildenafil.   - Repeat CTA today to see if we continue slow weaning of support or need to consider surgical thromboendarterectomy.  IR-directed thrombectomy may be option.   - Continue Lasix gtt 8 mg/hr and replace K.  - TEE today to visualized flow wean.  2. AKI: Creatinine mildly higher at 1.8, follow closely.  Will not try to aggressively diurese (increase Lasix) today given contrast with CTA.   3. Acute hypoxic/hypoxemic respiratory failure: FiO2 0.4, improved ABG on ECMO.  4. ID: WBCs elevated but afebrile.  No PNA on CXR.  PCT not elevated initially.  Blood and trach aspirate cultures so far negative.  - Empiric coverage with cefepime and vancomycin.  5. Pulmonary embolus: Massive bilateral PE with left leg DVT.  He got 150 mg IV total tPA initially, then catheter directed tPA to both PAs on 12/9 and again on 12/10.  - Continue to review with cardiac surgeons => as above, CTA chest today and will reassess need for attempt at surgical treatment. IR thrombectomy may be option as well.  - Continue heparin gtt, level therapeutic.  6. ENT: ENT bleeding, has had nasal packing by ENT, resolved.  7. Acute blood loss anemia:  ENT bleeding initially but this has slowed.  Not certain of current source of blood loss, no overt GI bleeding and LDH is stable.  Transfuse hgb < 8. He had 1 more unit PRBCs last night.   - Continue IV Protonix.  8. Thrombocytopenia: Plts 72 K this morning.  Suspect due to critical illness.  - Transfuse plts < 50K.  9. FEN: Continue TFs.   10. Neuro: Lighten sedation to see if we can wake up this morning.   CRITICAL CARE Performed by: Loralie Champagne  Total critical care time: 45 minutes  Critical care time was exclusive of separately billable procedures and treating other patients.  Critical care was necessary to treat or prevent imminent or life-threatening deterioration.  Critical care was time spent personally by me on the following activities: development of treatment plan with patient and/or surrogate as well as nursing, discussions with consultants, evaluation of patient's response to treatment, examination of patient, obtaining history from patient or surrogate, ordering and performing treatments and interventions, ordering and review of laboratory studies, ordering and review of radiographic studies, pulse oximetry and re-evaluation of patient's condition.   Length of Stay: 7  Loralie Champagne, MD  02/12/2019, 7:56 AM  Advanced Heart Failure Team Pager [phone] (M-F; [place])  Please contact [facility] for night-coverage after hours (4p -7a ) and weekends on [email or web address]

## 2019-01-30 NOTE — Progress Notes (Signed)
ANTICOAGULATION CONSULT NOTE   Pharmacy Consult for Heparin  Indication: pulmonary embolus + ECMO  No Known Allergies  Patient Measurements: Height: 5\' 9"  (175.3 cm) Weight: (!) 337 lb 11.9 oz (153.2 kg) IBW/kg (Calculated) : 70.7 Heparin Dosing Weight: 103 kg  Vital Signs: Temp: 97.5 F (36.4 C) (12/14 0800) Temp Source: Bladder (12/14 0800) BP: 85/60 (12/13 2345) Pulse Rate: 83 (12/14 0833)  Labs: Recent Labs    01/28/19 2004 01/29/19 0431 01/29/19 0431 01/29/19 0500 01/29/19 0750 01/29/19 1549 01/29/19 2212 02/07/2019 0214 02/07/2019 0455 01/19/2019 0510  HGB  --  8.1*   < >  --   --  8.4* 7.9* 8.0* 8.3* 7.8*  HCT  --  23.4*   < >  --   --  24.3* 23.1* 23.9* 24.4* 23.0*  PLT   < > 60*  --   --  60* 63* 71* 67* 72*  --   APTT  --  78*  --   --  78*  --   --   --  90*  --   LABPROT  --  16.6*  --   --  16.4*  --   --   --  16.9*  --   INR  --  1.4*  --   --  1.3*  --   --   --  1.4*  --   HEPARINUNFRC  --   --   --  0.29*  --  0.26*  --   --  0.39  --   CREATININE   < > 1.62*  --   --   --  1.76* 1.75*  --  1.84*  --    < > = values in this interval not displayed.    Estimated Creatinine Clearance: 57.1 mL/min (A) (by C-G formula based on SCr of 1.84 mg/dL (H)).  Assessment: 49 yoM admitted with massive PE and RV failure. Pt received 150mg  total of systemic tPA with continued shock so ECMO started 12/8. EKOS started along with IV heparin, then repeated 12/10 with significant clot burden. RV function improving on ECHO 12/11 - holding off further tPA/thrombectomy  Heparin level remains therapeutic at 0.39 units/mL. TEE done this afternoon. Hgb stable 7.8, plt also low but stable at 71.   Goal of Therapy:  Heparin level 0.3-0.5 units/mL per MD (while on ECMO) Monitor platelets by anticoagulation protocol: Yes    Plan:  -Continue heparin 1400 units/hr -Check heparin level (0500/1700) -Monitor for bleeding  Erin Hearing PharmD., BCPS Clinical Pharmacist 01/26/2019  1:28 PM

## 2019-01-30 NOTE — Progress Notes (Signed)
  Echocardiogram Echocardiogram Transesophageal has been performed.  Matilde Bash 02/02/2019, 1:27 PM

## 2019-01-30 NOTE — Progress Notes (Signed)
Called and updated wife, she will be in this afternoon, she will have phone if consent needed for TEE.  Erskine Emery MD PCCM

## 2019-01-30 NOTE — Progress Notes (Signed)
NAME:  Philip Wolfe, MRN:  [id number], DOB:  [birth year], LOS: 7 ADMISSION DATE:  02/06/2019, CONSULTATION DATE:  01/22/2019 REFERRING MD:  Dr. Ralene Bathe EDP, CHIEF COMPLAINT:  Hypoxia   Brief History   68 year old male admitted 12/7 with massive PE s/p thrombolytics and catheter directed lysis.  Has cardiac arrest, significant RV failure requiring initiation of ECMO  Past Medical History   has a past medical history of Arthritis, Hypertension, Sleep apnea, Ulcer (left medial ankle), and Venous stasis ulcer ([place]).   Significant Hospital Events   12/7-Admit, intubated coded, given TPA 800 mg and then 50 mg 6 hours later 12/8- Cannulated for VA ECMO 01/27/2019 and underwent EKOS directed catheter placement.  Inhaled NO started 12/9- Significant epistaxis requiring 3 units PRBC and nasal packing by ENT, bronchoscopy with no evidence of pulmonary bleed 12/10- Second round of EKOS directed catheter TPA, nasal packing replaced by [facility] catheter by ENT 12/11-reviewed echo with cardiology.  RV function is improving.  Held off further lytics/thrombectomy 12/13-requiring daily blood transfusions.  No evidence of overt bleeding.  Pressors weaning down  Consults:  PCCM, cardiology, cardiothoracic surgery  Procedures:  R axillary arterial catheter, R femoral venous catheter, RIJ introducer, LIJ MML, L radial artery  Significant Diagnostic Tests:  CTA 12/7-massive bilateral central pulmonary emboli with significant decrease in pulmonary artery flow, patchy groundglass infiltrate, rib fractures on the right.  CT head 12/7-generalized atrophy, chronic ischemic microangiopathy.  Echo 12/9-severe reduction in RV systolic function with RV volume and pressure overload.  LVEF 50-55%  Micro Data:  Blood cultures 12/7-negative Blood culture 12/8-negative MRSA PCR 12/8-negative Respiratory culture 12/10-negative  Antimicrobials:  Cefepime 12/10 > Vanco 12/9 >> 12/13  Interim history/subjective:  Pressor  requirements improving. Vent issues improved with ET exchange yesterday. Flows and pressures stable on ECMO.  Objective   Blood pressure (!) 85/60, pulse 83, temperature (!) 97.5 F (36.4 C), temperature source Bladder, resp. rate 18, height 5\' 9"  (1.753 m), weight (!) 153.2 kg, SpO2 100 %. CVP:  [5 mmHg-11 mmHg] 10 mmHg  Vent Mode: PCV FiO2 (%):  [30 %] 30 % Set Rate:  [15 bmp] 15 bmp PEEP:  [5 cmH20] 5 cmH20 Plateau Pressure:  [12 cmH20-20 cmH20] 14 cmH20   Intake/Output Summary (Last 24 hours) at 2019/02/23 0850 Last data filed at 2019/02/23 0800 Gross per 24 hour  Intake 3828.83 ml  Output 3410 ml  Net 418.83 ml   Filed Weights   01/28/19 0600 01/29/19 0630 02-23-2019 0500  Weight: (!) 153.4 kg (!) 152.7 kg (!) 153.2 kg   Examination: GEN: overweight ill appearing man in NAD HEENT: ETT in place, minimal bloody secretions CV: diminished, ext warm PULM: Diminished at bases, otherwise clear, triggering vent GI: Soft, hypoactive BS EXT: Marked diffuse anasarca NEURO: Withdraws to pain per nursing, full SAT unable to do as he becomes dyssynchronous and ECMO flows become unstable PSYCH: heavily sedated SKIN: pale, no rashes   Resolved Hospital Problem list     Assessment & Plan:  # Acute cor pulmonale due to massive PE- S/p TPA x 2 and EKOS X 2, on heparin drip and VA ECMO # Acute hypoxic, hypercarbic respiratory failure- in setting of above, on lung protective tidal volumes with gas exchange handled primarily through sweep changes; mechanics much better today after ETT exchanged for 8.5 and therapeutic bronch by Dr. Wonda Amis # Ongoing transfusion-dependent anemia- LDH stable but low grade hemolysis inevitable, had significant nasopharyngeal bleeding but this now under control, no other  clear signs of bleeding other than scattered bruising # Leukocytosis- stable, reactive, no obvious infection # Thrombocytopenia- reactive and some consumption due to ECMO inevitable, looks okay  today # Epistaxis- packed by ENT with good response, appreciate help Continue empiric antibiotic for leukocytosis.  Will DC Vanco as kidney function is worsening. Continue cefepime for 7 days Transfuse PRBC as needed.  Check DIC, hemolysis panel # Hyperglyemia- on insulin drip # Diffuse anasarca- related to reactive hypo-osmolar state, immobility, and fluid administration, probable mild volume overloaded state of heart but need to keep kidneys in mind # Acute kidney injury- combination cardiorenal syndrome and ATN, attempting diuresis with fair results   Plan today to get a better idea of how interventions to date have done. - Getting CTA chest to evaluate clot burden; if everything left looks more chronic, would advocate for getting him on systemic vasodilators to offload RV (IV flolan to start).  Probably too sick for surgical thrombectomy.  Intravascular removal by IR with new device is an option but would require multidisciplinary discussion - While down there to limit trips, would get CT head to make sure no bleeding/strokes with all interventions to date as well as CT A/P looking for any large hematomas (tough to figure out clinically given body habitus) - A transesophageal echo with reducing flows on pump would also be ideal to evaluate degree of remaining RV dysfunction so we can work towards weaning at some point - Otherwise continue usual AC, transfusions, pressor wean while on pump - Check afternoon BMP, if BUN/Cr rising, going to stop lasix - Cefepime x 7 days fine but no objective infection evidence - Will update wife after studies above completed  40 minutes critical care time Myrla Halsted MD PCCM

## 2019-01-30 NOTE — Progress Notes (Addendum)
ECMO Specialist goal of care for the shift is to maintain and optimize oxygenation and perfusion via utilization of ECMO therapy using appropriate settings to meet patient systemic and myocardial oxygen demands while monitoring patient clinical presentation and all hemodynamic parameters.   Caroll Weinheimer L. Tamala Julian, BS, RRT, RCP

## 2019-01-30 NOTE — Progress Notes (Signed)
Speed study preformed in room with Dr. Aundra Dubin and Dr. Orvan Seen at bedside.  Flow taken to 2 lpm; returned to flow of 4.5.

## 2019-01-30 NOTE — Interval H&P Note (Signed)
History and Physical Interval Note:  02/12/2019 12:38 PM  Philip Wolfe  has presented today for surgery, with the diagnosis of PE SHOCK.  The various methods of treatment have been discussed with the patient and family. After consideration of risks, benefits and other options for treatment, the patient has consented to  Procedure(s) with comments: CANNULATION FOR VA ECMO (EXTRACORPOREAL MEMBRANE OXYGENATION) (N/A) - C-ARM TRANSESOPHAGEAL ECHOCARDIOGRAM (TEE) (N/A) as a surgical intervention.  The patient's history has been reviewed, patient examined, no change in status, stable for surgery.  I have reviewed the patient's chart and labs.  Questions were answered to the patient's satisfaction.     [name] [employer]

## 2019-01-30 NOTE — Progress Notes (Signed)
Pt transported from [place] to CT and back without complications.

## 2019-01-30 NOTE — Progress Notes (Signed)
Patient ID: Philip Wolfe, male   DOB: May 10, 1950, 68 y.o.   MRN: [id number]  TCTS Evening Rounds:  Hemodynamically stable on ECMO.  Flow decreased to 2.5.  NE 38 mcg up from 24 this am. Diuresing well on lasix drip.  BMET    Component Value Date/Time   NA 145 01/29/2019 1626   K 4.4 02/16/2019 1626   CL 107 02/11/2019 1607   CO2 28 02/04/2019 1607   GLUCOSE 184 (H) 01/29/2019 1607   BUN 65 (H) 01/25/2019 1607   CREATININE 2.06 (H) 02/05/2019 1607   CALCIUM 7.2 (L) 01/17/2019 1607   GFRNONAA 32 (L) 01/26/2019 1607   GFRAA 38 (L) 02/16/2019 1607   CBC    Component Value Date/Time   WBC 24.1 (H) 01/19/2019 1607   RBC 2.68 (L) 01/29/2019 1607   HGB 8.5 (L) 01/27/2019 1626   HCT 25.0 (L) 01/27/2019 1626   PLT 80 (L) 02/08/2019 1607   MCV 94.0 01/19/2019 1607   MCH 31.3 01/27/2019 1607   MCHC 33.3 02/10/2019 1607   RDW 15.3 02/07/2019 1607   LYMPHSABS 1.9 02/15/2019 0455   MONOABS 2.3 (H) 01/22/2019 0455   EOSABS 0.2 02/08/2019 0455   BASOSABS 0.1 02/14/2019 0455   Sedated on vent.

## 2019-01-30 NOTE — Progress Notes (Signed)
TEE with turn down completed with Dr. Aundra Dubin.  Patient tolerated well.  Will keep flow at 2.5LPM.  Will continue to monitor.

## 2019-01-30 NOTE — Progress Notes (Signed)
Palliative:  HPI: 68 yo male with PMH HTN, arthritis, sleep apnea, obesity admitted 02/13/2019 with hypoxia respiratory failure with massive bilateral PE leading to PEA arrest in ED. Intubated and found to have cardiogenic shock with RV failure. Began [place] ECMO 02/06/2019.   I met today at Philip Wolfe's bedside with wife. Wife reports they are hopeful to transition off ECMO tomorrow. She tells me that she is finally happy to have some good news and tells me that today she is feeling hopeful. She tells me that she is hopeful that as long as he is showing some improvements and hope she will go along with that. She understands that his condition is still very tenuous and that he could decline at any time as well. She tells me that he has been very clear about his wishes and is able to express that she does not believe that he would have wanted this level of care but at the time she was not prepared to make the decision "to just let him go." We did discuss how far and how aggressive they would want to be in case of further decline and she has been thinking about this. She plans to meet with their two adult children to also discuss these topics over the next few days. She also plans to bring a copy of his Living Will.   We were able to reflect on his wishes and he has been very clear on his desire for QOL. He would not want to languish in a nursing facility. Wife reflects that she watched her mother suffer for many years with poor QOL and functional status and she would not want that for herself or her husband. She does not want him to die and is hopeful for improvement but wants him to live with an acceptable QOL. We discussed following his lead and if he continues with improvement we go with that and continue to support him. She is aware and acknowledges the option of comfort focused care if improvement to acceptable QOL becomes less and less likely.   All questions/concerns addressed. Emotional support provided.    Exam: Unresponsive - sedated on VA ECMO. Pale. No distress. HR regular rate and rhythm. Breathing regular, unlabored on vent. Warm to touch.   Plan: - Continued support to wife and continued [place] conversations.  - Hopeful for transition off ECMO tomorrow.   25 min   Philip Sill, NP Palliative Medicine Team Pager [phone] (Please see [email or web address] for schedule) Team Phone [phone]    Greater than 50%  of this time was spent counseling and coordinating care related to the above assessment and plan

## 2019-01-30 NOTE — Progress Notes (Signed)
Patient ID: Philip Wolfe, male   DOB: 1950/12/08, 68 y.o.   MRN: [id number] ]    Advanced Heart Failure Rounding Note  PCP-Cardiologist: No primary care provider on file.   Subjective:    - Massive PE with cardiogenic shock: VA ECMO initiated 12/8. Patient then had PA catheter-directed tPA until 8 am 12/9.  Left leg DVT.  - 12/9 ENT Merocel packing for severe epistaxis.  - PA catheter-directed tPA 12/20 again.    Nasopharyngeal bleeding seems to have resolved after nasal packing by ENT.   Hgb 8.3, has had 1 unit PRBCs overnight, no obvious bleeding.  Plts 72K.   Slowly weaning pressors.  Now off epinephrine and vasopressin and on norepinephrine 28.  MAP 70s.  CVP 10-11 on Lasix gtt 8 mg/hr, net even today, creatinine 1.8.  Heparin gtt ongoing with therapeutic level. iNO at 15 ppm. Co-ox 69% this morning. LDH 370 => 412 => 450. Echo over weekend difficult windows but RV remains hypokinetic (not as bad as initially).   Flow decreased to 2 L/min transiently on ECMO this morning, MAP remained around 62 with increased pulsatility.   Tube feeds ongoing.   Empiric vancomycin/cefepime ongoing, afebrile, cultures remain negative.  WBCs high 39 => 32 => 29 => 24.  Sedated on Versed/Fentanyl. Will open eyes and respond to pain with sedation wean but has not had full wean due to vent dyssynchrony.   ECMO: Stable, no chugging.  Flow 4.5 L/min  3600 rpm Venous pressure -98 Delta P 28  Objective:   Weight Range: (!) 153.2 kg Body mass index is 49.88 kg/m.   Vital Signs:   Temp:  [97.5 F (36.4 C)-98.1 F (36.7 C)] 97.7 F (36.5 C) (12/14 0700) Pulse Rate:  [80-108] 84 (12/14 0700) Resp:  [0-25] 25 (12/14 0700) BP: (85-108)/(60-74) 85/60 (12/13 2345) SpO2:  [99 %-100 %] 99 % (12/14 0700) Arterial Line BP: (85-123)/(60-77) 100/67 (12/14 0700) FiO2 (%):  [30 %] 30 % (12/14 0457) Weight:  [153.2 kg] 153.2 kg (12/14 0500) Last BM Date: (pta)  Weight change: Filed Weights   01/28/19  0600 01/29/19 0630 02/09/2019 0500  Weight: (!) 153.4 kg (!) 152.7 kg (!) 153.2 kg    Intake/Output:   Intake/Output Summary (Last 24 hours) at 02/15/2019 0756 Last data filed at 01/22/2019 0700 Gross per 24 hour  Intake 3823.19 ml  Output 3560 ml  Net 263.19 ml      Physical Exam    General: Intubated/sedated Neck: Thick, JVP difficult, no thyromegaly or thyroid nodule.  Lungs: Decreased at bases.  CV: Nonpalpable PMI.  Heart regular S1/S2, no S3/S4, no murmur.  Anasarca.   Abdomen: Soft, nontender, no hepatosplenomegaly, no distention.  Skin: Intact without lesions or rashes.  Neurologic: sedated. Extremities: No clubbing or cyanosis.  HEENT: Normal.    Telemetry   NSR 80s-90s (personally reviewed)  Labs    CBC Recent Labs    01/26/2019 0214 02/06/2019 0455 01/31/2019 0510  WBC 24.5* 24.3*  --   NEUTROABS  --  16.7*  --   HGB 8.0* 8.3* 7.8*  HCT 23.9* 24.4* 23.0*  MCV 93.0 92.1  --   PLT 67* 72*  --    Basic Metabolic Panel Recent Labs    01/29/19 1549 01/29/19 2212 01/26/2019 0455 02/02/2019 0510  NA 144 144 143 143  K 4.1 4.3 4.2 4.2  CL 107 108 107  --   CO2 _0 --   GLUCOSE 170* 189* 185*  --  BUN 52* 54* 60*  --   CREATININE 1.76* 1.75* 1.84*  --   CALCIUM 7.0* 6.9* 7.0*  --   MG 2.2  --  2.4  --   PHOS 2.8  --  4.5  --    Liver Function Tests Recent Labs    01/29/19 1549 02/14/2019 0455  AST 85* 88*  ALT 34 36  ALKPHOS 60 58  BILITOT 2.1* 2.0*  PROT 4.0* 4.1*  ALBUMIN 2.0* 1.9*   No results for input(s): LIPASE, AMYLASE in the last 72 hours. Cardiac Enzymes No results for input(s): CKTOTAL, CKMB, CKMBINDEX, TROPONINI in the last 72 hours.  BNP: BNP (last 3 results) Recent Labs    02/15/2019 1505  BNP 57.6    ProBNP (last 3 results) No results for input(s): PROBNP in the last 8760 hours.   D-Dimer Recent Labs    01/29/19 0750  DDIMER 0.89*   Hemoglobin A1C No results for input(s): HGBA1C in the last 72 hours. Fasting  Lipid Panel Recent Labs    01/29/2019 0455  TRIG 208*   Thyroid Function Tests No results for input(s): TSH, T4TOTAL, T3FREE, THYROIDAB in the last 72 hours.  Invalid input(s): FREET3  Other results:   Imaging    No results found.   Medications:     Scheduled Medications: . sodium chloride   Intravenous Once  . bisacodyl  10 mg Oral Daily   Or  . bisacodyl  10 mg Rectal Daily  . chlorhexidine gluconate (MEDLINE KIT)  15 mL Mouth Rinse BID  . Chlorhexidine Gluconate Cloth  6 each Topical Daily  . docusate sodium  200 mg Oral Daily  . feeding supplement (PRO-STAT SUGAR FREE 64)  30 mL Per Tube TID  . insulin aspart  0-20 Units Subcutaneous Q4H  . insulin glargine  12 Units Subcutaneous Q24H  . mouth rinse  15 mL Mouth Rinse 10 times per day  . pantoprazole (PROTONIX) IV  40 mg Intravenous Q12H  . sodium chloride flush  10-40 mL Intracatheter Q12H  . sodium chloride flush  3 mL Intravenous Q12H    Infusions: . sodium chloride 10 mL/hr at 01/26/2019 0700  . ceFEPime (MAXIPIME) IV Stopped (01/21/2019 0253)  . dexmedetomidine (PRECEDEX) IV infusion Stopped (01/26/19 1143)  . epinephrine Stopped (01/28/19 0923)  . feeding supplement (VITAL 1.5 CAL) 1,000 mL (01/29/19 1125)  . fentaNYL infusion INTRAVENOUS 300 mcg/hr (02/14/2019 0700)  . furosemide (LASIX) infusion 8 mg/hr (02/08/2019 0700)  . heparin 1,450 Units/hr (02/15/2019 0700)  . insulin Stopped (01/29/19 1338)  . lactated ringers 10 mL/hr at 01/25/19 1031  . lactated ringers Stopped (02/09/2019 2159)  . midazolam 0.5 mg/hr (01/29/2019 0700)  . norepinephrine (LEVOPHED) Adult infusion 28 mcg/min (02/05/2019 0729)  . TPN ADULT (ION)    . vasopressin (PITRESSIN) infusion - *FOR SHOCK* Stopped (01/28/19 2245)    PRN Medications: sodium chloride, bisacodyl, calcium chloride, dextrose, fentaNYL, fentaNYL, metoprolol tartrate, midazolam, midazolam, morphine injection, ondansetron (ZOFRAN) IV, oxyCODONE, sodium chloride flush, sodium  chloride flush, traMADol   Assessment/Plan   1.  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  VA ECMO initiated 12/8.  Currently good flow (4-5 L/min range) at 3600 rpm.  Still with significant pressor requirement despite catheter-directed tPA x 2 now.  He remains on norepinephrine 28, off vasopressin and epinephrine.  Echo from weekend reviewed, RV still hypokinetic though with some improvement from the initial echo.  MAP 70s with CVP 10. Co-ox 69%.  Now on Lasix gtt at 8 mg/hr with even I/Os and CVP 10-11.  Creatinine mildly higher 1.8. Weaned flow transiently, he maintained BP reasonably well with pulsatility.  - Titrate down on norepinephrine today as tolerated, ok for MAP around 70.   - Continue iNO 15 ppm, would like him eventually on sildenafil.   - Repeat CTA today to see if we continue slow weaning of support or need to consider surgical thromboendarterectomy.  IR-directed thrombectomy may be option.   - Continue Lasix gtt 8 mg/hr and replace K.  - TEE today to visualized flow wean.  2. AKI: Creatinine mildly higher at 1.8, follow closely.  Will not try to aggressively diurese (increase Lasix) today given contrast with CTA.   3. Acute hypoxic/hypoxemic respiratory failure: FiO2 0.4, improved ABG on ECMO.  4. ID: WBCs elevated but afebrile.  No PNA on CXR.  PCT not elevated initially.  Blood and trach aspirate cultures so far negative.  - Empiric coverage with cefepime and vancomycin.  5. Pulmonary embolus: Massive bilateral PE with left leg DVT.  He got 150 mg IV total tPA initially, then catheter directed tPA to both PAs on 12/9 and again on 12/10.  - Continue to review with cardiac surgeons => as above, CTA chest today and will reassess need for attempt at surgical treatment. IR thrombectomy may be option as well.  - Continue heparin gtt, level therapeutic.  6. ENT: ENT bleeding, has had nasal packing by ENT, resolved.  7. Acute blood loss anemia:  ENT bleeding initially but this has slowed.  Not certain of current source of blood loss, no overt GI bleeding and LDH is stable.  Transfuse hgb < 8. He had 1 more unit PRBCs last night.   - Continue IV Protonix.  8. Thrombocytopenia: Plts 72 K this morning.  Suspect due to critical illness.  - Transfuse plts < 50K.  9. FEN: Continue TFs.   10. Neuro: Lighten sedation to see if we can wake up this morning.   CRITICAL CARE Performed by: Loralie Champagne  Total critical care time: 45 minutes  Critical care time was exclusive of separately billable procedures and treating other patients.  Critical care was necessary to treat or prevent imminent or life-threatening deterioration.  Critical care was time spent personally by me on the following activities: development of treatment plan with patient and/or surrogate as well as nursing, discussions with consultants, evaluation of patient's response to treatment, examination of patient, obtaining history from patient or surrogate, ordering and performing treatments and interventions, ordering and review of laboratory studies, ordering and review of radiographic studies, pulse oximetry and re-evaluation of patient's condition.   Length of Stay: 7  Loralie Champagne, MD  02/12/2019, 7:56 AM  Advanced Heart Failure Team Pager [phone] (M-F; [place])  Please contact [facility] for night-coverage after hours (4p -7a ) and weekends on [email or web address]

## 2019-01-30 NOTE — Progress Notes (Signed)
Patient transported to CT without incident; traveled with  bedside RN; ECMO specialist x2 and RT x2;  Transported on full O2 tank a battery.  Patient returned to room and placed back on wall O2 and AC power.  Will continue to monitor.

## 2019-01-30 NOTE — Progress Notes (Signed)
Subjective: Still sedated. On vent and ECMO. Not responsive. No more bleeding.  Objective: Vital signs in last 24 hours: Temp:  [96.6 F (35.9 C)-98.1 F (36.7 C)] 97.5 F (36.4 C) (12/14 1100) Pulse Rate:  [78-108] 81 (12/14 1149) Resp:  [0-25] 19 (12/14 1149) BP: (85-99)/(60-67) 85/60 (12/13 2345) SpO2:  [98 %-100 %] 98 % (12/14 1149) Arterial Line BP: (85-123)/(60-77) 86/60 (12/14 1100) FiO2 (%):  [30 %] 30 % (12/14 1149) Weight:  [153.2 kg] 153.2 kg (12/14 0500)  Physical exam: General appearance:Morbidly obese andintubated. Sedated. Not responsive. Head:Normocephalic, without obvious abnormality, atraumatic Ears: Examination of the ears shows normal auricles and external auditory canals bilaterally.  Nose: Nasal packing in placebilaterally. No bleeding. Face: Facial examination shows no asymmetry.  Mouth:ET tube in place.No more pharyngeal bleeding. Neck:Palpation of the neck reveals no lymphadenopathy or mass. The trachea is midline. The thyroid is not significantly enlarged.  Neuro:Sedated. Not responsive.  Recent Labs    01/27/2019 0455 01/22/2019 0510 02/04/2019 1100  WBC 24.3*  --  24.4*  HGB 8.3* 7.8* 7.8*  HCT 24.4* 23.0* 23.6*  PLT 72*  --  71*   Recent Labs    01/29/19 2212 02/16/2019 0455 01/17/2019 0510  NA 144 143 143  K 4.3 4.2 4.2  CL 108 107  --   CO2 27 26  --   GLUCOSE 189* 185*  --   BUN 54* 60*  --   CREATININE 1.75* 1.84*  --   CALCIUM 6.9* 7.0*  --     Medications:  I have reviewed the patient's current medications. Scheduled: . sodium chloride   Intravenous Once  . bisacodyl  10 mg Oral Daily   Or  . bisacodyl  10 mg Rectal Daily  . chlorhexidine gluconate (MEDLINE KIT)  15 mL Mouth Rinse BID  . Chlorhexidine Gluconate Cloth  6 each Topical Daily  . docusate sodium  200 mg Oral Daily  . feeding supplement (PRO-STAT SUGAR FREE 64)  30 mL Per Tube TID  . insulin aspart  0-20 Units Subcutaneous Q4H  . insulin glargine  12 Units  Subcutaneous Q24H  . mouth rinse  15 mL Mouth Rinse 10 times per day  . pantoprazole (PROTONIX) IV  40 mg Intravenous Q12H  . sodium chloride flush  10-40 mL Intracatheter Q12H  . sodium chloride flush  3 mL Intravenous Q12H   Continuous: . sodium chloride 10 mL/hr at 01/27/2019 0800  . sodium chloride 20 mL/hr at 02/02/2019 0833  . albumin human    . ceFEPime (MAXIPIME) IV Stopped (02/02/2019 1058)  . dexmedetomidine (PRECEDEX) IV infusion Stopped (01/26/19 1143)  . epinephrine Stopped (01/28/19 0349)  . feeding supplement (VITAL 1.5 CAL) 1,000 mL (02/09/2019 0832)  . fentaNYL infusion INTRAVENOUS 275 mcg/hr (01/27/2019 1100)  . furosemide (LASIX) infusion 8 mg/hr (02/07/2019 1100)  . heparin 1,450 Units/hr (01/28/2019 1100)  . insulin Stopped (01/29/19 1338)  . lactated ringers 10 mL/hr at 01/25/19 1031  . lactated ringers Stopped (02/14/2019 2159)  . midazolam Stopped (02/16/2019 1001)  . norepinephrine (LEVOPHED) Adult infusion 26 mcg/min (01/22/2019 1100)  . vasopressin (PITRESSIN) infusion - *FOR SHOCK* Stopped (01/28/19 2245)    Assessment/Plan: Recent severe bilateralepistaxiss/p TPA treatment for massive pulmonary emboli.Bleeding is now under control. -Will leave packing in place while he is receiving high dose heparin. - Continue antibiotic while packing is in place.   LOS: 7 days   Philip Wolfe W [place] [employer] 01/27/2019, 12:07 PM

## 2019-01-31 ENCOUNTER — Encounter (HOSPITAL_COMMUNITY): Payer: Self-pay | Admitting: Pulmonary Disease

## 2019-01-31 ENCOUNTER — Inpatient Hospital Stay (HOSPITAL_COMMUNITY): Payer: BC Managed Care – PPO

## 2019-01-31 DIAGNOSIS — I2699 Other pulmonary embolism without acute cor pulmonale: Secondary | ICD-10-CM

## 2019-01-31 LAB — COMPREHENSIVE METABOLIC PANEL
ALT: 42 U/L (ref 0–44)
ALT: 43 U/L (ref 0–44)
AST: 98 U/L — ABNORMAL HIGH (ref 15–41)
AST: 101 U/L — ABNORMAL HIGH (ref 15–41)
Albumin: 2.1 g/dL — ABNORMAL LOW (ref 3.5–5.0)
Albumin: 2.2 g/dL — ABNORMAL LOW (ref 3.5–5.0)
Alkaline Phosphatase: 71 U/L (ref 38–126)
Alkaline Phosphatase: 99 U/L (ref 38–126)
Anion gap: 11 (ref 5–15)
Anion gap: 16 — ABNORMAL HIGH (ref 5–15)
BUN: 73 mg/dL — ABNORMAL HIGH (ref 8–23)
BUN: 82 mg/dL — ABNORMAL HIGH (ref 8–23)
CO2: 26 mmol/L (ref 22–32)
CO2: 26 mmol/L (ref 22–32)
Calcium: 7.5 mg/dL — ABNORMAL LOW (ref 8.9–10.3)
Calcium: 7.6 mg/dL — ABNORMAL LOW (ref 8.9–10.3)
Chloride: 107 mmol/L (ref 98–111)
Chloride: 106 mmol/L (ref 98–111)
Creatinine, Ser: 2.42 mg/dL — ABNORMAL HIGH (ref 0.61–1.24)
Creatinine, Ser: 2.66 mg/dL — ABNORMAL HIGH (ref 0.61–1.24)
GFR calc Af Amer: 31 mL/min — ABNORMAL LOW (ref >60)
GFR calc Af Amer: 28 mL/min — ABNORMAL LOW (ref >60)
GFR calc non Af Amer: 27 mL/min — ABNORMAL LOW (ref >60)
GFR calc non Af Amer: 24 mL/min — ABNORMAL LOW (ref >60)
Glucose, Bld: 185 mg/dL — ABNORMAL HIGH (ref 70–99)
Glucose, Bld: 175 mg/dL — ABNORMAL HIGH (ref 70–99)
Potassium: 4.4 mmol/L (ref 3.5–5.1)
Potassium: 4.3 mmol/L (ref 3.5–5.1)
Sodium: 144 mmol/L (ref 135–145)
Sodium: 148 mmol/L — ABNORMAL HIGH (ref 135–145)
Total Bilirubin: 2.4 mg/dL — ABNORMAL HIGH (ref 0.3–1.2)
Total Bilirubin: 2 mg/dL — ABNORMAL HIGH (ref 0.3–1.2)
Total Protein: 4.5 g/dL — ABNORMAL LOW (ref 6.5–8.1)
Total Protein: 4.6 g/dL — ABNORMAL LOW (ref 6.5–8.1)

## 2019-01-31 LAB — POCT I-STAT 7, (LYTES, BLD GAS, ICA,H+H)
Acid-Base Excess: 5 mmol/L — ABNORMAL HIGH (ref 0.0–2.0)
Acid-Base Excess: 6 mmol/L — ABNORMAL HIGH (ref 0.0–2.0)
Acid-Base Excess: 8 mmol/L — ABNORMAL HIGH (ref 0.0–2.0)
Acid-Base Excess: 3 mmol/L — ABNORMAL HIGH (ref 0.0–2.0)
Acid-Base Excess: 2 mmol/L (ref 0.0–2.0)
Bicarbonate: 29.6 mmol/L — ABNORMAL HIGH (ref 20.0–28.0)
Bicarbonate: 30.7 mmol/L — ABNORMAL HIGH (ref 20.0–28.0)
Bicarbonate: 32.5 mmol/L — ABNORMAL HIGH (ref 20.0–28.0)
Bicarbonate: 27.7 mmol/L (ref 20.0–28.0)
Bicarbonate: 26.8 mmol/L (ref 20.0–28.0)
Calcium, Ion: 1.03 mmol/L — ABNORMAL LOW (ref 1.15–1.40)
Calcium, Ion: 1.04 mmol/L — ABNORMAL LOW (ref 1.15–1.40)
Calcium, Ion: 1.05 mmol/L — ABNORMAL LOW (ref 1.15–1.40)
Calcium, Ion: 1.08 mmol/L — ABNORMAL LOW (ref 1.15–1.40)
Calcium, Ion: 1.04 mmol/L — ABNORMAL LOW (ref 1.15–1.40)
HCT: 22 % — ABNORMAL LOW (ref 39.0–52.0)
HCT: 22 % — ABNORMAL LOW (ref 39.0–52.0)
HCT: 23 % — ABNORMAL LOW (ref 39.0–52.0)
HCT: 23 % — ABNORMAL LOW (ref 39.0–52.0)
HCT: 21 % — ABNORMAL LOW (ref 39.0–52.0)
Hemoglobin: 7.5 g/dL — ABNORMAL LOW (ref 13.0–17.0)
Hemoglobin: 7.5 g/dL — ABNORMAL LOW (ref 13.0–17.0)
Hemoglobin: 7.8 g/dL — ABNORMAL LOW (ref 13.0–17.0)
Hemoglobin: 7.8 g/dL — ABNORMAL LOW (ref 13.0–17.0)
Hemoglobin: 7.1 g/dL — ABNORMAL LOW (ref 13.0–17.0)
O2 Saturation: 100 %
O2 Saturation: 97 %
O2 Saturation: 98 %
O2 Saturation: 96 %
O2 Saturation: 94 %
Patient temperature: 36.7
Patient temperature: 36.9
Patient temperature: 98.6
Patient temperature: 36.6
Potassium: 4.3 mmol/L (ref 3.5–5.1)
Potassium: 4.3 mmol/L (ref 3.5–5.1)
Potassium: 4.4 mmol/L (ref 3.5–5.1)
Potassium: 4.1 mmol/L (ref 3.5–5.1)
Potassium: 3.9 mmol/L (ref 3.5–5.1)
Sodium: 143 mmol/L (ref 135–145)
Sodium: 145 mmol/L (ref 135–145)
Sodium: 145 mmol/L (ref 135–145)
Sodium: 146 mmol/L — ABNORMAL HIGH (ref 135–145)
Sodium: 147 mmol/L — ABNORMAL HIGH (ref 135–145)
TCO2: 31 mmol/L (ref 22–32)
TCO2: 32 mmol/L (ref 22–32)
TCO2: 34 mmol/L — ABNORMAL HIGH (ref 22–32)
TCO2: 29 mmol/L (ref 22–32)
TCO2: 28 mmol/L (ref 22–32)
pCO2 arterial: 43.7 mmHg (ref 32.0–48.0)
pCO2 arterial: 44.5 mmHg (ref 32.0–48.0)
pCO2 arterial: 46 mmHg (ref 32.0–48.0)
pCO2 arterial: 40.7 mmHg (ref 32.0–48.0)
pCO2 arterial: 40.2 mmHg (ref 32.0–48.0)
pH, Arterial: 7.431 (ref 7.350–7.450)
pH, Arterial: 7.439 (ref 7.350–7.450)
pH, Arterial: 7.472 — ABNORMAL HIGH (ref 7.350–7.450)
pH, Arterial: 7.439 (ref 7.350–7.450)
pH, Arterial: 7.432 (ref 7.350–7.450)
pO2, Arterial: 104 mmHg (ref 83.0–108.0)
pO2, Arterial: 522 mmHg — ABNORMAL HIGH (ref 83.0–108.0)
pO2, Arterial: 93 mmHg (ref 83.0–108.0)
pO2, Arterial: 78 mmHg — ABNORMAL LOW (ref 83.0–108.0)
pO2, Arterial: 68 mmHg — ABNORMAL LOW (ref 83.0–108.0)

## 2019-01-31 LAB — CBC
HCT: 23.6 % — ABNORMAL LOW (ref 39.0–52.0)
HCT: 24.3 % — ABNORMAL LOW (ref 39.0–52.0)
HCT: 26 % — ABNORMAL LOW (ref 39.0–52.0)
Hemoglobin: 7.9 g/dL — ABNORMAL LOW (ref 13.0–17.0)
Hemoglobin: 7.9 g/dL — ABNORMAL LOW (ref 13.0–17.0)
Hemoglobin: 8.4 g/dL — ABNORMAL LOW (ref 13.0–17.0)
MCH: 31.2 pg (ref 26.0–34.0)
MCH: 30.5 pg (ref 26.0–34.0)
MCH: 30.8 pg (ref 26.0–34.0)
MCHC: 33.5 g/dL (ref 30.0–36.0)
MCHC: 32.5 g/dL (ref 30.0–36.0)
MCHC: 32.3 g/dL (ref 30.0–36.0)
MCV: 93.3 fL (ref 80.0–100.0)
MCV: 93.8 fL (ref 80.0–100.0)
MCV: 95.2 fL (ref 80.0–100.0)
Platelets: 90 10*3/uL — ABNORMAL LOW (ref 150–400)
Platelets: 81 10*3/uL — ABNORMAL LOW (ref 150–400)
Platelets: 79 10*3/uL — ABNORMAL LOW (ref 150–400)
RBC: 2.53 MIL/uL — ABNORMAL LOW (ref 4.22–5.81)
RBC: 2.59 MIL/uL — ABNORMAL LOW (ref 4.22–5.81)
RBC: 2.73 MIL/uL — ABNORMAL LOW (ref 4.22–5.81)
RDW: 16.8 % — ABNORMAL HIGH (ref 11.5–15.5)
RDW: 16.8 % — ABNORMAL HIGH (ref 11.5–15.5)
RDW: 16.9 % — ABNORMAL HIGH (ref 11.5–15.5)
WBC: 22.2 10*3/uL — ABNORMAL HIGH (ref 4.0–10.5)
WBC: 17.1 10*3/uL — ABNORMAL HIGH (ref 4.0–10.5)
WBC: 15.7 10*3/uL — ABNORMAL HIGH (ref 4.0–10.5)
nRBC: 11 % — ABNORMAL HIGH (ref 0.0–0.2)
nRBC: 8.2 % — ABNORMAL HIGH (ref 0.0–0.2)
nRBC: 6.3 % — ABNORMAL HIGH (ref 0.0–0.2)

## 2019-01-31 LAB — LACTATE DEHYDROGENASE
LDH: 447 U/L — ABNORMAL HIGH (ref 98–192)
LDH: 414 U/L — ABNORMAL HIGH (ref 98–192)

## 2019-01-31 LAB — GLUCOSE, CAPILLARY
Glucose-Capillary: 174 mg/dL — ABNORMAL HIGH (ref 70–99)
Glucose-Capillary: 174 mg/dL — ABNORMAL HIGH (ref 70–99)
Glucose-Capillary: 187 mg/dL — ABNORMAL HIGH (ref 70–99)
Glucose-Capillary: 199 mg/dL — ABNORMAL HIGH (ref 70–99)
Glucose-Capillary: 165 mg/dL — ABNORMAL HIGH (ref 70–99)
Glucose-Capillary: 166 mg/dL — ABNORMAL HIGH (ref 70–99)
Glucose-Capillary: 146 mg/dL — ABNORMAL HIGH (ref 70–99)
Glucose-Capillary: 159 mg/dL — ABNORMAL HIGH (ref 70–99)

## 2019-01-31 LAB — COOXEMETRY PANEL
Carboxyhemoglobin: 2.1 % — ABNORMAL HIGH (ref 0.5–1.5)
Methemoglobin: 1.5 % (ref 0.0–1.5)
O2 Saturation: 79.6 %
Total hemoglobin: 9.5 g/dL — ABNORMAL LOW (ref 12.0–16.0)

## 2019-01-31 LAB — BASIC METABOLIC PANEL
Anion gap: 13 (ref 5–15)
BUN: 82 mg/dL — ABNORMAL HIGH (ref 8–23)
CO2: 26 mmol/L (ref 22–32)
Calcium: 7.7 mg/dL — ABNORMAL LOW (ref 8.9–10.3)
Chloride: 107 mmol/L (ref 98–111)
Creatinine, Ser: 2.59 mg/dL — ABNORMAL HIGH (ref 0.61–1.24)
GFR calc Af Amer: 28 mL/min — ABNORMAL LOW (ref >60)
GFR calc non Af Amer: 25 mL/min — ABNORMAL LOW (ref >60)
Glucose, Bld: 168 mg/dL — ABNORMAL HIGH (ref 70–99)
Potassium: 4.1 mmol/L (ref 3.5–5.1)
Sodium: 146 mmol/L — ABNORMAL HIGH (ref 135–145)

## 2019-01-31 LAB — PATHOLOGIST SMEAR REVIEW

## 2019-01-31 LAB — CORTISOL: Cortisol, Plasma: 35.1 ug/dL

## 2019-01-31 LAB — PREPARE RBC (CROSSMATCH)

## 2019-01-31 LAB — HEPARIN LEVEL (UNFRACTIONATED)
Heparin Unfractionated: 0.35 IU/mL (ref 0.30–0.70)
Heparin Unfractionated: 0.3 IU/mL (ref 0.30–0.70)

## 2019-01-31 LAB — APTT: aPTT: 84 seconds — ABNORMAL HIGH (ref 24–36)

## 2019-01-31 LAB — PROTIME-INR
INR: 1.4 — ABNORMAL HIGH (ref 0.8–1.2)
Prothrombin Time: 16.9 seconds — ABNORMAL HIGH (ref 11.4–15.2)

## 2019-01-31 LAB — MAGNESIUM: Magnesium: 2.5 mg/dL — ABNORMAL HIGH (ref 1.7–2.4)

## 2019-01-31 LAB — LACTIC ACID, PLASMA: Lactic Acid, Venous: 1.4 mmol/L (ref 0.5–1.9)

## 2019-01-31 LAB — HEMOSIDERIN, URINE: Hemosiderin Qual, Ur: ABSENT

## 2019-01-31 MED ORDER — SODIUM CHLORIDE 0.9 % IV SOLN
2.0000 g | Freq: Three times a day (TID) | INTRAVENOUS | Status: DC
  Administered 2019-01-31 – 2019-02-01 (×3): 2 g via INTRAVENOUS
  Filled 2019-01-31 (×6): qty 2

## 2019-01-31 MED ORDER — SODIUM CHLORIDE 0.9% IV SOLUTION: Freq: Once | INTRAVENOUS | Status: AC

## 2019-01-31 MED ORDER — VITAL 1.5 CAL PO LIQD
1000.0000 mL | ORAL | Status: DC
  Administered 2019-01-31: 12:00:00 1000 mL
  Filled 2019-01-31: qty 1000

## 2019-01-31 MED ORDER — ALBUMIN HUMAN 25 % IV SOLN
50.0000 g | Freq: Once | INTRAVENOUS | Status: AC
  Administered 2019-01-31: 50 g via INTRAVENOUS
  Filled 2019-01-31: qty 50

## 2019-01-31 MED ORDER — CALCIUM GLUCONATE-NACL 2-0.675 GM/100ML-% IV SOLN
2.0000 g | Freq: Once | INTRAVENOUS | Status: AC
  Administered 2019-01-31: 2000 mg via INTRAVENOUS
  Filled 2019-01-31: qty 100

## 2019-01-31 MED ORDER — ALBUMIN HUMAN 25 % IV SOLN
12.5000 g | Freq: Once | INTRAVENOUS | Status: AC
  Administered 2019-01-31: 12.5 g via INTRAVENOUS
  Filled 2019-01-31: qty 50

## 2019-01-31 MED ORDER — SODIUM CHLORIDE 0.9 % IV SOLN
1.2500 ng/kg/min | INTRAVENOUS | Status: AC
  Administered 2019-01-31: 40 ng/kg/min via INTRAVENOUS
  Administered 2019-01-31: 5 ng/kg/min via INTRAVENOUS
  Administered 2019-02-01: 40.027 ng/kg/min via INTRAVENOUS
  Administered 2019-02-02: 40 ng/kg/min via INTRAVENOUS
  Filled 2019-01-31 (×7): qty 1

## 2019-01-31 MED ORDER — SODIUM CHLORIDE 0.9 % IV SOLN
2.0000 g | Freq: Once | INTRAVENOUS | Status: DC
  Filled 2019-01-31: qty 20

## 2019-01-31 MED ORDER — VITAL 1.5 CAL PO LIQD
1000.0000 mL | ORAL | Status: DC
  Administered 2019-01-31 – 2019-02-03 (×4): 1000 mL
  Filled 2019-01-31 (×4): qty 1000

## 2019-01-31 NOTE — Progress Notes (Signed)
1700 Hgb: Spoke with Dr. Tamala Julian concerning Hgb 7.1. MD wants to recheck CBC at 2300, no transfusion at this time.

## 2019-01-31 NOTE — Progress Notes (Signed)
NAME:  SEMISI Wolfe, MRN:  [id number], DOB:  1951/02/01, LOS: 8 ADMISSION DATE:  02/06/2019, CONSULTATION DATE:  02/14/2019 REFERRING MD:  Dr. Madilyn Hook EDP, CHIEF COMPLAINT:  Hypoxia   Brief History   68 year old male admitted 12/7 with massive PE s/p thrombolytics and catheter directed lysis.  Has cardiac arrest, significant RV failure requiring initiation of ECMO  Past Medical History   has a past medical history of Arthritis, Hypertension, Sleep apnea, Ulcer (left medial ankle), and Venous stasis ulcer (HCC).   Significant Hospital Events   12/7-Admit, intubated coded, given TPA 800 mg and then 50 mg 6 hours later 12/8- Cannulated for VA ECMO 01/24/19 and underwent EKOS directed catheter placement.  Inhaled NO started 12/9- Significant epistaxis requiring 3 units PRBC and nasal packing by ENT, bronchoscopy with no evidence of pulmonary bleed 12/10- Second round of EKOS directed catheter TPA, nasal packing replaced by [facility] catheter by ENT 12/11-reviewed echo with cardiology.  RV function is improving.  Held off further lytics/thrombectomy 12/13-requiring daily blood transfusions.  No evidence of overt bleeding.  Pressors weaning down 12/14 TEE with flow wean  Consults:  PCCM, cardiology, cardiothoracic surgery  Procedures:  R axillary arterial catheter, R femoral venous catheter, RIJ introducer, LIJ MML, L radial artery  Significant Diagnostic Tests:  CTA 12/7-massive bilateral central pulmonary emboli with significant decrease in pulmonary artery flow, patchy groundglass infiltrate, rib fractures on the right.  CT head 12/7-generalized atrophy, chronic ischemic microangiopathy.  Echo 12/9-severe reduction in RV systolic function with RV volume and pressure overload.  LVEF 50-55%  Micro Data:  Blood cultures 12/7-negative Blood culture 12/8-negative MRSA PCR 12/8-negative Respiratory culture 12/10-negative  Antimicrobials:  Cefepime 12/10 > Vanco 12/9 >> 12/13  Interim  history/subjective:  Good UoP, creatinine up. Had to go back up on flows due to increasing pressor requirements.  Objective   Blood pressure (!) 89/57, pulse 94, temperature 97.7 F (36.5 C), resp. rate 19, height 5\' 9"  (1.753 m), weight (!) 149.9 kg, SpO2 100 %. CVP:  [8 mmHg-12 mmHg] 8 mmHg  Vent Mode: PCV FiO2 (%):  [30 %-40 %] 40 % Set Rate:  [15 bmp] 15 bmp PEEP:  [5 cmH20] 5 cmH20 Plateau Pressure:  [13 cmH20-15 cmH20] 15 cmH20   Intake/Output Summary (Last 24 hours) at 01/31/2019 02/02/2019 Last data filed at 01/31/2019 0700 Gross per 24 hour  Intake 3934.55 ml  Output 4895 ml  Net -960.45 ml   Filed Weights   01/29/19 0630 02/07/2019 0500 01/31/19 0500  Weight: (!) 152.7 kg (!) 153.2 kg (!) 149.9 kg   Examination: GEN: overweight ill appearing man in NAD HEENT: ETT in place, minimal bloody secretions CV: diminished, ext warm PULM: Diminished at bases, otherwise clear, triggering vent GI: Soft, hypoactive BS EXT: Marked diffuse anasarca NEURO: Withdraws to pain PSYCH: heavily sedated SKIN: pale, no rashes  Sugars look okay Cr up to 2.4 PLts better HgB continues slow drop Head/Abd/Pelvis CT benign CTA chest showing improved clot burden  Resolved Hospital Problem list     Assessment & Plan:  # Acute cor pulmonale due to massive PE- S/p TPA x 2 and EKOS X 2, on heparin drip and VA ECMO.  Suspect this is PE on CTEPH given clinical history and response to therapy. # Acute hypoxic, hypercarbic respiratory failure- in setting of above, on lung protective tidal volumes with gas exchange handled primarily through sweep changes; mechanics much better today after ETT exchanged for 8.5 and therapeutic bronch by Dr. 02/02/19  on 12/13 # Ongoing transfusion-dependent anemia- LDH stable but low grade hemolysis inevitable, had significant nasopharyngeal bleeding but this now under control, no other clear signs of bleeding other than scattered bruising # Leukocytosis- stable, reactive,  no obvious infection # Thrombocytopenia- reactive and some consumption due to ECMO inevitable, looks okay today # Epistaxis- packed by ENT with good response, appreciate help Continue empiric antibiotic for leukocytosis.  ENT wants empiric abx while packing in place. # Hyperglyemia- on lantus and SSI # Diffuse anasarca- related to reactive hypo-osmolar state, immobility, and fluid administration, probable mild volume overloaded state of heart but need to keep kidneys in mind # Acute kidney injury- combination cardiorenal syndrome and ATN, attempting diuresis with fair results   - Continue VA ECMO at higher flows - Issues include (A) trying to figure out if this is cardiac-mediated or vasoplegia causing flow dependence suspecting latter, (B) determining effectiveness (if any) of pulmonary vasodilators, (C) determining if there is any role for IR directed intravascular extraction of R PA clot (D) determining continued lasix vs. Initiation of iHD. - High SvO2, low CVP, high pulse pressures, TEE findings are all consistent with low SVR, today will add giapreza and attempt to wean flows again. - After giapreza on board, dose of revatio and wean off iNO.  Finally do a trial of flow wean later this afternoon. - Check afternoon BMP and need to make determination regarding continued lasix drip. - Continue vent at current settings - Continue nasal packing for now, antibiotics while on this - Check cortisol, low threshold for stress steroids - Continue lantus and SSI at current dosing  42 minutes critical care time Erskine Emery MD PCCM

## 2019-01-31 NOTE — Progress Notes (Signed)
Patient ID: Philip Wolfe, male   DOB: [birth year], 68 y.o.   MRN: [id number] ]    Advanced Heart Failure Rounding Note  PCP-Cardiologist: No primary care provider on file.   Subjective:    - Massive PE with cardiogenic shock: [facility] ECMO initiated 12/8. Patient then had PA catheter-directed tPA until 8 am 12/9.  Left leg DVT.  - 12/9 ENT Merocel packing for severe epistaxis.  - PA catheter-directed tPA 12/10 again.   - TEE-guided ECMO wean on 12/14, initially did well with flow as low as 1 L/min, left at 2.5 L/min.  However, overnight drop in MAP with increase NE so increased flow back to 4.5.   Nasopharyngeal bleeding seems to have resolved after nasal packing by ENT.   Hgb 7.9, has had 1 unit PRBCs overnight, no obvious bleeding.  Plts 90K.   Now off epinephrine and vasopressin and on norepinephrine 40.  MAP 65-70.  CVP 9 on Lasix gtt 8 mg/hr, net slightly negative.  Creatinine up to 2.4 from 1.8 after CTA chest with contrast load yesterday.  Heparin gtt ongoing with therapeutic level. iNO at 10 ppm. MAP seemed to drop with sildenafil dose yesterday.  Co-ox 79% this morning. LDH 370 => 412 => 450 => 447. TEE 12/14 showed LV EF 40%, RV systolic function appeared near normal.  SVR 295 today.   Tube feeds ongoing.   Empiric cefepime ongoing, afebrile, cultures remain negative.  WBCs high 39 => 32 => 29 => 24 => 22.  Sedated on Versed/Fentanyl. Will open eyes and respond to pain with sedation wean but has not had full wean due to vent dyssynchrony.   ECMO: Stable, no chugging.  Flow 4.5 L/min  3600 rpm Venous pressure -94 Delta P 29  Objective:   Weight Range: (!) 149.9 kg Body mass index is 48.8 kg/m.   Vital Signs:   Temp:  [96.6 F (35.9 C)-98.4 F (36.9 C)] 97.9 F (36.6 C) (12/15 0700) Pulse Rate:  [78-100] 90 (12/15 0700) Resp:  [0-33] 33 (12/15 0700) BP: (89)/(57) 89/57 (12/14 1516) SpO2:  [98 %-100 %] 100 % (12/15 0700) Arterial Line BP: (79-117)/(51-71) 104/64 (12/15  0545) FiO2 (%):  [30 %-40 %] 40 % (12/15 0416) Weight:  [149.9 kg] 149.9 kg (12/15 0500) Last BM Date: (pta)  Weight change: Filed Weights   01/29/19 0630 02/05/2019 0500 01/31/19 0500  Weight: (!) 152.7 kg (!) 153.2 kg (!) 149.9 kg    Intake/Output:   Intake/Output Summary (Last 24 hours) at 01/31/2019 0744 Last data filed at 01/31/2019 0700 Gross per 24 hour  Intake 3934.55 ml  Output 4895 ml  Net -960.45 ml      Physical Exam    General: Intubated/sedated Neck: Thick, JVP difficult, no thyromegaly or thyroid nodule.  Lungs: Decreased at bases.  CV: Nonpalpable PMI.  Heart regular S1/S2, no S3/S4, no murmur.  Anasarca.   Abdomen: Soft, nontender, no hepatosplenomegaly, no distention.  Skin: Intact without lesions or rashes.  Neurologic: sedated. Extremities: No clubbing or cyanosis.  HEENT: Normal.    Telemetry   NSR 80s-90s (personally reviewed)  Labs    CBC Recent Labs    02/15/2019 0455 01/28/2019 0510 01/17/2019 2044 01/31/19 0320 01/31/19 0331  WBC 24.3*   < > 24.4*  --  22.2*  NEUTROABS 16.7*  --   --   --   --   HGB 8.3*  --  8.4* 7.8* 7.9*  HCT 24.4*  --  24.7* 23.0* 23.6*  MCV 92.1   < >  93.2  --  93.3  PLT 72*   < > 81*  --  90*   < > = values in this interval not displayed.   Basic Metabolic Panel Recent Labs    01/29/19 1549 01/29/19 1556 01/29/2019 0455 01/28/2019 0510 01/22/2019 1607 01/31/19 0320 01/31/19 0331  NA 144  --  143  --  145 145 144  K 4.1  --  4.2  --  4.4 4.4 4.4  CL 107   < > 107   < > 107  --  107  CO2 26   < > 26   < > 28  --  26  GLUCOSE 170*   < > 185*   < > 184*  --  185*  BUN 52*   < > 60*   < > 65*  --  73*  CREATININE 1.76*   < > 1.84*   < > 2.06*  --  2.42*  CALCIUM 7.0*   < > 7.0*   < > 7.2*  --  7.5*  MG 2.2  --  2.4  --   --   --  2.5*  PHOS 2.8  --  4.5  --   --   --   --    < > = values in this interval not displayed.   Liver Function Tests Recent Labs    02/06/2019 1607 01/31/19 0331  AST 95* 98*  ALT  40 42  ALKPHOS 67 71  BILITOT 2.0* 2.4*  PROT 4.3* 4.5*  ALBUMIN 2.0* 2.1*   No results for input(s): LIPASE, AMYLASE in the last 72 hours. Cardiac Enzymes No results for input(s): CKTOTAL, CKMB, CKMBINDEX, TROPONINI in the last 72 hours.  BNP: BNP (last 3 results) Recent Labs    02/09/2019 1505  BNP 57.6    ProBNP (last 3 results) No results for input(s): PROBNP in the last 8760 hours.   D-Dimer Recent Labs    01/29/19 0750  DDIMER 0.89*   Hemoglobin A1C No results for input(s): HGBA1C in the last 72 hours. Fasting Lipid Panel Recent Labs    01/29/2019 0455  TRIG 208*   Thyroid Function Tests No results for input(s): TSH, T4TOTAL, T3FREE, THYROIDAB in the last 72 hours.  Invalid input(s): FREET3  Other results:   Imaging    CT ABDOMEN PELVIS WO CONTRAST  Result Date: 01/28/2019 CLINICAL DATA:  High pretest probability for acute pulmonary embolus. EXAM: CT ANGIOGRAPHY CHEST CT ABDOMEN AND PELVIS WITH CONTRAST TECHNIQUE: Multidetector CT imaging of the chest was performed using the standard protocol during bolus administration of intravenous contrast. Multiplanar CT image reconstructions and MIPs were obtained to evaluate the vascular anatomy. Multidetector CT imaging of the abdomen and pelvis was performed using the standard protocol during bolus administration of intravenous contrast. CONTRAST:  6m OMNIPAQUE IOHEXOL 350 MG/ML SOLN COMPARISON:  None. FINDINGS: CTA CHEST FINDINGS Cardiovascular: Large clot within the right main pulmonary artery is again identified. Similar to previous exam. Bilateral lobar and segmental pulmonary artery filling defects are also noted which appear improved in the interval. No new clot burden identified. Increased small pericardial effusion. Mediastinum/Nodes: ETT tip is above the carina. Nasogastric tube tip is in the proximal stomach. No mediastinal or hilar adenopathy. Lungs/Pleura: Bilateral pleural effusions are again noted.  Worsening airspace consolidation to both lower lobes. The upper lobes and right middle lobe remain relatively well aerated. Patchy area of airspace consolidation in the lingula is noted, new from previous study. Musculoskeletal: Thoracic spondylosis. Review  of the MIP images confirms the above findings. CT ABDOMEN and PELVIS FINDINGS Hepatobiliary: Diminished exam detail within the abdomen and pelvis secondary to patient body habitus, artifact from overlying support apparatus, and sub optimal opacification of solid organs. There is vicarious excretion of contrast into the gallbladder. No suspicious liver abnormality identified. Pancreas: Unremarkable. No pancreatic ductal dilatation or surrounding inflammatory changes. Spleen: Normal in size without focal abnormality. Adrenals/Urinary Tract: Normal appearance of the adrenal glands. No hydronephrosis identified bilaterally. Exophytic lesion arising from lateral cortex of upper pole of right kidney measures 1.4 cm. This is favored to represent a small cyst. No mass or hydronephrosis noted bilaterally. Urinary bladder is partially collapsed around a Foley catheter. Stomach/Bowel: Status post gastric banding. Stomach is otherwise unremarkable. No small bowel dilatation. A lipoma is noted within the right colon measuring 1.5 cm. No bowel wall thickening or inflammation identified. Vascular/Lymphatic: Aortic atherosclerosis. No aneurysm. Large bore right femoral central venous catheter is identified with tip terminating at the cavoatrial junction. Just above the bifurcation of the abdominal aorta, there is a left periaortic node measuring 1.6 cm, image 63/6. Unchanged from CT 12/02/2016. Reproductive: Prostate is unremarkable. Other: No free fluid or fluid collections. Diffuse body wall edema is identified. Musculoskeletal: Lumbar spondylosis. No aggressive lytic or sclerotic bone lesions. Review of the MIP images confirms the above findings. IMPRESSION: 1. Persistent but  improved appearance of bilateral pulmonary artery emboli. No new clot burden identified at this time. 2. Persistent bilateral pleural effusions and small pericardial effusion. 3. Worsening airspace consolidation to both lungs. There is now complete opacification of both lower lobes. 4. No acute findings identified within the abdomen or pelvis. 5. These results were called by telephone at the time of interpretation on 02/12/2019 at 10:47 am to provider [facility], who verbally acknowledged these results. Aortic Atherosclerosis (ICD10-I70.0). Electronically Signed   By: Kerby Moors M.D.   On: 02/05/2019 10:48   CT HEAD WO CONTRAST  Result Date: 02/16/2019 CLINICAL DATA:  Nose bleed.  Patient on tPA. EXAM: CT HEAD WITHOUT CONTRAST TECHNIQUE: Contiguous axial images were obtained from the base of the skull through the vertex without intravenous contrast. COMPARISON:  01/22/2019 FINDINGS: Brain: Age related volume loss. No evidence of old or acute focal infarction, mass lesion, hemorrhage, hydrocephalus or extra-axial collection Vascular: No abnormal intracranial vascular finding. Skull: Normal Sinuses/Orbits: Packing material present within the nasal passages on the right. Paranasal sinuses are pacified with relatively high-density material consistent with hemorrhage related to nose bleed. No specific finding. Patient also has bilateral mastoid effusions more extensive on the right than the left with fluid in both middle ears right more than left. Orbits are negative. Other: Nasopharyngeal occlusion balloon in place. IMPRESSION: No acute or significant intracranial finding. Packing of the nasal passages on the right. Opacification of the paranasal sinuses with high-density material consistent with acute bleeding. Bilateral mastoid effusions. Electronically Signed   By: Nelson Chimes M.D.   On: 01/26/2019 10:44   CT ANGIO CHEST PE W OR WO CONTRAST  Result Date: 02/11/2019 CLINICAL DATA:  High pretest  probability for acute pulmonary embolus. EXAM: CT ANGIOGRAPHY CHEST CT ABDOMEN AND PELVIS WITH CONTRAST TECHNIQUE: Multidetector CT imaging of the chest was performed using the standard protocol during bolus administration of intravenous contrast. Multiplanar CT image reconstructions and MIPs were obtained to evaluate the vascular anatomy. Multidetector CT imaging of the abdomen and pelvis was performed using the standard protocol during bolus administration of intravenous contrast. CONTRAST:  21m OMNIPAQUE  IOHEXOL 350 MG/ML SOLN COMPARISON:  None. FINDINGS: CTA CHEST FINDINGS Cardiovascular: Large clot within the right main pulmonary artery is again identified. Similar to previous exam. Bilateral lobar and segmental pulmonary artery filling defects are also noted which appear improved in the interval. No new clot burden identified. Increased small pericardial effusion. Mediastinum/Nodes: ETT tip is above the carina. Nasogastric tube tip is in the proximal stomach. No mediastinal or hilar adenopathy. Lungs/Pleura: Bilateral pleural effusions are again noted. Worsening airspace consolidation to both lower lobes. The upper lobes and right middle lobe remain relatively well aerated. Patchy area of airspace consolidation in the lingula is noted, new from previous study. Musculoskeletal: Thoracic spondylosis. Review of the MIP images confirms the above findings. CT ABDOMEN and PELVIS FINDINGS Hepatobiliary: Diminished exam detail within the abdomen and pelvis secondary to patient body habitus, artifact from overlying support apparatus, and sub optimal opacification of solid organs. There is vicarious excretion of contrast into the gallbladder. No suspicious liver abnormality identified. Pancreas: Unremarkable. No pancreatic ductal dilatation or surrounding inflammatory changes. Spleen: Normal in size without focal abnormality. Adrenals/Urinary Tract: Normal appearance of the adrenal glands. No hydronephrosis identified  bilaterally. Exophytic lesion arising from lateral cortex of upper pole of right kidney measures 1.4 cm. This is favored to represent a small cyst. No mass or hydronephrosis noted bilaterally. Urinary bladder is partially collapsed around a Foley catheter. Stomach/Bowel: Status post gastric banding. Stomach is otherwise unremarkable. No small bowel dilatation. A lipoma is noted within the right colon measuring 1.5 cm. No bowel wall thickening or inflammation identified. Vascular/Lymphatic: Aortic atherosclerosis. No aneurysm. Large bore right femoral central venous catheter is identified with tip terminating at the cavoatrial junction. Just above the bifurcation of the abdominal aorta, there is a left periaortic node measuring 1.6 cm, image 63/6. Unchanged from CT 12/02/2016. Reproductive: Prostate is unremarkable. Other: No free fluid or fluid collections. Diffuse body wall edema is identified. Musculoskeletal: Lumbar spondylosis. No aggressive lytic or sclerotic bone lesions. Review of the MIP images confirms the above findings. IMPRESSION: 1. Persistent but improved appearance of bilateral pulmonary artery emboli. No new clot burden identified at this time. 2. Persistent bilateral pleural effusions and small pericardial effusion. 3. Worsening airspace consolidation to both lungs. There is now complete opacification of both lower lobes. 4. No acute findings identified within the abdomen or pelvis. 5. These results were called by telephone at the time of interpretation on 02/07/2019 at 10:47 am to provider [facility], who verbally acknowledged these results. Aortic Atherosclerosis (ICD10-I70.0). Electronically Signed   By: Kerby Moors M.D.   On: 02/07/2019 10:48     Medications:     Scheduled Medications: . sodium chloride   Intravenous Once  . bisacodyl  10 mg Oral Daily   Or  . bisacodyl  10 mg Rectal Daily  . chlorhexidine gluconate (MEDLINE KIT)  15 mL Mouth Rinse BID  . Chlorhexidine  Gluconate Cloth  6 each Topical Daily  . docusate sodium  200 mg Oral Daily  . feeding supplement (PRO-STAT SUGAR FREE 64)  30 mL Per Tube TID  . insulin aspart  0-20 Units Subcutaneous Q4H  . insulin glargine  12 Units Subcutaneous Q24H  . mouth rinse  15 mL Mouth Rinse 10 times per day  . pantoprazole (PROTONIX) IV  40 mg Intravenous Q12H  . sildenafil  20 mg Oral TID  . sodium chloride flush  3 mL Intravenous Q12H    Infusions: . sodium chloride 10 mL/hr at 02/07/2019 0800  .  sodium chloride 20 mL/hr at 01/31/19 0400  . angiotensin II (GIAPREZA) infusion    . ceFEPime (MAXIPIME) IV 2 g (01/31/19 0211)  . feeding supplement (VITAL 1.5 CAL) 1,000 mL (01/31/19 0537)  . fentaNYL infusion INTRAVENOUS 200 mcg/hr (01/31/19 0400)  . furosemide (LASIX) infusion 8 mg/hr (01/31/19 0400)  . heparin 1,450 Units/hr (01/31/19 0400)  . midazolam 0.5 mg/hr (01/31/19 0400)  . norepinephrine (LEVOPHED) Adult infusion 50 mcg/min (01/31/19 0509)    PRN Medications: sodium chloride, calcium chloride, dextrose, fentaNYL, fentaNYL, metoprolol tartrate, midazolam, midazolam, morphine injection, ondansetron (ZOFRAN) IV, oxyCODONE, sodium chloride flush, traMADol   Assessment/Plan   1.  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  VA ECMO initiated 12/8.  Currently good flow (4-5 L/min range) at 3600 rpm.  Still with significant pressor requirement despite catheter-directed tPA x 2 now.  He remains on norepinephrine 40, off vasopressin and epinephrine.  TEE from 12/14 showed EF 65% with RV function near normal.  Flow decreased to 1 L/min transiently and tolerated, left at 2.5 L/min overnight but had wide pulse pressure with low MAP and had to increase flow back to 4.5 L/min.  Suspect vasodilation/vasoplegia, SVR 295 this morning co-ox 79%.  Now on Lasix gtt at 8 mg/hr with mildly negative I/Os and CVP 9.  Creatinine mildly higher 2.4 after contrast load with CTA yesterday.   - Continue norepinephrine.  - With low SVR/vasoplegia, will add Giapreza today.  Will then try to wean ECMO flow slowly, ?able to decannulate tomorrow given improved RV function.    - Trial of sildenafil 20 mg tid today, wean NO to off.     - Continue Lasix gtt 8 mg/hr for now.  2. AKI: Creatinine mildly higher at 2.4, follow closely.  May be contrast nephropathy with CTA yesterday. 3. Acute hypoxic/hypoxemic respiratory failure: FiO2 0.4, improved ABG on ECMO.  4. ID: WBCs elevated but afebrile.  No PNA on CXR.  PCT not elevated initially.  Blood and trach aspirate cultures so far negative.  - Empiric coverage with cefepime .  5. Pulmonary embolus: Massive bilateral PE with left leg DVT.  He got 150 mg IV total tPA initially, then catheter directed tPA to both PAs on 12/9 and again on 12/10. CTA 12/14 showed some improvement but still with high clot burden in right PA.  - Continue to review with cardiac surgeons/CCM => Will try weaning ECMO today with Giapreza.  If fails, consider IR-guided thrombectomy (Dr. Jacqualyn Posey with IR aware of patient).   - Continue heparin gtt, level therapeutic.  6. ENT: ENT bleeding, has had nasal packing by ENT, resolved.  7. Acute blood loss anemia: ENT bleeding initially but this has slowed.  Not certain of current source of blood loss, no overt GI bleeding and LDH is stable.  Transfuse hgb < 8. He had 1 more unit PRBCs last night.   - Continue IV Protonix.  8. Thrombocytopenia: Plts 90 K this morning, improving.  Suspect due to critical illness.  - Transfuse plts < 50K.  9. FEN: Continue TFs.   10. Neuro: Lighten sedation to see if we can wake up this morning.   CRITICAL CARE Performed by: Loralie Champagne  Total critical care time: 45 minutes  Critical care time was exclusive of separately billable procedures and treating other patients.  Critical care was necessary to treat or prevent imminent or life-threatening deterioration.  Critical care was time spent  personally by me on the  following activities: development of treatment plan with patient and/or surrogate as well as nursing, discussions with consultants, evaluation of patient's response to treatment, examination of patient, obtaining history from patient or surrogate, ordering and performing treatments and interventions, ordering and review of laboratory studies, ordering and review of radiographic studies, pulse oximetry and re-evaluation of patient's condition.   Length of Stay: 8  Loralie Champagne, MD  01/31/2019, 7:44 AM  Advanced Heart Failure Team Pager [phone] (M-F; 7a - 4p)  Please contact [facility] for night-coverage after hours (4p -7a ) and weekends on [email or web address]

## 2019-01-31 NOTE — Progress Notes (Signed)
Palliative:  I met again at Philip Wolfe's bedside. No family present today. No concerns from RN at bedside. Plans for decannulation tomorrow. This will be a longer progress to determine overall prognosis. I will continue [place] conversation with wife tomorrow.   No charge  Vinie Sill, NP Palliative Medicine Team Pager [phone] (Please see [email or web address] for schedule) Team Phone [phone]

## 2019-01-31 NOTE — Progress Notes (Signed)
ANTICOAGULATION CONSULT NOTE   Pharmacy Consult for Heparin  Indication: pulmonary embolus + ECMO  No Known Allergies  Patient Measurements: Height: 5\' 9"  (175.3 cm) Weight: (!) 330 lb 7.5 oz (149.9 kg) IBW/kg (Calculated) : 70.7 Heparin Dosing Weight: 103 kg  Vital Signs: Temp: 98.2 F (36.8 C) (12/15 1700) Temp Source: Bladder (12/15 1316) BP: 104/57 (12/15 0745) Pulse Rate: 95 (12/15 1700)  Labs: Recent Labs    01/29/19 0750 01/29/19 0954 01/21/2019 0455 01/26/2019 0510 02/09/2019 1607 01/26/2019 2044 01/31/19 0331 01/31/19 0332 01/31/19 1012 01/31/19 1205 01/31/19 1450 01/31/19 1634 01/31/19 1635 01/31/19 1711  HGB  --   --  8.3*  --  8.4* 8.4* 7.9*  --  7.9* 7.8*  --  8.4*  --  7.1*  HCT  --   --  24.4*  --  25.2* 24.7* 23.6*  --  24.3* 23.0*  --  26.0*  --  21.0*  PLT 60*  --  72*   < > 80* 81* 90*  --  81*  --   --  79*  --   --   APTT 78*  --  90*  --   --   --  84*  --   --   --   --   --   --   --   LABPROT 16.4*  --  16.9*  --   --  16.5* 16.9*  --   --   --   --   --   --   --   INR 1.3*  --  1.4*  --   --  1.3* 1.4*  --   --   --   --   --   --   --   HEPARINUNFRC  --    < > 0.39  --  0.41  --   --  0.35  --   --   --   --  0.30  --   CREATININE  --    < > 1.84*   < > 2.06*  --  2.42*  --   --   --  2.59* 2.66*  --   --    < > = values in this interval not displayed.    Estimated Creatinine Clearance: 39 mL/min (A) (by C-G formula based on SCr of 2.66 mg/dL (H)).  Assessment: 68 yr old male admitted with massive PE and RV failure. Pt received 150 mg total of systemic tPA with continued shock, so ECMO started 12/8. EKOS started along with IV heparin, then repeated 12/10 with significant clot burden. RV function improving on ECHO 12/11 - holding off further tPA/thrombectomy.  Heparin level drawn this afternoon ~13 hrs after last therapeutic heparin level (0.35 units/ml) was 0.30 units/ml, which is at the low end of the goal range for this pt. H/H this afternoon  at 16:34 PM were 8.4/26.0; H/H at 17:11 PM (after 1 unit transfused) decreased to 7.1/21.0; platelets stable at 79.  Goal of Therapy:  Heparin level 0.3-0.5 units/mL per MD (while on ECMO) Monitor platelets by anticoagulation protocol: Yes    Plan:  Due to drop in Hgb this afternoon, continue heparin at 1450 units/hr  Check heparin level at midnight Check heparin levels Q 12 hrs (0500/1700) Monitor for signs/symptoms of bleeding  Gillermina Hu, PharmD, BCPS, [facility] Clinical Pharmacist 01/31/2019 6:19 PM

## 2019-01-31 NOTE — Progress Notes (Addendum)
ANTICOAGULATION CONSULT NOTE   Pharmacy Consult for Heparin  Indication: pulmonary embolus + ECMO  No Known Allergies  Patient Measurements: Height: 5\' 9"  (175.3 cm) Weight: (!) 330 lb 7.5 oz (149.9 kg) IBW/kg (Calculated) : 70.7 Heparin Dosing Weight: 103 kg  Vital Signs: Temp: 97.9 F (36.6 C) (12/15 1300) Temp Source: Bladder (12/15 1300) BP: 104/57 (12/15 0745) Pulse Rate: 91 (12/15 1300)  Labs: Recent Labs    01/29/19 0750 01/29/19 0954 02/12/2019 0455 02/16/2019 0510 01/29/2019 1417 01/28/2019 1607 02/05/2019 2044 01/31/19 0331 01/31/19 0332 01/31/19 1012 01/31/19 1205  HGB  --   --  8.3*  --   --  8.4* 8.4* 7.9*  --  7.9* 7.8*  HCT  --   --  24.4*  --   --  25.2* 24.7* 23.6*  --  24.3* 23.0*  PLT 60*  --  72*   < >  --  80* 81* 90*  --  81*  --   APTT 78*  --  90*  --   --   --   --  84*  --   --   --   LABPROT 16.4*  --  16.9*  --   --   --  16.5* 16.9*  --   --   --   INR 1.3*  --  1.4*  --   --   --  1.3* 1.4*  --   --   --   HEPARINUNFRC  --    < > 0.39  --   --  0.41  --   --  0.35  --   --   CREATININE  --    < > 1.84*  --  2.11* 2.06*  --  2.42*  --   --   --    < > = values in this interval not displayed.    Estimated Creatinine Clearance: 42.9 mL/min (A) (by C-G formula based on SCr of 2.42 mg/dL (H)).  Assessment: 56 yoM admitted with massive PE and RV failure. Pt received 150mg  total of systemic tPA with continued shock so ECMO started 12/8. EKOS started along with IV heparin, then repeated 12/10 with significant clot burden. RV function improving on ECHO 12/11 - holding off further tPA/thrombectomy  Heparin level remains therapeutic at 0.35 units/mL. Hgb stable 7.8, plt 81. No overt bleeding noted.    Goal of Therapy:  Heparin level 0.3-0.5 units/mL per MD (while on ECMO) Monitor platelets by anticoagulation protocol: Yes    Plan:  -Continue heparin 1450 units/hr -Check heparin level (0500/1700) -Monitor for bleeding  Erin Hearing PharmD.,  BCPS Clinical Pharmacist 01/31/2019 1:07 PM

## 2019-01-31 NOTE — Progress Notes (Signed)
Nutrition Follow up  DOCUMENTATION CODES:   Morbid obesity  INTERVENTION:   No BM since Sunday- regimen in place  Increase tube feeding:  -Vital 1.5 @ 45 ml/hr (1080 ml) via OGT -60 ml Prostat TID  Provides: 2220 kcals, 163 grams protein, 825 ml free water. Meets 100% of needs.   NUTRITION DIAGNOSIS:   Inadequate oral intake related to inability to eat as evidenced by NPO status.  Ongoing  GOAL:   Patient will meet greater than or equal to 90% of their needs   Not meeting   MONITOR:   Vent status, Labs, Weight trends, TF tolerance, Skin, I & O's  REASON FOR ASSESSMENT:   Consult, Ventilator Enteral/tube feeding initiation and management  ASSESSMENT:   68 y.o. male with PMH of Arthritis, Hypertension, Sleep apnea, s/p gastric bypass. Presented to ED with progressive SOB. O2 sats in 60s, in respiratory distress on BiPAP in ED, intubated by PCCM. On CT pulmonary emboli identified, and right heart strain noted. Pt has passed several large blood clots from nose and mouth, has significant clot burden per NP.   12/8- s/p IR EKOS cath placement, cannulation for VA ECMO,  ENT following for epistaxis.  12/11- Cortrak attempt unsuccessful, OGT placed by CMM- TF started  12/14- s/p TEE showed EF 65% with RV function near normal  RD working remotely.  Continues on ECMO (day 7). Possible decannulation tomorrow? Requiring levophed. Cr trending up- UOP good. Tolerating Vital 1.5 @ 30 ml/hr via OGT. Plan increase to goal rate today. Per RN, no BM since Sunday. Nasal packing remains in place. May consider transition to Cortrak once packing removed.   Admission weight: 137 kg  Current weight: 149.9 kg- down 4 kg over the last week   Patient remains intubated on ventilator support MV: 10.7 L/min Temp (24hrs), Avg:98 F (36.7 C), Min:97.7 F (36.5 C), Max:98.4 F (36.9 C)    I/O: +10,595 ml since admit UOP: 4,895 ml x 24 hrs   Drips: 250 mg lasix in D5 @ 8 ml/hr, levophed,  versed, fentanyl Medications: dulcolax, colace, SS novolog, lantus Labs: Mg 2.5 (H) Cr 2.42-trending up CBG 123-199  Diet Order:   Diet Order            Diet NPO time specified  Diet effective now              EDUCATION NEEDS:   Not appropriate for education at this time  Skin:  Skin Assessment: Skin Integrity Issues: Skin Integrity Issues:: Incisions Incisions: R chest, R groin  Last BM:  PTA  Height:   Ht Readings from Last 1 Encounters:  01/28/19 5\' 9"  (1.753 m)     Weight:   Wt Readings from Last 1 Encounters:  01/31/19 (!) 149.9 kg    Ideal Body Weight:  72.7 kg  BMI:  Body mass index is 48.8 kg/m.  Estimated Nutritional Needs:   Kcal:  2237 kcal  Protein:  155-185 grams  Fluid:  >/= 1.6 L  Mariana Single RD, LDN Clinical Nutrition Pager # [phone]

## 2019-02-01 ENCOUNTER — Inpatient Hospital Stay (HOSPITAL_COMMUNITY): Payer: BC Managed Care – PPO | Admitting: Certified Registered Nurse Anesthetist

## 2019-02-01 ENCOUNTER — Inpatient Hospital Stay (HOSPITAL_COMMUNITY): Payer: BC Managed Care – PPO

## 2019-02-01 ENCOUNTER — Inpatient Hospital Stay (HOSPITAL_COMMUNITY): Admission: EM | Disposition: E | Payer: Self-pay | Source: Home / Self Care | Attending: Internal Medicine

## 2019-02-01 ENCOUNTER — Encounter (HOSPITAL_COMMUNITY): Payer: Self-pay | Admitting: Pulmonary Disease

## 2019-02-01 DIAGNOSIS — I2699 Other pulmonary embolism without acute cor pulmonale: Secondary | ICD-10-CM

## 2019-02-01 DIAGNOSIS — L8990 Pressure ulcer of unspecified site, unspecified stage: Secondary | ICD-10-CM | POA: Insufficient documentation

## 2019-02-01 DIAGNOSIS — N179 Acute kidney failure, unspecified: Secondary | ICD-10-CM

## 2019-02-01 DIAGNOSIS — Z4589 Encounter for adjustment and management of other implanted devices: Secondary | ICD-10-CM

## 2019-02-01 DIAGNOSIS — J90 Pleural effusion, not elsewhere classified: Secondary | ICD-10-CM

## 2019-02-01 HISTORY — PX: CANNULATION FOR ECMO (EXTRACORPOREAL MEMBRANE OXYGENATION): SHX6796

## 2019-02-01 HISTORY — PX: CHEST TUBE INSERTION: SHX231

## 2019-02-01 HISTORY — PX: TEE WITHOUT CARDIOVERSION: SHX5443

## 2019-02-01 LAB — POCT I-STAT 7, (LYTES, BLD GAS, ICA,H+H)
Acid-Base Excess: 3 mmol/L — ABNORMAL HIGH (ref 0.0–2.0)
Acid-Base Excess: 1 mmol/L (ref 0.0–2.0)
Acid-Base Excess: 1 mmol/L (ref 0.0–2.0)
Acid-Base Excess: 1 mmol/L (ref 0.0–2.0)
Acid-Base Excess: 1 mmol/L (ref 0.0–2.0)
Acid-Base Excess: 2 mmol/L (ref 0.0–2.0)
Bicarbonate: 27.7 mmol/L (ref 20.0–28.0)
Bicarbonate: 25 mmol/L (ref 20.0–28.0)
Bicarbonate: 25.5 mmol/L (ref 20.0–28.0)
Bicarbonate: 25.7 mmol/L (ref 20.0–28.0)
Bicarbonate: 26.4 mmol/L (ref 20.0–28.0)
Bicarbonate: 27.9 mmol/L (ref 20.0–28.0)
Bicarbonate: 26.4 mmol/L (ref 20.0–28.0)
Calcium, Ion: 1.15 mmol/L (ref 1.15–1.40)
Calcium, Ion: 1.08 mmol/L — ABNORMAL LOW (ref 1.15–1.40)
Calcium, Ion: 1.1 mmol/L — ABNORMAL LOW (ref 1.15–1.40)
Calcium, Ion: 1.08 mmol/L — ABNORMAL LOW (ref 1.15–1.40)
Calcium, Ion: 1.1 mmol/L — ABNORMAL LOW (ref 1.15–1.40)
Calcium, Ion: 1.1 mmol/L — ABNORMAL LOW (ref 1.15–1.40)
Calcium, Ion: 1.08 mmol/L — ABNORMAL LOW (ref 1.15–1.40)
HCT: 20 % — ABNORMAL LOW (ref 39.0–52.0)
HCT: 20 % — ABNORMAL LOW (ref 39.0–52.0)
HCT: 23 % — ABNORMAL LOW (ref 39.0–52.0)
HCT: 23 % — ABNORMAL LOW (ref 39.0–52.0)
HCT: 25 % — ABNORMAL LOW (ref 39.0–52.0)
HCT: 25 % — ABNORMAL LOW (ref 39.0–52.0)
HCT: 25 % — ABNORMAL LOW (ref 39.0–52.0)
Hemoglobin: 6.8 g/dL — CL (ref 13.0–17.0)
Hemoglobin: 6.8 g/dL — CL (ref 13.0–17.0)
Hemoglobin: 7.8 g/dL — ABNORMAL LOW (ref 13.0–17.0)
Hemoglobin: 7.8 g/dL — ABNORMAL LOW (ref 13.0–17.0)
Hemoglobin: 8.5 g/dL — ABNORMAL LOW (ref 13.0–17.0)
Hemoglobin: 8.5 g/dL — ABNORMAL LOW (ref 13.0–17.0)
Hemoglobin: 8.5 g/dL — ABNORMAL LOW (ref 13.0–17.0)
O2 Saturation: 97 %
O2 Saturation: 96 %
O2 Saturation: 91 %
O2 Saturation: 92 %
O2 Saturation: 99 %
O2 Saturation: 94 %
O2 Saturation: 100 %
Patient temperature: 37
Patient temperature: 37
Patient temperature: 36.3
Patient temperature: 36.3
Patient temperature: 36.3
Patient temperature: 36.5
Patient temperature: 37.1
Potassium: 4 mmol/L (ref 3.5–5.1)
Potassium: 3.8 mmol/L (ref 3.5–5.1)
Potassium: 4 mmol/L (ref 3.5–5.1)
Potassium: 4.3 mmol/L (ref 3.5–5.1)
Potassium: 4.3 mmol/L (ref 3.5–5.1)
Potassium: 4.4 mmol/L (ref 3.5–5.1)
Potassium: 4.3 mmol/L (ref 3.5–5.1)
Sodium: 145 mmol/L (ref 135–145)
Sodium: 148 mmol/L — ABNORMAL HIGH (ref 135–145)
Sodium: 148 mmol/L — ABNORMAL HIGH (ref 135–145)
Sodium: 146 mmol/L — ABNORMAL HIGH (ref 135–145)
Sodium: 148 mmol/L — ABNORMAL HIGH (ref 135–145)
Sodium: 148 mmol/L — ABNORMAL HIGH (ref 135–145)
Sodium: 147 mmol/L — ABNORMAL HIGH (ref 135–145)
TCO2: 29 mmol/L (ref 22–32)
TCO2: 26 mmol/L (ref 22–32)
TCO2: 27 mmol/L (ref 22–32)
TCO2: 27 mmol/L (ref 22–32)
TCO2: 28 mmol/L (ref 22–32)
TCO2: 30 mmol/L (ref 22–32)
TCO2: 28 mmol/L (ref 22–32)
pCO2 arterial: 40.4 mmHg (ref 32.0–48.0)
pCO2 arterial: 37 mmHg (ref 32.0–48.0)
pCO2 arterial: 36.2 mmHg (ref 32.0–48.0)
pCO2 arterial: 43.4 mmHg (ref 32.0–48.0)
pCO2 arterial: 45.2 mmHg (ref 32.0–48.0)
pCO2 arterial: 53.7 mmHg — ABNORMAL HIGH (ref 32.0–48.0)
pCO2 arterial: 39.7 mmHg (ref 32.0–48.0)
pH, Arterial: 7.444 (ref 7.350–7.450)
pH, Arterial: 7.437 (ref 7.350–7.450)
pH, Arterial: 7.452 — ABNORMAL HIGH (ref 7.350–7.450)
pH, Arterial: 7.36 (ref 7.350–7.450)
pH, Arterial: 7.389 (ref 7.350–7.450)
pH, Arterial: 7.321 — ABNORMAL LOW (ref 7.350–7.450)
pH, Arterial: 7.432 (ref 7.350–7.450)
pO2, Arterial: 85 mmHg (ref 83.0–108.0)
pO2, Arterial: 81 mmHg — ABNORMAL LOW (ref 83.0–108.0)
pO2, Arterial: 56 mmHg — ABNORMAL LOW (ref 83.0–108.0)
pO2, Arterial: 121 mmHg — ABNORMAL HIGH (ref 83.0–108.0)
pO2, Arterial: 66 mmHg — ABNORMAL LOW (ref 83.0–108.0)
pO2, Arterial: 76 mmHg — ABNORMAL LOW (ref 83.0–108.0)
pO2, Arterial: 246 mmHg — ABNORMAL HIGH (ref 83.0–108.0)

## 2019-02-01 LAB — COMPREHENSIVE METABOLIC PANEL
ALT: 46 U/L — ABNORMAL HIGH (ref 0–44)
ALT: 42 U/L (ref 0–44)
AST: 102 U/L — ABNORMAL HIGH (ref 15–41)
AST: 93 U/L — ABNORMAL HIGH (ref 15–41)
Albumin: 2 g/dL — ABNORMAL LOW (ref 3.5–5.0)
Albumin: 2.2 g/dL — ABNORMAL LOW (ref 3.5–5.0)
Alkaline Phosphatase: 102 U/L (ref 38–126)
Alkaline Phosphatase: 96 U/L (ref 38–126)
Anion gap: 14 (ref 5–15)
Anion gap: 13 (ref 5–15)
BUN: 89 mg/dL — ABNORMAL HIGH (ref 8–23)
BUN: 96 mg/dL — ABNORMAL HIGH (ref 8–23)
CO2: 25 mmol/L (ref 22–32)
CO2: 24 mmol/L (ref 22–32)
Calcium: 7.7 mg/dL — ABNORMAL LOW (ref 8.9–10.3)
Calcium: 7.8 mg/dL — ABNORMAL LOW (ref 8.9–10.3)
Chloride: 105 mmol/L (ref 98–111)
Chloride: 112 mmol/L — ABNORMAL HIGH (ref 98–111)
Creatinine, Ser: 2.75 mg/dL — ABNORMAL HIGH (ref 0.61–1.24)
Creatinine, Ser: 2.92 mg/dL — ABNORMAL HIGH (ref 0.61–1.24)
GFR calc Af Amer: 26 mL/min — ABNORMAL LOW (ref >60)
GFR calc Af Amer: 25 mL/min — ABNORMAL LOW (ref >60)
GFR calc non Af Amer: 23 mL/min — ABNORMAL LOW (ref >60)
GFR calc non Af Amer: 21 mL/min — ABNORMAL LOW (ref >60)
Glucose, Bld: 259 mg/dL — ABNORMAL HIGH (ref 70–99)
Glucose, Bld: 142 mg/dL — ABNORMAL HIGH (ref 70–99)
Potassium: 4.1 mmol/L (ref 3.5–5.1)
Potassium: 4.4 mmol/L (ref 3.5–5.1)
Sodium: 144 mmol/L (ref 135–145)
Sodium: 149 mmol/L — ABNORMAL HIGH (ref 135–145)
Total Bilirubin: 1.5 mg/dL — ABNORMAL HIGH (ref 0.3–1.2)
Total Bilirubin: 2.3 mg/dL — ABNORMAL HIGH (ref 0.3–1.2)
Total Protein: 4.5 g/dL — ABNORMAL LOW (ref 6.5–8.1)
Total Protein: 4.7 g/dL — ABNORMAL LOW (ref 6.5–8.1)

## 2019-02-01 LAB — CBC
HCT: 26 % — ABNORMAL LOW (ref 39.0–52.0)
HCT: 24.6 % — ABNORMAL LOW (ref 39.0–52.0)
HCT: 27.2 % — ABNORMAL LOW (ref 39.0–52.0)
HCT: 27.4 % — ABNORMAL LOW (ref 39.0–52.0)
Hemoglobin: 8.5 g/dL — ABNORMAL LOW (ref 13.0–17.0)
Hemoglobin: 8 g/dL — ABNORMAL LOW (ref 13.0–17.0)
Hemoglobin: 8.6 g/dL — ABNORMAL LOW (ref 13.0–17.0)
Hemoglobin: 8.9 g/dL — ABNORMAL LOW (ref 13.0–17.0)
MCH: 30.9 pg (ref 26.0–34.0)
MCH: 31.1 pg (ref 26.0–34.0)
MCH: 30.5 pg (ref 26.0–34.0)
MCH: 31.1 pg (ref 26.0–34.0)
MCHC: 32.7 g/dL (ref 30.0–36.0)
MCHC: 32.5 g/dL (ref 30.0–36.0)
MCHC: 31.6 g/dL (ref 30.0–36.0)
MCHC: 32.5 g/dL (ref 30.0–36.0)
MCV: 94.5 fL (ref 80.0–100.0)
MCV: 95.7 fL (ref 80.0–100.0)
MCV: 96.5 fL (ref 80.0–100.0)
MCV: 95.8 fL (ref 80.0–100.0)
Platelets: 96 10*3/uL — ABNORMAL LOW (ref 150–400)
Platelets: 97 10*3/uL — ABNORMAL LOW (ref 150–400)
Platelets: 96 10*3/uL — ABNORMAL LOW (ref 150–400)
Platelets: 115 10*3/uL — ABNORMAL LOW (ref 150–400)
RBC: 2.75 MIL/uL — ABNORMAL LOW (ref 4.22–5.81)
RBC: 2.57 MIL/uL — ABNORMAL LOW (ref 4.22–5.81)
RBC: 2.82 MIL/uL — ABNORMAL LOW (ref 4.22–5.81)
RBC: 2.86 MIL/uL — ABNORMAL LOW (ref 4.22–5.81)
RDW: 17.3 % — ABNORMAL HIGH (ref 11.5–15.5)
RDW: 17.6 % — ABNORMAL HIGH (ref 11.5–15.5)
RDW: 16.6 % — ABNORMAL HIGH (ref 11.5–15.5)
RDW: 17 % — ABNORMAL HIGH (ref 11.5–15.5)
WBC: 19 10*3/uL — ABNORMAL HIGH (ref 4.0–10.5)
WBC: 18.6 10*3/uL — ABNORMAL HIGH (ref 4.0–10.5)
WBC: 20.2 10*3/uL — ABNORMAL HIGH (ref 4.0–10.5)
WBC: 21.5 10*3/uL — ABNORMAL HIGH (ref 4.0–10.5)
nRBC: 5.5 % — ABNORMAL HIGH (ref 0.0–0.2)
nRBC: 4 % — ABNORMAL HIGH (ref 0.0–0.2)
nRBC: 3 % — ABNORMAL HIGH (ref 0.0–0.2)
nRBC: 3.5 % — ABNORMAL HIGH (ref 0.0–0.2)

## 2019-02-01 LAB — PROTIME-INR
INR: 1.3 — ABNORMAL HIGH (ref 0.8–1.2)
INR: 1.4 — ABNORMAL HIGH (ref 0.8–1.2)
Prothrombin Time: 16.4 seconds — ABNORMAL HIGH (ref 11.4–15.2)
Prothrombin Time: 16.7 seconds — ABNORMAL HIGH (ref 11.4–15.2)

## 2019-02-01 LAB — LACTATE DEHYDROGENASE
LDH: 400 U/L — ABNORMAL HIGH (ref 98–192)
LDH: 385 U/L — ABNORMAL HIGH (ref 98–192)

## 2019-02-01 LAB — GLUCOSE, CAPILLARY
Glucose-Capillary: 105 mg/dL — ABNORMAL HIGH (ref 70–99)
Glucose-Capillary: 140 mg/dL — ABNORMAL HIGH (ref 70–99)
Glucose-Capillary: 143 mg/dL — ABNORMAL HIGH (ref 70–99)
Glucose-Capillary: 158 mg/dL — ABNORMAL HIGH (ref 70–99)
Glucose-Capillary: 173 mg/dL — ABNORMAL HIGH (ref 70–99)
Glucose-Capillary: 181 mg/dL — ABNORMAL HIGH (ref 70–99)

## 2019-02-01 LAB — COOXEMETRY PANEL
Carboxyhemoglobin: 2.1 % — ABNORMAL HIGH (ref 0.5–1.5)
Methemoglobin: 1.2 % (ref 0.0–1.5)
O2 Saturation: 77.7 %
Total hemoglobin: 11.8 g/dL — ABNORMAL LOW (ref 12.0–16.0)

## 2019-02-01 LAB — HEPARIN LEVEL (UNFRACTIONATED)
Heparin Unfractionated: 0.24 IU/mL — ABNORMAL LOW (ref 0.30–0.70)
Heparin Unfractionated: 0.27 IU/mL — ABNORMAL LOW (ref 0.30–0.70)
Heparin Unfractionated: 0.34 IU/mL (ref 0.30–0.70)

## 2019-02-01 LAB — APTT
aPTT: 62 seconds — ABNORMAL HIGH (ref 24–36)
aPTT: 57 seconds — ABNORMAL HIGH (ref 24–36)

## 2019-02-01 LAB — CULTURE, RESPIRATORY W GRAM STAIN: Culture: NORMAL

## 2019-02-01 LAB — PHOSPHORUS: Phosphorus: 6.6 mg/dL — ABNORMAL HIGH (ref 2.5–4.6)

## 2019-02-01 LAB — LACTIC ACID, PLASMA: Lactic Acid, Venous: 1.5 mmol/L (ref 0.5–1.9)

## 2019-02-01 LAB — PREPARE RBC (CROSSMATCH)

## 2019-02-01 LAB — MAGNESIUM: Magnesium: 2.3 mg/dL (ref 1.7–2.4)

## 2019-02-01 SURGERY — CANNULATION FOR ECMO (EXTRACORPOREAL MEMBRANE OXYGENATION)
Anesthesia: General

## 2019-02-01 MED ORDER — ALBUMIN HUMAN 5 % IV SOLN
12.5000 g | Freq: Once | INTRAVENOUS | Status: AC
  Administered 2019-02-01: 12.5 g via INTRAVENOUS

## 2019-02-01 MED ORDER — FENTANYL CITRATE (PF) 250 MCG/5ML IJ SOLN
INTRAMUSCULAR | Status: DC | PRN
  Administered 2019-02-01: 100 ug via INTRAVENOUS
  Administered 2019-02-01: 50 ug via INTRAVENOUS

## 2019-02-01 MED ORDER — MIDAZOLAM HCL 2 MG/2ML IJ SOLN
INTRAMUSCULAR | Status: AC
  Filled 2019-02-01: qty 2

## 2019-02-01 MED ORDER — SODIUM CHLORIDE 0.9% IV SOLUTION: Freq: Once | INTRAVENOUS | Status: DC

## 2019-02-01 MED ORDER — MIDAZOLAM HCL 5 MG/5ML IJ SOLN
INTRAMUSCULAR | Status: DC | PRN
  Administered 2019-02-01: 2 mg via INTRAVENOUS

## 2019-02-01 MED ORDER — HEPARIN SODIUM (PORCINE) 1000 UNIT/ML IJ SOLN
INTRAMUSCULAR | Status: DC | PRN
  Administered 2019-02-01: 5000 [IU] via INTRAVENOUS

## 2019-02-01 MED ORDER — MIDAZOLAM 50MG/50ML (1MG/ML) PREMIX INFUSION
0.5000 mg/h | INTRAVENOUS | Status: DC
  Administered 2019-02-01: 3 mg/h via INTRAVENOUS
  Administered 2019-02-02: 2 mg/h via INTRAVENOUS
  Administered 2019-02-02 – 2019-02-03 (×2): 4 mg/h via INTRAVENOUS
  Filled 2019-02-01 (×3): qty 50

## 2019-02-01 MED ORDER — PRISMASOL BGK 4/2.5 32-4-2.5 MEQ/L IV SOLN: INTRAVENOUS | Status: DC

## 2019-02-01 MED ORDER — LACTATED RINGERS IV SOLN: INTRAVENOUS | Status: DC | PRN

## 2019-02-01 MED ORDER — SODIUM CHLORIDE 0.9 % IV SOLN
2.0000 g | Freq: Two times a day (BID) | INTRAVENOUS | Status: DC
  Administered 2019-02-01 – 2019-02-02 (×3): 2 g via INTRAVENOUS
  Filled 2019-02-01 (×5): qty 2

## 2019-02-01 MED ORDER — FENTANYL CITRATE (PF) 250 MCG/5ML IJ SOLN
INTRAMUSCULAR | Status: AC
  Filled 2019-02-01: qty 5

## 2019-02-01 MED ORDER — PRISMASOL BGK 4/2.5 32-4-2.5 MEQ/L REPLACEMENT SOLN: Status: DC

## 2019-02-01 MED ORDER — SUGAMMADEX SODIUM 200 MG/2ML IV SOLN
INTRAVENOUS | Status: DC | PRN
  Administered 2019-02-01: 400 mg via INTRAVENOUS

## 2019-02-01 MED ORDER — SODIUM CHLORIDE 0.9 % IV SOLN: INTRAVENOUS | Status: DC | PRN

## 2019-02-01 MED ORDER — ROCURONIUM BROMIDE 10 MG/ML (PF) SYRINGE
PREFILLED_SYRINGE | INTRAVENOUS | Status: DC | PRN
  Administered 2019-02-01: 50 mg via INTRAVENOUS
  Administered 2019-02-01: 60 mg via INTRAVENOUS
  Administered 2019-02-01: 50 mg via INTRAVENOUS
  Administered 2019-02-01: 40 mg via INTRAVENOUS

## 2019-02-01 MED ORDER — SILDENAFIL CITRATE 20 MG PO TABS
20.0000 mg | ORAL_TABLET | Freq: Three times a day (TID) | ORAL | Status: DC
  Administered 2019-02-02 – 2019-02-03 (×4): 20 mg
  Filled 2019-02-01 (×4): qty 1

## 2019-02-01 MED ORDER — HEPARIN SODIUM (PORCINE) 1000 UNIT/ML DIALYSIS
1000.0000 [IU] | INTRAMUSCULAR | Status: DC | PRN
  Filled 2019-02-01: qty 6

## 2019-02-01 MED ORDER — ROCURONIUM BROMIDE 10 MG/ML (PF) SYRINGE
PREFILLED_SYRINGE | INTRAVENOUS | Status: AC
  Filled 2019-02-01: qty 10

## 2019-02-01 MED ORDER — PROPOFOL 10 MG/ML IV BOLUS
INTRAVENOUS | Status: AC
  Filled 2019-02-01: qty 20

## 2019-02-01 MED ORDER — SODIUM CHLORIDE 0.9 % IV SOLN
INTRAVENOUS | Status: DC | PRN
  Administered 2019-02-01: 500 mL

## 2019-02-01 MED ORDER — 0.9 % SODIUM CHLORIDE (POUR BTL) OPTIME
TOPICAL | Status: DC | PRN
  Administered 2019-02-01: 5000 mL

## 2019-02-01 MED ORDER — ALBUMIN HUMAN 5 % IV SOLN: INTRAVENOUS | Status: DC | PRN

## 2019-02-01 MED ORDER — HEMOSTATIC AGENTS (NO CHARGE) OPTIME
TOPICAL | Status: DC | PRN
  Administered 2019-02-01: 1 via TOPICAL

## 2019-02-01 MED ORDER — INSULIN GLARGINE 100 UNIT/ML ~~LOC~~ SOLN
14.0000 [IU] | SUBCUTANEOUS | Status: DC
  Administered 2019-02-01 – 2019-02-03 (×3): 14 [IU] via SUBCUTANEOUS
  Filled 2019-02-01 (×3): qty 0.14

## 2019-02-01 MED ORDER — NOREPINEPHRINE 16 MG/250ML-% IV SOLN
0.0000 ug/min | INTRAVENOUS | Status: DC
  Filled 2019-02-01: qty 250

## 2019-02-01 MED ORDER — SODIUM CHLORIDE 0.9 % IV SOLN
INTRAVENOUS | Status: AC
  Filled 2019-02-01: qty 1.2

## 2019-02-01 SURGICAL SUPPLY — 111 items
ADAPTER CARDIO PERF ANTE/RETRO (ADAPTER) ×3 IMPLANT
ADH SKN CLS APL DERMABOND .7 (GAUZE/BANDAGES/DRESSINGS)
ADPR PRFSN 84XANTGRD RTRGD (ADAPTER) ×2
ANCHOR CATH FOLEY SECURE (MISCELLANEOUS) ×1 IMPLANT
APL PRP STRL LF DISP 70% ISPRP (MISCELLANEOUS) ×2
BAG DECANTER FOR FLEXI CONT (MISCELLANEOUS) ×3 IMPLANT
BASKET HEART (ORDER IN 25'S) (MISCELLANEOUS)
BASKET HEART (ORDER IN 25S) (MISCELLANEOUS) ×2 IMPLANT
BIOPATCH BLUE 3/4IN DISK W/1.5 (GAUZE/BANDAGES/DRESSINGS) ×1 IMPLANT
BLADE CLIPPER SURG (BLADE) ×2 IMPLANT
BLADE STERNUM SYSTEM 6 (BLADE) ×2 IMPLANT
BNDG ELASTIC 4X5.8 VLCR STR LF (GAUZE/BANDAGES/DRESSINGS) ×2 IMPLANT
BNDG ELASTIC 6X5.8 VLCR STR LF (GAUZE/BANDAGES/DRESSINGS) ×2 IMPLANT
BNDG GAUZE ELAST 4 BULKY (GAUZE/BANDAGES/DRESSINGS) ×2 IMPLANT
CANISTER SUCT 3000ML PPV (MISCELLANEOUS) ×3 IMPLANT
CATH CPB KIT HENDRICKSON (MISCELLANEOUS) ×2 IMPLANT
CATH ROBINSON RED A/P 18FR (CATHETERS) ×4 IMPLANT
CATH THORACIC 28FR RT ANG (CATHETERS) ×2 IMPLANT
CATH TRIALYSIS 20CM 13F 3LUM (CATHETERS) ×1 IMPLANT
CHLORAPREP W/TINT 26 (MISCELLANEOUS) ×1 IMPLANT
CLIP RETRACTION 3.0MM CORONARY (MISCELLANEOUS) ×2 IMPLANT
CLIP VESOCCLUDE LG 6/CT (CLIP) ×1 IMPLANT
CLIP VESOCCLUDE MED 24/CT (CLIP) ×1 IMPLANT
CLIP VESOCCLUDE SM WIDE 24/CT (CLIP) ×1 IMPLANT
COVER WAND RF STERILE (DRAPES) ×2 IMPLANT
DERMABOND ADVANCED (GAUZE/BANDAGES/DRESSINGS)
DERMABOND ADVANCED .7 DNX12 (GAUZE/BANDAGES/DRESSINGS) ×2 IMPLANT
DRAIN CHANNEL 10M FLAT 3/4 FLT (DRAIN) ×1 IMPLANT
DRAIN CHANNEL 28F RND 3/8 FF (WOUND CARE) ×6 IMPLANT
DRAPE CARDIOVASCULAR INCISE (DRAPES) ×3
DRAPE HALF SHEET 40X57 (DRAPES) ×1 IMPLANT
DRAPE SLUSH/WARMER DISC (DRAPES) ×3 IMPLANT
DRAPE SRG 135X102X78XABS (DRAPES) ×2 IMPLANT
DRSG AQUACEL AG ADV 3.5X 6 (GAUZE/BANDAGES/DRESSINGS) ×1 IMPLANT
DRSG AQUACEL AG ADV 3.5X14 (GAUZE/BANDAGES/DRESSINGS) ×2 IMPLANT
DRSG TEGADERM 4X4.5 CHG (GAUZE/BANDAGES/DRESSINGS) ×1 IMPLANT
ELECT CAUTERY BLADE 6.4 (BLADE) ×2 IMPLANT
ELECT REM PT RETURN 9FT ADLT (ELECTROSURGICAL) ×6
ELECTRODE REM PT RTRN 9FT ADLT (ELECTROSURGICAL) ×4 IMPLANT
EVACUATOR SILICONE 100CC (DRAIN) ×1 IMPLANT
FELT TEFLON 1X6 (MISCELLANEOUS) ×5 IMPLANT
GAUZE SPONGE 4X4 12PLY STRL (GAUZE/BANDAGES/DRESSINGS) ×6 IMPLANT
GAUZE SPONGE 4X4 12PLY STRL LF (GAUZE/BANDAGES/DRESSINGS) ×2 IMPLANT
GAUZE XEROFORM 1X8 LF (GAUZE/BANDAGES/DRESSINGS) ×1 IMPLANT
GLOVE BIOGEL PI IND STRL 6.5 (GLOVE) IMPLANT
GLOVE BIOGEL PI INDICATOR 6.5 (GLOVE) ×2
GLOVE INDICATOR 7.5 STRL GRN (GLOVE) ×1 IMPLANT
GLOVE NEODERM STRL 7.5 LF PF (GLOVE) ×6 IMPLANT
GLOVE SURG NEODERM 7.5  LF PF (GLOVE) ×2
GOWN STRL REUS W/ TWL LRG LVL3 (GOWN DISPOSABLE) ×8 IMPLANT
GOWN STRL REUS W/TWL LRG LVL3 (GOWN DISPOSABLE) ×15
HEMOSTAT POWDER SURGIFOAM 1G (HEMOSTASIS) ×6 IMPLANT
HEMOSTAT SURGICEL 2X14 (HEMOSTASIS) ×3 IMPLANT
KIT BASIN OR (CUSTOM PROCEDURE TRAY) ×3 IMPLANT
KIT SUCTION CATH 14FR (SUCTIONS) ×2 IMPLANT
KIT TURNOVER KIT B (KITS) ×3 IMPLANT
KIT VASOVIEW HEMOPRO 2 VH 4000 (KITS) ×2 IMPLANT
LEAD PACING MYOCARDI (MISCELLANEOUS) ×2 IMPLANT
MARKER GRAFT CORONARY BYPASS (MISCELLANEOUS) ×6 IMPLANT
NDL 18GX1X1/2 (RX/OR ONLY) (NEEDLE) ×2 IMPLANT
NEEDLE 18GX1X1/2 (RX/OR ONLY) (NEEDLE) IMPLANT
NS IRRIG 1000ML POUR BTL (IV SOLUTION) ×15 IMPLANT
PACK CHEST (CUSTOM PROCEDURE TRAY) ×1 IMPLANT
PACK E OPEN HEART (SUTURE) ×2 IMPLANT
PACK OPEN HEART (CUSTOM PROCEDURE TRAY) ×2 IMPLANT
PACK SPY-PHI (KITS) IMPLANT
PAD ARMBOARD 7.5X6 YLW CONV (MISCELLANEOUS) ×6 IMPLANT
PAD ELECT DEFIB RADIOL ZOLL (MISCELLANEOUS) ×3 IMPLANT
PENCIL BUTTON HOLSTER BLD 10FT (ELECTRODE) ×2 IMPLANT
POSITIONER HEAD DONUT 9IN (MISCELLANEOUS) ×3 IMPLANT
STAPLER VISISTAT 35W (STAPLE) ×1 IMPLANT
SUT BONE WAX W31G (SUTURE) ×2 IMPLANT
SUT ETHIBOND X763 2 0 SH 1 (SUTURE) ×2 IMPLANT
SUT ETHILON 3 0 FSL (SUTURE) ×1 IMPLANT
SUT MNCRL AB 3-0 PS2 18 (SUTURE) ×4 IMPLANT
SUT PDS AB 1 CTX 36 (SUTURE) ×4 IMPLANT
SUT PROLENE 3 0 SH DA (SUTURE) ×3 IMPLANT
SUT PROLENE 4 0 RB 1 (SUTURE)
SUT PROLENE 4 0 SH DA (SUTURE) IMPLANT
SUT PROLENE 4-0 RB1 .5 CRCL 36 (SUTURE) IMPLANT
SUT PROLENE 5 0 C 1 36 (SUTURE) ×2 IMPLANT
SUT PROLENE 6 0 C 1 30 (SUTURE) ×6 IMPLANT
SUT PROLENE 8 0 BV175 6 (SUTURE) IMPLANT
SUT PROLENE BLUE 7 0 (SUTURE) ×2 IMPLANT
SUT SILK  1 MH (SUTURE) ×2
SUT SILK 1 MH (SUTURE) IMPLANT
SUT SILK 2 0 SH CR/8 (SUTURE) ×1 IMPLANT
SUT SILK 3 0 SH CR/8 (SUTURE) IMPLANT
SUT STEEL 6MS V (SUTURE) ×2 IMPLANT
SUT STEEL SZ 6 DBL 3X14 BALL (SUTURE) ×2 IMPLANT
SUT VIC AB 0 CT1 27 (SUTURE) ×6
SUT VIC AB 0 CT1 27XBRD ANBCTR (SUTURE) IMPLANT
SUT VIC AB 2-0 CT1 27 (SUTURE) ×3
SUT VIC AB 2-0 CT1 TAPERPNT 27 (SUTURE) IMPLANT
SUT VIC AB 2-0 CTX 27 (SUTURE) IMPLANT
SUT VIC AB 2-0 UR6 27 (SUTURE) ×1 IMPLANT
SUT VIC AB 3-0 X1 27 (SUTURE) IMPLANT
SYR 10ML LL (SYRINGE) IMPLANT
SYR 20ML LL LF (SYRINGE) ×1 IMPLANT
SYR 30ML LL (SYRINGE) ×2 IMPLANT
SYR 3ML LL SCALE MARK (SYRINGE) ×2 IMPLANT
SYSTEM SAHARA CHEST DRAIN ATS (WOUND CARE) ×4 IMPLANT
TAPE CLOTH SURG 4X10 WHT LF (GAUZE/BANDAGES/DRESSINGS) ×1 IMPLANT
TOWEL GREEN STERILE (TOWEL DISPOSABLE) ×3 IMPLANT
TOWEL GREEN STERILE FF (TOWEL DISPOSABLE) ×2 IMPLANT
TRAY FOLEY SLVR 16FR TEMP STAT (SET/KITS/TRAYS/PACK) ×2 IMPLANT
TUBE CONNECTING 20X1/4 (TUBING) ×2 IMPLANT
TUBING LAP HI FLOW INSUFFLATIO (TUBING) ×2 IMPLANT
UNDERPAD 30X30 (UNDERPADS AND DIAPERS) ×3 IMPLANT
WATER STERILE IRR 1000ML POUR (IV SOLUTION) ×6 IMPLANT
WATER STERILE IRR 1000ML UROMA (IV SOLUTION) IMPLANT

## 2019-02-01 NOTE — Progress Notes (Signed)
  Echocardiogram Echocardiogram Transesophageal has been performed.  Philip Wolfe M 02/08/2019, 9:41 AM

## 2019-02-01 NOTE — Anesthesia Procedure Notes (Signed)
Date/Time: 01/25/2019 9:11 AM Performed by: Colin Benton, CRNA Pre-anesthesia Checklist: Patient identified, Emergency Drugs available, Suction available and Patient being monitored Patient Re-evaluated:Patient Re-evaluated prior to induction Oxygen Delivery Method: Circle system utilized Preoxygenation: Pre-oxygenation with 100% oxygen Induction Type: Inhalational induction with existing ETT Placement Confirmation: positive ETCO2 Tube secured with: Tube holder. Dental Injury: Teeth and Oropharynx as per pre-operative assessment

## 2019-02-01 NOTE — Progress Notes (Signed)
eLink Physician-Brief Progress Note Patient Name: Philip Wolfe DOB: 11-22-1950 MRN: [id number]   Date of Service  02/21/2019  HPI/Events of Note  Agitation - Currently on a Fentanyl IV infusion at ceiling and a Versed IV infusion.   eICU Interventions  Will order: 1. Will increase the ceiling on the Versed IV infusion to 4 mg/hour.     Intervention Category Major Interventions: Delirium, psychosis, severe agitation - evaluation and management  Cong Hightower [place] 02-21-19, 9:44 PM

## 2019-02-01 NOTE — Transfer of Care (Signed)
Immediate Anesthesia Transfer of Care Note  Patient: Philip Wolfe  Procedure(s) Performed: Geni Bers FOR ECMO (EXTRACORPOREAL MEMBRANE OXYGENATION); BILATERAL CHEST TUBE PLACEMENT; INSERTION OF TEMPORARY DIALYSIS CATHETER (N/A ) TRANSESOPHAGEAL ECHOCARDIOGRAM (TEE) (N/A ) CHEST TUBE INSERTION (Bilateral )  Patient Location: ICU  Anesthesia Type:General  Level of Consciousness: sedated and Patient remains intubated per anesthesia plan  Airway & Oxygen Therapy: Patient remains intubated per anesthesia plan and Patient placed on Ventilator (see vital sign flow sheet for setting)  Post-op Assessment: Report given to RN and Post -op Vital signs reviewed and stable  Post vital signs: Reviewed and stable  Last Vitals:  Vitals Value Taken Time  BP    Temp    Pulse    Resp    SpO2      Last Pain:  Vitals:   2019/02/07 0800  TempSrc: Bladder  PainSc:          Complications: No apparent anesthesia complications

## 2019-02-01 NOTE — Progress Notes (Signed)
ANTICOAGULATION CONSULT NOTE   Pharmacy Consult for Heparin  Indication: pulmonary embolus (S/P ECMO)  No Known Allergies  Patient Measurements: Height: 5\' 9"  (175.3 cm) Weight: (!) 335 lb 15.7 oz (152.4 kg) IBW/kg (Calculated) : 70.7 Heparin Dosing Weight: 103 kg  Vital Signs: Temp: 98.1 F (36.7 C) (12/16 2000) Temp Source: Bladder (12/16 2000) BP: 108/49 (12/16 2000) Pulse Rate: 88 (12/16 2000)  Labs: Recent Labs    01/31/19 0331 01/31/19 0332 01/31/19 1634 01/31/19 1635 01/31/19 2307 01/29/2019 0500 02/06/2019 0808 01/17/2019 1214 01/18/2019 1223 02/14/2019 1608 01/17/2019 1707 02/06/2019 1937  HGB 7.9*   < > 8.4*   < > 8.5* 8.0*  --  8.6* 8.5* 8.9* 8.5*  --   HCT 23.6*   < > 26.0*   < > 26.0* 24.6*  --  27.2* 25.0* 27.4* 25.0*  --   PLT 90*   < > 79*  --  96* 97*  --  96*  --  115*  --   --   APTT 84*  --   --   --  62* 57*  --   --   --   --   --   --   LABPROT 16.9*  --   --   --  16.4* 16.7*  --   --   --   --   --   --   INR 1.4*  --   --   --  1.3* 1.4*  --   --   --   --   --   --   HEPARINUNFRC  --   --   --   --  0.24*  --  0.27*  --   --   --   --  0.34  CREATININE 2.42*   < > 2.66*  --   --  2.75*  --   --   --  2.92*  --   --    < > = values in this interval not displayed.    Estimated Creatinine Clearance: 35.9 mL/min (A) (by C-G formula based on SCr of 2.92 mg/dL (H)).  Assessment: 68 yr old male admitted with massive PE and RV failure. Pt received 150 mg total of systemic tPA with continued shock, so ECMO started 12/8. EKOS started along with IV heparin, then repeated 12/10 with significant clot burden. RV function improving on ECHO 12/11 - holding off further tPA/thrombectomy.  Pt de-cannulated this AM without complications.  Heparin level drawn this evening was 0.34 units/ml, which is within the goal range for this patient. Per RN, no issues with IV or bleeding observed. H/H 8.5/25.0, platelets 115.  Goal of Therapy:  Heparin level 0.3-0.5 units/mL for  the first 24 hours post de-cannulation Monitor platelets by anticoagulation protocol: Yes    Plan:  Continue heparin at 1500 units/hr Check 6-hr heparin level Monitor daily heparin level, CBC Monitor for signs/symptoms of bleeding  Gillermina Hu, PharmD, BCPS, [facility] Clinical Pharmacist 01/17/2019 8:27 PM

## 2019-02-01 NOTE — Progress Notes (Signed)
ANTICOAGULATION CONSULT NOTE - Follow Up Consult  Pharmacy Consult for heparin Indication: PE and ECMO  Labs: Recent Labs    02/08/2019 0455 01/26/2019 0510 01/27/2019 1607 02/05/2019 2044 01/31/19 0331 01/31/19 0332 01/31/19 1012 01/31/19 1450 01/31/19 1634 01/31/19 1635 01/31/19 1711 01/31/19 2307  HGB 8.3*  --    < > 8.4* 7.9*  --  7.9*  --  8.4*  --  7.1* 8.5*  HCT 24.4*  --    < > 24.7* 23.6*  --  24.3*  --  26.0*  --  21.0* 26.0*  PLT 72*   < >   < > 81* 90*  --  81*  --  79*  --   --  96*  APTT 90*  --   --   --  84*  --   --   --   --   --   --  62*  LABPROT 16.9*  --   --  16.5* 16.9*  --   --   --   --   --   --  16.4*  INR 1.4*  --   --  1.3* 1.4*  --   --   --   --   --   --  1.3*  HEPARINUNFRC 0.39   < >  --   --   --  0.35  --   --   --  0.30  --  0.24*  CREATININE 1.84*   < >  --   --  2.42*  --   --  2.59* 2.66*  --   --   --    < > = values in this interval not displayed.    Assessment: 68yo male subtherapeutic on heparin at recheck; no gtt issues per RN though she reports some oozing which seems to be new; Hgb is up some 7.1>8.5.  Goal of Therapy:  Heparin level 0.3-0.5 units/ml   Plan:  Will increase heparin gtt conservatively to 1500 units/hr and check level in 8 hours.    Wynona Neat, PharmD, BCPS  02-20-2019,12:46 AM

## 2019-02-01 NOTE — Progress Notes (Signed)
Patient ID: Philip Wolfe, male   DOB: 04/20/1950, 68 y.o.   MRN: [id number] ]    Advanced Heart Failure Rounding Note  PCP-Cardiologist: No primary care provider on file.   Subjective:    - Massive PE with cardiogenic shock: VA ECMO initiated 12/8. Patient then had PA catheter-directed tPA until 8 am 12/9.  Left leg DVT.  - 12/9 ENT Merocel packing for severe epistaxis.  - PA catheter-directed tPA 12/10 again.   - TEE-guided ECMO wean on 12/14, initially did well with flow as low as 1 L/min, left at 2.5 L/min.  However, overnight drop in MAP with increase NE so increased flow back to 4.5.   Nasopharyngeal bleeding seems to have resolved after nasal packing by ENT.   Hgb 8,  had 1 unit PRBCs yesterday, no obvious bleeding.  Plts 97K.   Now off epinephrine and vasopressin and on norepinephrine 34 + Giapreza 40.  MAP 80s this morning.  CVP 12 on Lasix gtt 6 mg/hr, 1/O 1 L positive.  Creatinine up to 2.75 from 1.8 after CTA chest with contrast load yesterday.  Heparin gtt ongoing with therapeutic level. iNO off, getting sildenafil 20 mg tid.  Co-ox 78% this morning. LDH 370 => 412 => 450 => 447 => 400. TEE 12/14 showed LV EF 29%, RV systolic function appeared near normal.  Flow has been weaned down on ECMO to 2.5 L/min.   Tube feeds ongoing.   Empiric cefepime ongoing, afebrile, cultures remain negative.  WBCs high 39 => 32 => 29 => 24 => 22 => 18.6   Sedated on Versed/Fentanyl. Will open eyes and respond to pain with sedation wean.   ECMO: Stable, no chugging.  Flow 2.5 L/min  2500 rpm Venous pressure -15 Delta P 16  Objective:   Weight Range: (!) 152.4 kg Body mass index is 49.62 kg/m.   Vital Signs:   Temp:  [97.9 F (36.6 C)-99 F (37.2 C)] 98.6 F (37 C) (12/16 0700) Pulse Rate:  [81-105] 86 (12/16 0735) Resp:  [10-32] 22 (12/16 0735) BP: (99-127)/(50-57) 127/57 (12/16 0735) SpO2:  [98 %-100 %] 99 % (12/16 0735) Arterial Line BP: (90-112)/(48-58) 108/49 (12/16  0700) FiO2 (%):  [40 %] 40 % (12/16 0735) Weight:  [152.4 kg] 152.4 kg (12/16 0500) Last BM Date: 01/31/19  Weight change: Filed Weights   02/15/2019 0500 01/31/19 0500 02/11/2019 0500  Weight: (!) 153.2 kg (!) 149.9 kg (!) 152.4 kg    Intake/Output:   Intake/Output Summary (Last 24 hours) at 02/04/2019 0742 Last data filed at 02/05/2019 0700 Gross per 24 hour  Intake 6049.88 ml  Output 5005 ml  Net 1044.88 ml      Physical Exam    General: NAD Neck: Thick, JVP difficult, no thyromegaly or thyroid nodule.  Lungs: Decreased BS at bases.  CV: Nondisplaced PMI.  Heart regular S1/S2, no S3/S4, no murmur.  Anasarca.    Abdomen: Soft, nontender, no hepatosplenomegaly, no distention.  Skin: Intact without lesions or rashes.  Neurologic: Will awaken with sedation wean.   Extremities: No clubbing or cyanosis.  HEENT: Normal.   Telemetry   NSR 80s-90s (personally reviewed)  Labs    CBC Recent Labs    02/12/2019 0455 02/16/2019 0510 01/31/19 2307 01/20/2019 0500 02/05/2019 0510 01/20/2019 0624  WBC 24.3*   < > 19.0* 18.6*  --   --   NEUTROABS 16.7*  --   --   --   --   --   HGB 8.3*  --  8.5* 8.0* 6.8* 6.8*  HCT 24.4*  --  26.0* 24.6* 20.0* 20.0*  MCV 92.1   < > 94.5 95.7  --   --   PLT 72*   < > 96* 97*  --   --    < > = values in this interval not displayed.   Basic Metabolic Panel Recent Labs    01/29/19 1549 01/29/19 1556 02/10/2019 0455 01/27/2019 0510 01/31/19 0331 01/31/19 1205 01/31/19 1634 01/25/2019 0500 01/31/2019 0510 02/15/2019 0624  NA 144  --  143  --  144  --  148* 144 145 148*  K 4.1  --  4.2  --  4.4  --  4.3 4.1 4.0 3.8  CL 107   < > 107   < > 107   < > 106 105  --   --   CO2 26   < > 26   < > 26   < > 26 25  --   --   GLUCOSE 170*   < > 185*   < > 185*   < > 175* 259*  --   --   BUN 52*   < > 60*   < > 73*   < > 82* 89*  --   --   CREATININE 1.76*   < > 1.84*   < > 2.42*   < > 2.66* 2.75*  --   --   CALCIUM 7.0*   < > 7.0*   < > 7.5*   < > 7.6* 7.7*  --    --   MG 2.2  --  2.4  --  2.5*  --   --  2.3  --   --   PHOS 2.8  --  4.5  --   --   --   --   --   --   --    < > = values in this interval not displayed.   Liver Function Tests Recent Labs    01/31/19 1634 01/19/2019 0500  AST 101* 102*  ALT 43 46*  ALKPHOS 99 102  BILITOT 2.0* 1.5*  PROT 4.6* 4.5*  ALBUMIN 2.2* 2.0*   No results for input(s): LIPASE, AMYLASE in the last 72 hours. Cardiac Enzymes No results for input(s): CKTOTAL, CKMB, CKMBINDEX, TROPONINI in the last 72 hours.  BNP: BNP (last 3 results) Recent Labs    02/15/2019 1505  BNP 57.6    ProBNP (last 3 results) No results for input(s): PROBNP in the last 8760 hours.   D-Dimer Recent Labs    01/29/19 0750  DDIMER 0.89*   Hemoglobin A1C No results for input(s): HGBA1C in the last 72 hours. Fasting Lipid Panel Recent Labs    01/21/2019 0455  TRIG 208*   Thyroid Function Tests No results for input(s): TSH, T4TOTAL, T3FREE, THYROIDAB in the last 72 hours.  Invalid input(s): FREET3  Other results:   Imaging    No results found.   Medications:     Scheduled Medications: . sodium chloride   Intravenous Once  . bisacodyl  10 mg Oral Daily   Or  . bisacodyl  10 mg Rectal Daily  . chlorhexidine gluconate (MEDLINE KIT)  15 mL Mouth Rinse BID  . Chlorhexidine Gluconate Cloth  6 each Topical Daily  . docusate sodium  200 mg Oral Daily  . feeding supplement (PRO-STAT SUGAR FREE 64)  30 mL Per Tube TID  . insulin aspart  0-20 Units Subcutaneous Q4H  . insulin  glargine  12 Units Subcutaneous Q24H  . mouth rinse  15 mL Mouth Rinse 10 times per day  . pantoprazole (PROTONIX) IV  40 mg Intravenous Q12H  . sildenafil  20 mg Oral TID  . sodium chloride flush  3 mL Intravenous Q12H    Infusions: . sodium chloride 10 mL/hr at 02/16/2019 0800  . sodium chloride 20 mL/hr at 02/05/2019 0700  . angiotensin II (GIAPREZA) infusion 40 ng/kg/min (01/21/2019 0700)  . ceFEPime (MAXIPIME) IV Stopped (01/22/2019 0231)   . feeding supplement (VITAL 1.5 CAL) 1,000 mL (01/31/19 1158)  . fentaNYL infusion INTRAVENOUS 150 mcg/hr (02/16/2019 0700)  . furosemide (LASIX) infusion 6 mg/hr (02/14/2019 0700)  . heparin 1,500 Units/hr (02/08/2019 0700)  . midazolam 0.5 mg/hr (01/21/2019 0700)  . norepinephrine (LEVOPHED) Adult infusion 34 mcg/min (01/31/2019 0700)    PRN Medications: sodium chloride, calcium chloride, dextrose, fentaNYL, fentaNYL, metoprolol tartrate, midazolam, midazolam, morphine injection, ondansetron (ZOFRAN) IV, oxyCODONE, sodium chloride flush, traMADol   Assessment/Plan   1.  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  VA ECMO initiated 12/8.  Currently good flow (4-5 L/min range) at 3600 rpm.  Still with significant pressor requirement despite catheter-directed tPA x 2 now.  He remains on norepinephrine 34 and Giapreza 40, off vasopressin and epinephrine.  TEE from 12/14 showed EF 65% with RV function near normal.  On 12/14, flow decreased to 1 L/min transiently and tolerated, left at 2.5 L/min overnight but had wide pulse pressure with low MAP and had to increase flow back to 4.5 L/min.  Suspect vasodilation/vasoplegia, SVR 295 so started on Giapreza.  Weaned back down to 2.5 L/min overnight 12/15 on ECMO and remains on this level of support.  MAP 80s, CVP 13.  Now on Lasix gtt at 6 mg/hr with mildly positive I/Os and CVP 13.  Creatinine mildly higher 2.75 after contrast load with CTA.  - Continue norepinephrine and Giapreza as needed.   - Patient has tolerated ECMO wean, to OR today will follow further flow wean under TEE and decannulate if patient looks stable.    - Continue sildenafil 20 mg tid.      - Would increase Lasix back to 8 mg/hr to keep slightly negative, careful with elevated creatinine.   2. AKI: Creatinine mildly higher at 2.75, follow closely.  May be contrast nephropathy with several CTAs. 3. Acute hypoxic/hypoxemic respiratory failure: FiO2 0.4,  improved ABG on ECMO.  4. ID: WBCs elevated but afebrile (trending down).  No PNA on CXR.  PCT not elevated initially.  Blood and trach aspirate cultures so far negative.  - Empiric coverage with cefepime .  5. Pulmonary embolus: Massive bilateral PE with left leg DVT.  He got 150 mg IV total tPA initially, then catheter directed tPA to both PAs on 12/9 and again on 12/10. CTA 12/14 showed some improvement but still with high clot burden in right PA.  - IR-guided thrombectomy is possible eventual strategy (Dr. Jacqualyn Posey with IR aware of patient).   - Continue heparin gtt, level therapeutic.  6. ENT: ENT bleeding, has had nasal packing by ENT, resolved.  7. Acute blood loss anemia: ENT bleeding initially but this has slowed.  Had some bleeding around arterial cannula.  Transfuse hgb < 8. He had 1 more unit PRBCs yesterday.  - Continue IV Protonix.  8. Thrombocytopenia: Plts 92 K this morning, improving.  Suspect due to critical illness.  - Transfuse plts < 50K.  9.  FEN: Continue TFs.   10. Neuro: Lighten sedation to see if we can wake up this morning.   CRITICAL CARE Performed by: Loralie Champagne  Total critical care time: 45 minutes  Critical care time was exclusive of separately billable procedures and treating other patients.  Critical care was necessary to treat or prevent imminent or life-threatening deterioration.  Critical care was time spent personally by me on the following activities: development of treatment plan with patient and/or surrogate as well as nursing, discussions with consultants, evaluation of patient's response to treatment, examination of patient, obtaining history from patient or surrogate, ordering and performing treatments and interventions, ordering and review of laboratory studies, ordering and review of radiographic studies, pulse oximetry and re-evaluation of patient's condition.   Length of Stay: 9  Loralie Champagne, MD  01/21/2019, 7:42 AM  Advanced Heart  Failure Team Pager [phone] (M-F; 7a - 4p)  Please contact [facility] for night-coverage after hours (4p -7a ) and weekends on [email or web address]

## 2019-02-01 NOTE — Progress Notes (Signed)
EVENING ROUNDS NOTE :     [place] [address]       [place] [address]             [phone]                 Day of Surgery Procedure(s) (LRB): DECANNULATION FOR ECMO (EXTRACORPOREAL MEMBRANE OXYGENATION); BILATERAL CHEST TUBE PLACEMENT; INSERTION OF TEMPORARY DIALYSIS CATHETER (N/A) TRANSESOPHAGEAL ECHOCARDIOGRAM (TEE) (N/A) CHEST TUBE INSERTION (Bilateral)   Total Length of Stay:  LOS: 9 days  Events:  S/p decannulation ABG    Component Value Date/Time   PHART 7.432 01/21/2019 1707   PCO2ART 39.7 02/12/2019 1707   PO2ART 246.0 (H) 01/25/2019 1707   HCO3 26.4 02/10/2019 1707   TCO2 28 01/29/2019 1707   ACIDBASEDEF 3.0 (H) 01/26/2019 0555   O2SAT 100.0 02/08/2019 1707   Increasing sedation since overbreathing the vent    BP (!) 95/52   Pulse 94   Temp 99 F (37.2 C)   Resp (!) 31   Ht 5\' 9"  (1.753 m)   Wt (!) 152.4 kg   SpO2 97%   BMI 49.62 kg/m   CVP:  [9 mmHg-16 mmHg] 15 mmHg  Vent Mode: PRVC FiO2 (%):  [40 %-100 %] 60 % Set Rate:  [15 bmp-18 bmp] 18 bmp Vt Set:  [560 mL] 560 mL PEEP:  [5 cmH20-12 cmH20] 12 cmH20 Plateau Pressure:  [14 cmH20-30 cmH20] 30 cmH20  .  prismasol BGK 4/2.5 500 mL/hr at 02/02/2019 1519  .  prismasol BGK 4/2.5 400 mL/hr at 01/27/2019 1519  . sodium chloride 10 mL/hr at 02/04/2019 0800  . angiotensin II (GIAPREZA) infusion 35 ng/kg/min (02/09/2019 1700)  . ceFEPime (MAXIPIME) IV    . feeding supplement (VITAL 1.5 CAL) Stopped (01/17/2019 0759)  . fentaNYL infusion INTRAVENOUS 200 mcg/hr (02/02/2019 1700)  . furosemide (LASIX) infusion Stopped (01/22/2019 1429)  . heparin 1,500 Units/hr (01/17/2019 1700)  . midazolam 1 mg/hr (02/02/2019 1700)  . norepinephrine (LEVOPHED) Adult infusion 40 mcg/min (02/06/2019 1700)  . prismasol BGK 4/2.5 2,000 mL/hr at 02/15/2019 1520    I/O last 3 completed shifts: In: 8054.4 [I.V.:4946.9; Blood:661; GY/IR:4854.6; IV Piggyback:806.9] Out: 7900 [Urine:7600; Stool:300]   CBC Latest Ref Rng & Units  02/06/2019 02/16/2019 01/28/2019  WBC 4.0 - 10.5 K/uL - 21.5(H) -  Hemoglobin 13.0 - 17.0 g/dL 8.5(L) 8.9(L) 8.5(L)  Hematocrit 39.0 - 52.0 % 25.0(L) 27.4(L) 25.0(L)  Platelets 150 - 400 K/uL - 115(L) -    BMP Latest Ref Rng & Units 02/08/2019 01/17/2019 02/11/2019  Glucose 70 - 99 mg/dL - - -  BUN 8 - 23 mg/dL - - -  Creatinine 0.61 - 1.24 mg/dL - - -  Sodium 135 - 145 mmol/L 147(H) 148(H) 148(H)  Potassium 3.5 - 5.1 mmol/L 4.3 4.4 3.8  Chloride 98 - 111 mmol/L - - -  CO2 22 - 32 mmol/L - - -  Calcium 8.9 - 10.3 mg/dL - - -    ABG    Component Value Date/Time   PHART 7.432 02/13/2019 1707   PCO2ART 39.7 01/20/2019 1707   PO2ART 246.0 (H) 02/09/2019 1707   HCO3 26.4 01/17/2019 1707   TCO2 28 02/04/2019 1707   ACIDBASEDEF 3.0 (H) 01/26/2019 0555   O2SAT 100.0 02/15/2019 [place], MD 01/29/2019 5:23 PM

## 2019-02-01 NOTE — Progress Notes (Signed)
Patient was brought to the OR by the anesthesia team, the ECMO Specialist and Perfusionist on oxygen tank and battery power.  Upon arrival, circuit was plugged into a red outlet, and wall oxygen source.  5,000 IU of Heparin were given, flows were turned down per Dr. Orvan Seen, TEE was evaluated. Cardiac function was determined to be sufficient and ECMO was clamped out at 0957.  Patient remained stable during the weaning process.

## 2019-02-01 NOTE — Anesthesia Preprocedure Evaluation (Signed)
Anesthesia Evaluation  Patient identified by MRN, date of birth, ID band Patient unresponsive    Reviewed: Patient's Chart, lab work & pertinent test results, Unable to perform ROS - Chart review only  Airway Mallampati: Intubated       Dental   Pulmonary sleep apnea , PE    + decreased breath sounds      Cardiovascular hypertension, + DVT   Rhythm:regular Rate:Tachycardia     Neuro/Psych    GI/Hepatic   Endo/Other    Renal/GU Renal InsufficiencyRenal disease     Musculoskeletal  (+) Arthritis ,   Abdominal   Peds  Hematology   Anesthesia Other Findings   Reproductive/Obstetrics                             Anesthesia Physical  Anesthesia Plan  ASA: IV  Anesthesia Plan: General   Post-op Pain Management:    Induction: Intravenous  PONV Risk Score and Plan: 2 and Ondansetron, Dexamethasone and Treatment may vary due to age or medical condition  Airway Management Planned: Oral ETT  Additional Equipment: Arterial line, CVP, PA Cath, TEE and Ultrasound Guidance Line Placement  Intra-op Plan:   Post-operative Plan: Post-operative intubation/ventilation  Informed Consent: I have reviewed the patients History and Physical, chart, labs and discussed the procedure including the risks, benefits and alternatives for the proposed anesthesia with the patient or authorized representative who has indicated his/her understanding and acceptance.       Plan Discussed with: CRNA, Anesthesiologist and Surgeon  Anesthesia Plan Comments:         Anesthesia Quick Evaluation

## 2019-02-01 NOTE — Anesthesia Postprocedure Evaluation (Signed)
Anesthesia Post Note  Patient: Philip Wolfe  Procedure(s) Performed: Geni Bers FOR ECMO (EXTRACORPOREAL MEMBRANE OXYGENATION); BILATERAL CHEST TUBE PLACEMENT; INSERTION OF TEMPORARY DIALYSIS CATHETER (N/A ) TRANSESOPHAGEAL ECHOCARDIOGRAM (TEE) (N/A ) CHEST TUBE INSERTION (Bilateral )     Patient location during evaluation: SICU Anesthesia Type: General Level of consciousness: sedated Pain management: pain level controlled Vital Signs Assessment: post-procedure vital signs reviewed and stable Respiratory status: patient remains intubated per anesthesia plan Cardiovascular status: stable Postop Assessment: no apparent nausea or vomiting Anesthetic complications: no    Last Vitals:  Vitals:   02/02/2019 2000 02/08/2019 2100  BP: (!) 108/49   Pulse: 88 80  Resp: (!) 25 (!) 21  Temp: 36.7 C 36.6 C  SpO2: 93% 94%    Last Pain:  Vitals:   01/21/2019 2000  TempSrc: Bladder  PainSc:                  Tiajuana Amass

## 2019-02-01 NOTE — H&P (Signed)
History and Physical Interval Note:  01/21/2019 8:43 AM  Philip Wolfe  has presented today for surgery, with the diagnosis of ECMO.  The various methods of treatment have been discussed with the patient and family. After consideration of risks, benefits and other options for treatment, the patient has consented to  Procedure(s): DECANNULATION FOR ECMO (EXTRACORPOREAL MEMBRANE OXYGENATION) (N/A) TRANSESOPHAGEAL ECHOCARDIOGRAM (TEE) (N/A) CHEST TUBE INSERTION (Bilateral) as a surgical intervention.  The patient's history has been reviewed, patient examined, no change in status, stable for surgery.  I have reviewed the patient's chart and labs.  Questions were answered to the patient's family's satisfaction.     Philip Wolfe

## 2019-02-01 NOTE — Plan of Care (Signed)
  Problem: Clinical Measurements: Goal: Ability to maintain clinical measurements within normal limits will improve Outcome: Progressing Goal: Respiratory complications will improve Outcome: Progressing Goal: Cardiovascular complication will be avoided Outcome: Progressing   Problem: Activity: Goal: Risk for activity intolerance will decrease Outcome: Not Progressing   Problem: Elimination: Goal: Will not experience complications related to bowel motility Outcome: Progressing Goal: Will not experience complications related to urinary retention Outcome: Progressing   Problem: Safety: Goal: Ability to remain free from injury will improve Outcome: Progressing   Problem: Skin Integrity: Goal: Risk for impaired skin integrity will decrease Outcome: Not Progressing   Problem: Clinical Measurements: Goal: Ability to maintain clinical measurements within normal limits will improve Outcome: Progressing   Problem: Pain Managment: Goal: General experience of comfort will improve Outcome: Progressing

## 2019-02-01 NOTE — Progress Notes (Signed)
Palliative:  HPI: 68 yo male with PMH HTN, arthritis, sleep apnea, obesity admitted 02/09/2019 with hypoxia respiratory failure with massive bilateral PE leading to PEA arrest in ED. Intubated and found to have cardiogenic shock with RV failure. Began [place] ECMO 01/29/2019 and decannulated 01/22/2019. Plans for CRRT and continues on full support via vent.   I met today with at Philip Wolfe's bedside. He was decannulated today from ECMO. On vent with pressors and sedation. No family at bedside.   I received a call from wife, Philip Wolfe. Philip Wolfe would like to have meeting with myself and other providers as available and her daughter and son to further discuss expectations and prognosis. They have concerns and want to ensure that we are continuing care that is aligned with his previously expressed wishes. We will speak/meet 02/02/19 at 1300 pm.   Exam: Sedated on vent. No distress. HR regular. 100% FIO2 on vent. Abd soft. JPs charged and chest tube in place to suction drainage system. Dressings clean, dry, intact.   Plan: - Family meeting 02/02/19 1300 pm for continued [place] conversation.   15 min  Vinie Sill, NP Palliative Medicine Team Pager [phone] (Please see [email or web address] for schedule) Team Phone [phone]    Greater than 50%  of this time was spent counseling and coordinating care related to the above assessment and plan

## 2019-02-01 NOTE — Progress Notes (Signed)
NAME:  Philip Wolfe, MRN:  [id number], DOB:  1950-12-20, LOS: 9 ADMISSION DATE:  02/11/2019, CONSULTATION DATE:  01/27/2019 REFERRING MD:  Dr. Madilyn Hook EDP, CHIEF COMPLAINT:  Hypoxia   Brief History   68 year old male admitted 12/7 with massive PE s/p thrombolytics and catheter directed lysis.  Has cardiac arrest, significant RV failure requiring initiation of ECMO  Past Medical History   has a past medical history of Arthritis, Hypertension, Sleep apnea, Ulcer (left medial ankle), and Venous stasis ulcer (HCC).   Significant Hospital Events   12/7-Admit, intubated coded, given TPA 800 mg and then 50 mg 6 hours later 12/8- Cannulated for [place] ECMO 01/18/2019 and underwent EKOS directed catheter placement.  Inhaled NO started 12/9- Significant epistaxis requiring 3 units PRBC and nasal packing by ENT, bronchoscopy with no evidence of pulmonary bleed 12/10- Second round of EKOS directed catheter TPA, nasal packing replaced by [facility] catheter by ENT 12/11-reviewed echo with cardiology.  RV function is improving.  Held off further lytics/thrombectomy 12/13-requiring daily blood transfusions.  No evidence of overt bleeding.  Pressors weaning down 12/14 TEE with flow wean 12/15 giapreza added.  12/17 going to OR for TEE and possible decannulation  Consults:  PCCM, cardiology, cardiothoracic surgery  Procedures:  R axillary arterial catheter, R femoral venous catheter, RIJ introducer, LIJ MML, L radial artery  Significant Diagnostic Tests:  CTA 12/7-massive bilateral central pulmonary emboli with significant decrease in pulmonary artery flow, patchy groundglass infiltrate, rib fractures on the right.  CT head 12/7-generalized atrophy, chronic ischemic microangiopathy.  Echo 12/9-severe reduction in RV systolic function with RV volume and pressure overload.  LVEF 50-55%  Micro Data:  Blood cultures 12/7-negative Blood culture 12/8-negative MRSA PCR 12/8-negative Respiratory culture  12/10-negative  Antimicrobials:  Cefepime 12/10 > Vanco 12/9 >> 12/13  Interim history/subjective:   ECHMO flow at 2.5 l/min.  MAP 80s CVP 13, SVR 295. Awaiting OR later today deciding on if can be decannulated.  Objective   Blood pressure (Abnormal) 127/57, pulse 89, temperature 98.4 F (36.9 C), temperature source Bladder, resp. rate (Abnormal) 23, height 5\' 9"  (1.753 m), weight (Abnormal) 152.4 kg, SpO2 99 %. CVP:  [9 mmHg-16 mmHg] 13 mmHg  Vent Mode: PCV FiO2 (%):  [40 %] 40 % Set Rate:  [15 bmp] 15 bmp PEEP:  [5 cmH20] 5 cmH20 Plateau Pressure:  [14 cmH20-15 cmH20] 14 cmH20   Intake/Output Summary (Last 24 hours) at 01/30/2019 0835 Last data filed at 02/14/2019 0800 Gross per 24 hour  Intake 6087.47 ml  Output 4880 ml  Net 1207.47 ml   Filed Weights   01-Feb-2019 0500 01/31/19 0500 02/04/2019 0500  Weight: (Abnormal) 153.2 kg (Abnormal) 149.9 kg (Abnormal) 152.4 kg   Examination General 68 year old white male remains on full ventilatory support & VA ECMO. Massively volume overloaded.  HENT orally intubated. Has foley catheter in right nare to tamponade bleeding Pulm rhonchus equal chest rise. occ cough  Card/chest: RRR axillary ECMO lines abd hypoactive  Ext massive anasarca w/ weeping extremities  Neuro sedated  GU decreased UOP  Resolved Hospital Problem list     Assessment & Plan:  Acute cor pulmonale due to massive PE- S/p TPA x 2 and EKOS X 2, on heparin drip and VA ECMO.  Suspect this is PE on CTEPH given clinical history and response to therapy. Plan Cont IV heparin  Will need life long AC if survives.   Circulatory shock. Looks more vasodilatory/vasoplegic than cardiogenic w/ SVR of 295;  but does have RV failure 2/2 PE Plan Cont current vasoactive gtts (norepi and giapreza) Volume removal per advanced heart failure team ECMO per HF team.   Acute hypoxic, hypercarbic respiratory failure- in setting of above,gas exchange handled primarily through sweep  changes Plan Cont low vt ventilation VAP bundle  Ongoing transfusion-dependent anemia- LDH stable but low grade hemolysis inevitable, had significant nasopharyngeal bleeding but this now under control Plan Cont to trend cbc  Transfuse as indicated  Leukocytosis- stable, reactive, no obvious infection Plan Cefepime day 8.to continue as long as nasal packing in place.  Cultures neg   Thrombocytopenia- reactive and some consumption due to ECMO inevitable, looks okay today Plan Cont to trend   Epistaxis- packed by ENT with good response, appreciate help Plan Packing per ENT Empiric abx as above    Hyperglyemia Plan ssi   AKI 2/2 cardiorenal syndrome and ATN w/ Diffuse anasarca- related to reactive hypo-osmolar state, immobility, and fluid administration, probable mild volume overloaded state of heart  Scr cont to rise Plan Will need HD cath and nephrology consult Needs CRRT    Best practice Nutrition: tubefeeds PUD: PPI VTE: IV heparin Sedation protocol VAP protocol   cct 32 mintues.   Erick Colace ACNP-BC [place] Pager # [phone] OR # [phone] if no answer

## 2019-02-01 NOTE — Brief Op Note (Signed)
01/25/2019  12:03 PM  PATIENT:  Philip Wolfe  68 y.o. male  PRE-OPERATIVE DIAGNOSIS:  V-A ECMO for pulmonary embolus  POST-OPERATIVE DIAGNOSIS: Same  PROCEDURE:  Procedure(s): DECANNULATION FOR ECMO (EXTRACORPOREAL MEMBRANE OXYGENATION); BILATERAL CHEST TUBE PLACEMENT; INSERTION OF TEMPORARY DIALYSIS CATHETER (N/A) TRANSESOPHAGEAL ECHOCARDIOGRAM (TEE) (N/A) CHEST TUBE INSERTION (Bilateral)  SURGEON:  Surgeon(s) and Role: Panel 1:    * Wonda Olds, MD - Primary Panel 2:    * Wonda Olds, MD - Primary  PHYSICIAN ASSISTANT: n/a  ASSISTANTS: staff ANESTHESIA:   general  EBL:  1500 mL   BLOOD ADMINISTERED:500 CC PRBC  DRAINS: 1 Chest Tube(s) in the each pleural space   LOCAL MEDICATIONS USED:  NONE  SPECIMEN:  No Specimen  DISPOSITION OF SPECIMEN:  N/A  COUNTS:  YES  TOURNIQUET:  * No tourniquets in log *  DICTATION: .Note written in EPIC  PLAN OF CARE: Admit to inpatient   PATIENT DISPOSITION:  ICU - intubated and hemodynamically stable.   Delay start of Pharmacological VTE agent (>24hrs) due to surgical blood loss or risk of bleeding: no  Neizan Debruhl Z. Orvan Seen, [place]

## 2019-02-01 NOTE — Plan of Care (Signed)
  Problem: Clinical Measurements: Goal: Ability to maintain clinical measurements within normal limits will improve Outcome: Progressing Goal: Respiratory complications will improve Outcome: Progressing Goal: Cardiovascular complication will be avoided Outcome: Progressing   Problem: Nutrition: Goal: Adequate nutrition will be maintained Outcome: Progressing   Problem: Coping: Goal: Level of anxiety will decrease Outcome: Progressing   Problem: Elimination: Goal: Will not experience complications related to bowel motility Outcome: Progressing Goal: Will not experience complications related to urinary retention Outcome: Progressing   Problem: Pain Managment: Goal: General experience of comfort will improve Outcome: Progressing   Problem: Safety: Goal: Ability to remain free from injury will improve Outcome: Progressing   Problem: Skin Integrity: Goal: Risk for impaired skin integrity will decrease Outcome: Progressing   Problem: Activity: Goal: Risk for activity intolerance will decrease Outcome: Not Progressing

## 2019-02-01 NOTE — Op Note (Signed)
Procedure(s): DECANNULATION FOR ECMO (EXTRACORPOREAL MEMBRANE OXYGENATION); BILATERAL CHEST TUBE PLACEMENT; INSERTION OF TEMPORARY DIALYSIS CATHETER TRANSESOPHAGEAL ECHOCARDIOGRAM (TEE) CHEST TUBE INSERTION Procedure Note  Philip Wolfe male 68 y.o. 02/06/2019  Procedure(s) and Anesthesia Type: Panel 1:    * DECANNULATION FOR ECMO (EXTRACORPOREAL MEMBRANE OXYGENATION); BILATERAL CHEST TUBE PLACEMENT; INSERTION OF TEMPORARY DIALYSIS CATHETER - General    * TRANSESOPHAGEAL ECHOCARDIOGRAM (TEE) - General  Panel 2:    * CHEST TUBE INSERTION - General  Surgeon(s) and Role: Panel 1:    * Wonda Olds, MD - Primary Panel 2:    Wonda Olds, MD - Primary   Indications: The patient has been on venoarterial ECMO support for confirmed diagnosis of pulmonary embolus.  After a few rounds of thrombolytics, the patient's right heart and hemodynamics appear improved to the point of no longer requiring extracorporeal support.  He is therefore taken to the operating room for decannulation.  In addition he has moderate sized pleural effusions bilaterally.     Surgeon: Wonda Olds   Assistants: Staff  Anesthesia: General endotracheal anesthesia  ASA Class: 4    Procedure Detail  DECANNULATION FOR ECMO (EXTRACORPOREAL MEMBRANE OXYGENATION); BILATERAL CHEST TUBE PLACEMENT; INSERTION OF TEMPORARY DIALYSIS CATHETER, TRANSESOPHAGEAL ECHOCARDIOGRAM (TEE) After informed consent was obtained from the patient's wife, he was taken to the operating room [facility] on 01/22/2019 and placed in the supine position on the operating table.  Anesthesia was begun in a smooth fashion with a general endotracheal technique.  After confirming adequate anesthesia the anterior neck chest abdomen groins and bilateral lower extremities to the level of the knees were prepped and draped a sterile field using Betadine solution.  A preop surgical pause was performed.  While the TEE was monitored  VA ECMO flows were reduced to 1 L.  Hemodynamics were stable and the RV appearance on the ECMO was near normal.  Therefore the 5000 units of heparin were given intravenously.  The venous and arterial limb of the ECMO circuit were each clamped.  The cannulas were withdrawn withdrawn from the respective vessels and the sites were secured surgically.  The patient tolerated this well but did require 2 units of blood given the expected loss of blood within the ECMO circuit.  Next bilateral chest tubes were performed by placing small incisions in approximately the midclavicular line on each side in the fifth intercostal space.  Serosanguineous fluid was returned from each chest cavity.  Finally attention was turned to the left infraclavicular fossa where a dialysis catheter was placed using a Seldinger technique.  This was then secured to the skin and the lines were flushed.  Dressings were applied as appropriate.  All sponge instrument needle counts were correct at the end of the case and I was present and participated in all aspects. Findings: Successful ECMO decannulation  Estimated Blood Loss:  Per anesthesia         Drains: JACKSON-PRATT (JP) right infraclavicular fossa         Total IV Fluids: Per anesthesia  Blood Given: 500 CC PRBC          Specimens: none         Implants: none        Complications:  * No complications entered in OR log *         Disposition: ICU - intubated and critically ill.         Condition: stable

## 2019-02-01 NOTE — Progress Notes (Signed)
Subjective: Pt is sedated, intubated. Off ECMO. Still anticoagulated. No bleeding.  Objective: Vital signs in last 24 hours: Temp:  [97.7 F (36.5 C)-99 F (37.2 C)] 99 F (37.2 C) (12/16 1700) Pulse Rate:  [81-105] 94 (12/16 1700) Resp:  [20-32] 31 (12/16 1700) BP: (95-127)/(44-57) 95/52 (12/16 1550) SpO2:  [93 %-99 %] 97 % (12/16 1715) Arterial Line BP: (90-135)/(48-60) 119/60 (12/16 1700) FiO2 (%):  [40 %-100 %] 60 % (12/16 1715) Weight:  [152.4 kg] 152.4 kg (12/16 0500)  Physical exam: General appearance:Morbidly obese andintubated. Sedated. Not responsive. Head:Normocephalic, without obvious abnormality, atraumatic Ears: Examination of the ears shows normal auricles and external auditory canals bilaterally.  Nose: Nasal packing in placebilaterally. Nobleeding. Face: Facial examination shows no asymmetry.  Mouth:ET tube in place.No more pharyngeal bleeding. Neck:Palpation of the neck reveals no lymphadenopathy or mass. The trachea is midline. The thyroid is not significantly enlarged.  Neuro:Sedated. Not responsive.  Recent Labs    02/12/2019 1214 01/22/2019 1608 02/06/2019 1707  WBC 20.2* 21.5*  --   HGB 8.6* 8.9* 8.5*  HCT 27.2* 27.4* 25.0*  PLT 96* 115*  --    Recent Labs    01/31/19 1634 01/29/2019 0500 01/21/2019 1223 02/12/2019 1707  NA 148* 144 148* 147*  K 4.3 4.1 4.4 4.3  CL 106 105  --   --   CO2 26 25  --   --   GLUCOSE 175* 259*  --   --   BUN 82* 89*  --   --   CREATININE 2.66* 2.75*  --   --   CALCIUM 7.6* 7.7*  --   --     Medications:  I have reviewed the patient's current medications. Scheduled: . sodium chloride   Intravenous Once  . sodium chloride   Intravenous Once  . bisacodyl  10 mg Oral Daily   Or  . bisacodyl  10 mg Rectal Daily  . chlorhexidine gluconate (MEDLINE KIT)  15 mL Mouth Rinse BID  . Chlorhexidine Gluconate Cloth  6 each Topical Daily  . docusate sodium  200 mg Oral Daily  . feeding supplement (PRO-STAT SUGAR FREE  64)  30 mL Per Tube TID  . insulin aspart  0-20 Units Subcutaneous Q4H  . insulin glargine  14 Units Subcutaneous Q24H  . mouth rinse  15 mL Mouth Rinse 10 times per day  . pantoprazole (PROTONIX) IV  40 mg Intravenous Q12H  . sildenafil  20 mg Per Tube TID  . sodium chloride flush  3 mL Intravenous Q12H   Continuous: .  prismasol BGK 4/2.5 500 mL/hr at 01/20/2019 1519  .  prismasol BGK 4/2.5 400 mL/hr at 02/15/2019 1519  . sodium chloride 10 mL/hr at 02/11/2019 0800  . angiotensin II (GIAPREZA) infusion 35 ng/kg/min (01/19/2019 1700)  . ceFEPime (MAXIPIME) IV    . feeding supplement (VITAL 1.5 CAL) Stopped (02/15/2019 0759)  . fentaNYL infusion INTRAVENOUS 200 mcg/hr (02/14/2019 1700)  . furosemide (LASIX) infusion Stopped (02/13/2019 1429)  . heparin 1,500 Units/hr (02/13/2019 1700)  . midazolam 2 mg/hr (01/26/2019 1727)  . norepinephrine (LEVOPHED) Adult infusion 40 mcg/min (02/06/2019 1700)  . prismasol BGK 4/2.5 2,000 mL/hr at 02/10/2019 1520    Assessment/Plan: Recent severe bilateralepistaxiss/p TPA treatment for massive pulmonary emboli.Bleeding is now under control. He is on 1500 U/hr of heparin. -Plan to remove the left nasal packing tomorrow. Will leave the right packing in place. -Continue antibiotic while packing is in place.    LOS: 9 days   Stevie Charter  Cassie Freer 01/19/2019, 5:32 PM

## 2019-02-01 NOTE — Consult Note (Addendum)
[facility] Nephrology Consultation Note  Requesting MD: Dr. Ina Homes Reason for consult: AKI, Anasarca   HPI:  Philip Wolfe is a 68 y.o. male who was admitted on 12/7 with massive pulmonary embolism status post thrombolytics and catheter directed lysis, had a cardiac arrest with significant right ventricular failure requiring ECMO since 12/8-12/16, seen as a consultation for AKI and anasarca. Patient received multiple CT angio with IV contrast.  Currently on Lasix IV drip with good urine output however patient remains fluid overloaded with anasarca and skin blister.  On mechanical ventilation for respiratory failure. The serum creatinine level was 1.5 on admission which was trended up to 2.75 today.  He is on multiple pressors including angiotensin, Levophed.  Epinephrine and vasopressin was discontinued.  On cefepime for leukocytosis. Patient is followed by pulmonary, cardiology, cardiothoracic surgery.  On IV heparin. Left subclavian HD catheter was placed in OR today.  We are now consulted to do CRRT for volume management.  Patient is sedated and intubated therefore most of the history obtained from the chart and medical staff's.   PMHx:   Past Medical History:  Diagnosis Date  . Arthritis   . Hypertension   . Sleep apnea   . Ulcer left medial ankle  . Venous stasis ulcer ([place])     Past Surgical History:  Procedure Laterality Date  . CANNULATION FOR ECMO (EXTRACORPOREAL MEMBRANE OXYGENATION) N/A 01/17/2019   Procedure: CANNULATION FOR VA ECMO (EXTRACORPOREAL MEMBRANE OXYGENATION);  Surgeon: Prescott Gum, Collier Salina, MD;  Location: [place];  Service: Open Heart Surgery;  Laterality: N/A;  C-ARM  . ENDOVENOUS ABLATION SAPHENOUS VEIN W/ LASER  11-05-2010  Left Greater Saphenous Vein  . GASTRIC BYPASS    . IR ANGIOGRAM PULMONARY BILATERAL SELECTIVE  02/14/2019  . IR ANGIOGRAM PULMONARY BILATERAL SELECTIVE  01/26/2019  . IR ANGIOGRAM SELECTIVE EACH ADDITIONAL VESSEL   02/09/2019  . IR ANGIOGRAM SELECTIVE EACH ADDITIONAL VESSEL  01/17/2019  . IR ANGIOGRAM SELECTIVE EACH ADDITIONAL VESSEL  01/26/2019  . IR ANGIOGRAM SELECTIVE EACH ADDITIONAL VESSEL  01/26/2019  . IR INFUSION THROMBOL ARTERIAL INITIAL (MS)  02/08/2019  . IR INFUSION THROMBOL ARTERIAL INITIAL (MS)  01/31/2019  . IR INFUSION THROMBOL ARTERIAL INITIAL (MS)  01/26/2019  . IR INFUSION THROMBOL ARTERIAL INITIAL (MS)  01/26/2019  . IR THROMB F/U EVAL ART/VEN FINAL DAY (MS)  01/25/2019  . IR THROMB F/U EVAL ART/VEN FINAL DAY (MS)  01/27/2019  . IR US GUIDE VASC ACCESS RIGHT  01/26/2019  . TEE WITHOUT CARDIOVERSION N/A 01/25/2019   Procedure: TRANSESOPHAGEAL ECHOCARDIOGRAM (TEE);  Surgeon: Prescott Gum, Collier Salina, MD;  Location: [place];  Service: Open Heart Surgery;  Laterality: N/A;  . varicose vein strippin g      Family Hx:  Family History  Problem Relation Age of Onset  . Hypertension Mother     Social History:  reports that he has never smoked. He has never used smokeless tobacco. He reports that he does not drink alcohol or use drugs.  Allergies: No Known Allergies  Medications: Prior to Admission medications   Medication Sig Start Date End Date Taking? Authorizing Provider  amLODipine (NORVASC) 5 MG tablet Take 5 mg by mouth daily. 11/24/18  Yes [provider]  fish oil-omega-3 fatty acids 1000 MG capsule Take 1 g by mouth daily.     Yes [provider]  losartan-hydrochlorothiazide (HYZAAR) 100-25 MG tablet Take 1 tablet by mouth daily. 10/31/18  Yes [provider]  meloxicam (MOBIC) 7.5 MG tablet  Take 7.5 mg by mouth daily. 01/16/19  Yes [provider]  nebivolol (BYSTOLIC) 10 MG tablet Take 10 mg by mouth daily.     Yes [provider]  spironolactone (ALDACTONE) 100 MG tablet Take 100 mg by mouth daily. 12/11/18  Yes [provider]    I have reviewed the patient's current medications.  Labs:  Results for orders placed or performed  during the hospital encounter of 01/19/2019 (from the past 48 hour(s))  Glucose, capillary     Status: Abnormal   Collection Time: 01/26/2019  3:26 PM  Result Value Ref Range   Glucose-Capillary 111 (H) 70 - 99 mg/dL  Comprehensive metabolic panel     Status: Abnormal   Collection Time: 01/18/2019  4:07 PM  Result Value Ref Range   Sodium 145 135 - 145 mmol/L   Potassium 4.4 3.5 - 5.1 mmol/L   Chloride 107 98 - 111 mmol/L   CO2 28 22 - 32 mmol/L   Glucose, Bld 184 (H) 70 - 99 mg/dL   BUN 65 (H) 8 - 23 mg/dL   Creatinine, Ser 2.06 (H) 0.61 - 1.24 mg/dL   Calcium 7.2 (L) 8.9 - 10.3 mg/dL   Total Protein 4.3 (L) 6.5 - 8.1 g/dL   Albumin 2.0 (L) 3.5 - 5.0 g/dL   AST 95 (H) 15 - 41 U/L   ALT 40 0 - 44 U/L   Alkaline Phosphatase 67 38 - 126 U/L   Total Bilirubin 2.0 (H) 0.3 - 1.2 mg/dL   GFR calc non Af Amer 32 (L) >60 mL/min   GFR calc Af Amer 38 (L) >60 mL/min   Anion gap 10 5 - 15    Comment: Performed at [facility], [address], [place], [place] [address]  Lactate dehydrogenase     Status: Abnormal   Collection Time: 01/31/2019  4:07 PM  Result Value Ref Range   LDH 446 (H) 98 - 192 U/L    Comment: Performed at [facility], [place] [address] [place], [place] [address]  CBC     Status: Abnormal   Collection Time: 02/08/2019  4:07 PM  Result Value Ref Range   WBC 24.1 (H) 4.0 - 10.5 K/uL   RBC 2.68 (L) 4.22 - 5.81 MIL/uL   Hemoglobin 8.4 (L) 13.0 - 17.0 g/dL   HCT 25.2 (L) 39.0 - 52.0 %   MCV 94.0 80.0 - 100.0 fL   MCH 31.3 26.0 - 34.0 pg   MCHC 33.3 30.0 - 36.0 g/dL   RDW 15.3 11.5 - 15.5 %   Platelets 80 (L) 150 - 400 K/uL    Comment: REPEATED TO VERIFY Immature Platelet Fraction may be clinically indicated, consider ordering this additional test [id number] CONSISTENT WITH PREVIOUS RESULT    nRBC 11.8 (H) 0.0 - 0.2 %    Comment: Performed at [facility], [place] [address] [place], [place] [address]  Heparin level (unfractionated)     Status: None    Collection Time: 01/31/2019  4:07 PM  Result Value Ref Range   Heparin Unfractionated 0.41 0.30 - 0.70 IU/mL    Comment: (NOTE) If heparin results are below expected values, and patient dosage has  been confirmed, suggest follow up testing of antithrombin III levels. Performed at [facility], [place] [address], [place], [place] [address]   I-STAT 7, (LYTES, BLD GAS, ICA, H+H)     Status: Abnormal   Collection Time: 02/08/2019  4:26 PM  Result Value Ref Range   pH, Arterial 7.425 7.350 - 7.450   pCO2 arterial 45.8 32.0 - 48.0 mmHg   pO2, Arterial 63.0 (L) 83.0 - 108.0 mmHg   Bicarbonate 30.1 (H) 20.0 - 28.0 mmol/L   TCO2 32 22 - 32 mmol/L   O2 Saturation 92.0 %   Acid-Base Excess 5.0 (H) 0.0 - 2.0 mmol/L   Sodium 145 135 - 145 mmol/L   Potassium 4.4 3.5 - 5.1 mmol/L   Calcium, Ion 1.03 (L) 1.15 - 1.40 mmol/L   HCT 25.0 (L) 39.0 - 52.0 %   Hemoglobin 8.5 (L) 13.0 - 17.0 g/dL   Patient temperature 36.8 C    Collection site ARTERIAL LINE    Drawn by Nurse    Sample type CARDIOPULMONARY BYPASS   Glucose, capillary     Status: Abnormal   Collection Time: 02/13/2019  7:41 PM  Result Value Ref Range   Glucose-Capillary 174 (H) 70 - 99 mg/dL  CBC     Status: Abnormal   Collection Time: 01/22/2019  8:44 PM  Result Value Ref Range   WBC 24.4 (H) 4.0 - 10.5 K/uL   RBC 2.65 (L) 4.22 - 5.81 MIL/uL   Hemoglobin 8.4 (L) 13.0 - 17.0 g/dL   HCT 24.7 (L) 39.0 - 52.0 %   MCV 93.2 80.0 - 100.0 fL   MCH 31.7 26.0 - 34.0 pg   MCHC 34.0 30.0 - 36.0 g/dL   RDW 16.0 (H) 11.5 - 15.5 %   Platelets 81 (L) 150 - 400 K/uL    Comment: REPEATED TO VERIFY SPECIMEN CHECKED FOR CLOTS Immature Platelet Fraction may be clinically indicated, consider ordering this additional test [id number] CONSISTENT WITH PREVIOUS RESULT    nRBC 11.5 (H) 0.0 - 0.2 %    Comment: Performed at [facility], [address] [place], [place] [address]  Protime-INR     Status: Abnormal   Collection Time: 02/06/2019  8:44 PM   Result Value Ref Range   Prothrombin Time 16.5 (H) 11.4 - 15.2 seconds   INR 1.3 (H) 0.8 - 1.2    Comment: (NOTE) INR goal varies based on device and disease states. Performed at [facility], [place] [address] [place], [place] [address]   I-STAT 7, (LYTES, BLD GAS, ICA, H+H)     Status: Abnormal   Collection Time: 02/08/2019  8:58 PM  Result Value Ref Range   pH, Arterial 7.444 7.350 - 7.450   pCO2 arterial 41.6 32.0 - 48.0 mmHg   pO2, Arterial 64.0 (L) 83.0 - 108.0 mmHg   Bicarbonate 28.5 (H) 20.0 - 28.0 mmol/L   TCO2 30 22 - 32 mmol/L   O2 Saturation 93.0 %   Acid-Base Excess 4.0 (H) 0.0 - 2.0 mmol/L   Sodium 144 135 - 145 mmol/L   Potassium 4.3 3.5 - 5.1 mmol/L   Calcium, Ion 1.04 (L) 1.15 - 1.40 mmol/L   HCT 22.0 (L) 39.0 - 52.0 %   Hemoglobin 7.5 (L) 13.0 - 17.0 g/dL   Patient temperature 36.9 C    Collection site ARTERIAL LINE    Drawn by RT    Sample type ARTERIAL   Prepare RBC     Status: None   Collection Time: 02/16/2019  9:01 PM  Result Value Ref Range   Order Confirmation      ORDER PROCESSED BY BLOOD BANK Performed at [facility], [address], [place], [place] [address]   I-STAT 7, (LYTES, BLD  GAS, ICA, H+H)     Status: Abnormal   Collection Time: 01/29/2019  9:12 PM  Result Value Ref Range   pH, Arterial 7.472 (H) 7.350 - 7.450   pCO2 arterial 44.5 32.0 - 48.0 mmHg   pO2, Arterial 522.0 (H) 83.0 - 108.0 mmHg   Bicarbonate 32.5 (H) 20.0 - 28.0 mmol/L   TCO2 34 (H) 22 - 32 mmol/L   O2 Saturation 100.0 %   Acid-Base Excess 8.0 (H) 0.0 - 2.0 mmol/L   Sodium 143 135 - 145 mmol/L   Potassium 4.3 3.5 - 5.1 mmol/L   Calcium, Ion 1.03 (L) 1.15 - 1.40 mmol/L   HCT 22.0 (L) 39.0 - 52.0 %   Hemoglobin 7.5 (L) 13.0 - 17.0 g/dL   Patient temperature 98.6 F    Sample type ARTERIAL   Glucose, capillary     Status: Abnormal   Collection Time: 02/05/2019 11:23 PM  Result Value Ref Range   Glucose-Capillary 174 (H) 70 - 99 mg/dL  I-STAT 7, (LYTES, BLD GAS,  ICA, H+H)     Status: Abnormal   Collection Time: 01/31/19 12:14 AM  Result Value Ref Range   pH, Arterial 7.439 7.350 - 7.450   pCO2 arterial 43.7 32.0 - 48.0 mmHg   pO2, Arterial 104.0 83.0 - 108.0 mmHg   Bicarbonate 29.6 (H) 20.0 - 28.0 mmol/L   TCO2 31 22 - 32 mmol/L   O2 Saturation 98.0 %   Acid-Base Excess 5.0 (H) 0.0 - 2.0 mmol/L   Sodium 145 135 - 145 mmol/L   Potassium 4.3 3.5 - 5.1 mmol/L   Calcium, Ion 1.04 (L) 1.15 - 1.40 mmol/L   HCT 22.0 (L) 39.0 - 52.0 %   Hemoglobin 7.5 (L) 13.0 - 17.0 g/dL   Patient temperature 36.9 C    Sample type ARTERIAL   .Cooxemetry Panel (carboxy, met, total hgb, O2 sat)     Status: Abnormal   Collection Time: 01/31/19  3:07 AM  Result Value Ref Range   Total hemoglobin 9.5 (L) 12.0 - 16.0 g/dL   O2 Saturation 79.6 %   Carboxyhemoglobin 2.1 (H) 0.5 - 1.5 %   Methemoglobin 1.5 0.0 - 1.5 %    Comment: Performed at [facility] [address] [place], [place] [address]  Glucose, capillary     Status: Abnormal   Collection Time: 01/31/19  3:18 AM  Result Value Ref Range   Glucose-Capillary 187 (H) 70 - 99 mg/dL  I-STAT 7, (LYTES, BLD GAS, ICA, H+H)     Status: Abnormal   Collection Time: 01/31/19  3:20 AM  Result Value Ref Range   pH, Arterial 7.431 7.350 - 7.450   pCO2 arterial 46.0 32.0 - 48.0 mmHg   pO2, Arterial 93.0 83.0 - 108.0 mmHg   Bicarbonate 30.7 (H) 20.0 - 28.0 mmol/L   TCO2 32 22 - 32 mmol/L   O2 Saturation 97.0 %   Acid-Base Excess 6.0 (H) 0.0 - 2.0 mmol/L   Sodium 145 135 - 145 mmol/L   Potassium 4.4 3.5 - 5.1 mmol/L   Calcium, Ion 1.05 (L) 1.15 - 1.40 mmol/L   HCT 23.0 (L) 39.0 - 52.0 %   Hemoglobin 7.8 (L) 13.0 - 17.0 g/dL   Patient temperature 36.7 C    Sample type ARTERIAL   Comprehensive metabolic panel     Status: Abnormal   Collection Time: 01/31/19  3:31 AM  Result Value Ref Range   Sodium 144 135 - 145 mmol/L   Potassium  4.4 3.5 - 5.1 mmol/L   Chloride 107 98 - 111 mmol/L   CO2 26 22 - 32 mmol/L    Glucose, Bld 185 (H) 70 - 99 mg/dL   BUN 73 (H) 8 - 23 mg/dL   Creatinine, Ser 2.42 (H) 0.61 - 1.24 mg/dL   Calcium 7.5 (L) 8.9 - 10.3 mg/dL   Total Protein 4.5 (L) 6.5 - 8.1 g/dL   Albumin 2.1 (L) 3.5 - 5.0 g/dL   AST 98 (H) 15 - 41 U/L   ALT 42 0 - 44 U/L   Alkaline Phosphatase 71 38 - 126 U/L   Total Bilirubin 2.4 (H) 0.3 - 1.2 mg/dL   GFR calc non Af Amer 27 (L) >60 mL/min   GFR calc Af Amer 31 (L) >60 mL/min   Anion gap 11 5 - 15    Comment: Performed at [facility], [address] [place], [place] [address]  Lactate dehydrogenase     Status: Abnormal   Collection Time: 01/31/19  3:31 AM  Result Value Ref Range   LDH 447 (H) 98 - 192 U/L    Comment: Performed at [facility], [place] [address], [place], [place] [address]  Protime-INR     Status: Abnormal   Collection Time: 01/31/19  3:31 AM  Result Value Ref Range   Prothrombin Time 16.9 (H) 11.4 - 15.2 seconds   INR 1.4 (H) 0.8 - 1.2    Comment: (NOTE) INR goal varies based on device and disease states. Performed at [facility], [place] [address] [place], [place] [address]   Magnesium     Status: Abnormal   Collection Time: 01/31/19  3:31 AM  Result Value Ref Range   Magnesium 2.5 (H) 1.7 - 2.4 mg/dL    Comment: Performed at [facility] [address], [place], [place] [address]  CBC     Status: Abnormal   Collection Time: 01/31/19  3:31 AM  Result Value Ref Range   WBC 22.2 (H) 4.0 - 10.5 K/uL   RBC 2.53 (L) 4.22 - 5.81 MIL/uL   Hemoglobin 7.9 (L) 13.0 - 17.0 g/dL   HCT 23.6 (L) 39.0 - 52.0 %   MCV 93.3 80.0 - 100.0 fL   MCH 31.2 26.0 - 34.0 pg   MCHC 33.5 30.0 - 36.0 g/dL   RDW 16.8 (H) 11.5 - 15.5 %   Platelets 90 (L) 150 - 400 K/uL    Comment: REPEATED TO VERIFY SPECIMEN CHECKED FOR CLOTS Immature Platelet Fraction may be clinically indicated, consider ordering this additional test [id number] CONSISTENT WITH PREVIOUS RESULT    nRBC 11.0 (H) 0.0 - 0.2 %    Comment: Performed at  [facility], [place] [address] [place], [place] [address]  APTT     Status: Abnormal   Collection Time: 01/31/19  3:31 AM  Result Value Ref Range   aPTT 84 (H) 24 - 36 seconds    Comment:        IF BASELINE aPTT IS ELEVATED, SUGGEST PATIENT RISK ASSESSMENT BE USED TO DETERMINE APPROPRIATE ANTICOAGULANT THERAPY. Performed at [facility], [place] [address] [place], [place] [address]   Lactic acid, plasma     Status: None   Collection Time: 01/31/19  3:32 AM  Result Value Ref Range   Lactic Acid, Venous 1.4 0.5 - 1.9 mmol/L    Comment: Performed at [facility] [address], [place],  [address]  Heparin level (unfractionated)     Status: None   Collection Time: 01/31/19  3:32 AM  Result Value Ref Range   Heparin Unfractionated 0.35 0.30 - 0.70 IU/mL    Comment: (NOTE) If heparin results are below expected values, and patient dosage has  been confirmed, suggest follow up testing of antithrombin III levels. Performed at [facility], [place] [address], [place], [place] [address]   Glucose, capillary     Status: Abnormal   Collection Time: 01/31/19  7:36 AM  Result Value Ref Range   Glucose-Capillary 199 (H) 70 - 99 mg/dL  Cortisol     Status: None   Collection Time: 01/31/19  7:40 AM  Result Value Ref Range   Cortisol, Plasma 35.1 ug/dL    Comment: (NOTE) AM    6.7 - 22.6 ug/dL PM   <10.0       ug/dL Performed at [facility] [address], [place], [place] [address]   CBC     Status: Abnormal   Collection Time: 01/31/19 10:12 AM  Result Value Ref Range   WBC 17.1 (H) 4.0 - 10.5 K/uL   RBC 2.59 (L) 4.22 - 5.81 MIL/uL   Hemoglobin 7.9 (L) 13.0 - 17.0 g/dL   HCT 24.3 (L) 39.0 - 52.0 %   MCV 93.8 80.0 - 100.0 fL   MCH 30.5 26.0 - 34.0 pg   MCHC 32.5 30.0 - 36.0 g/dL   RDW 16.8 (H) 11.5 - 15.5 %   Platelets 81 (L) 150 - 400 K/uL    Comment: REPEATED TO VERIFY Immature Platelet Fraction may be clinically indicated, consider ordering  this additional test [id number] CONSISTENT WITH PREVIOUS RESULT    nRBC 8.2 (H) 0.0 - 0.2 %    Comment: Performed at [facility], [place] [address] [place], [place] [address]  Prepare RBC     Status: None   Collection Time: 01/31/19 11:22 AM  Result Value Ref Range   Order Confirmation      ORDER PROCESSED BY BLOOD BANK Performed at [facility], [place] [address], [place], [place] [address]   Glucose, capillary     Status: Abnormal   Collection Time: 01/31/19 12:02 PM  Result Value Ref Range   Glucose-Capillary 165 (H) 70 - 99 mg/dL  I-STAT 7, (LYTES, BLD GAS, ICA, H+H)     Status: Abnormal   Collection Time: 01/31/19 12:05 PM  Result Value Ref Range   pH, Arterial 7.439 7.350 - 7.450   pCO2 arterial 40.7 32.0 - 48.0 mmHg   pO2, Arterial 78.0 (L) 83.0 - 108.0 mmHg   Bicarbonate 27.7 20.0 - 28.0 mmol/L   TCO2 29 22 - 32 mmol/L   O2 Saturation 96.0 %   Acid-Base Excess 3.0 (H) 0.0 - 2.0 mmol/L   Sodium 146 (H) 135 - 145 mmol/L   Potassium 4.1 3.5 - 5.1 mmol/L   Calcium, Ion 1.08 (L) 1.15 - 1.40 mmol/L   HCT 23.0 (L) 39.0 - 52.0 %   Hemoglobin 7.8 (L) 13.0 - 17.0 g/dL   Patient temperature 36.6 C    Collection site ARTERIAL LINE    Drawn by Operator    Sample type ARTERIAL   Basic metabolic panel     Status: Abnormal   Collection Time: 01/31/19  2:50 PM  Result Value Ref Range   Sodium 146 (H) 135 - 145 mmol/L   Potassium 4.1 3.5 - 5.1 mmol/L   Chloride 107 98 - 111  mmol/L   CO2 26 22 - 32 mmol/L   Glucose, Bld 168 (H) 70 - 99 mg/dL   BUN 82 (H) 8 - 23 mg/dL   Creatinine, Ser 2.59 (H) 0.61 - 1.24 mg/dL   Calcium 7.7 (L) 8.9 - 10.3 mg/dL   GFR calc non Af Amer 25 (L) >60 mL/min   GFR calc Af Amer 28 (L) >60 mL/min   Anion gap 13 5 - 15    Comment: Performed at [facility] [address] [place], [place] [address]  Comprehensive metabolic panel     Status: Abnormal   Collection Time: 01/31/19  4:34 PM  Result Value Ref Range   Sodium 148 (H) 135 - 145  mmol/L   Potassium 4.3 3.5 - 5.1 mmol/L   Chloride 106 98 - 111 mmol/L   CO2 26 22 - 32 mmol/L   Glucose, Bld 175 (H) 70 - 99 mg/dL   BUN 82 (H) 8 - 23 mg/dL   Creatinine, Ser 2.66 (H) 0.61 - 1.24 mg/dL   Calcium 7.6 (L) 8.9 - 10.3 mg/dL   Total Protein 4.6 (L) 6.5 - 8.1 g/dL   Albumin 2.2 (L) 3.5 - 5.0 g/dL   AST 101 (H) 15 - 41 U/L   ALT 43 0 - 44 U/L   Alkaline Phosphatase 99 38 - 126 U/L   Total Bilirubin 2.0 (H) 0.3 - 1.2 mg/dL   GFR calc non Af Amer 24 (L) >60 mL/min   GFR calc Af Amer 28 (L) >60 mL/min   Anion gap 16 (H) 5 - 15    Comment: Performed at [facility], [place] [address] [place], [place] [address]  Lactate dehydrogenase     Status: Abnormal   Collection Time: 01/31/19  4:34 PM  Result Value Ref Range   LDH 414 (H) 98 - 192 U/L    Comment: Performed at [facility], [place] [address], [place], [place] [address]  CBC     Status: Abnormal   Collection Time: 01/31/19  4:34 PM  Result Value Ref Range   WBC 15.7 (H) 4.0 - 10.5 K/uL   RBC 2.73 (L) 4.22 - 5.81 MIL/uL   Hemoglobin 8.4 (L) 13.0 - 17.0 g/dL   HCT 26.0 (L) 39.0 - 52.0 %   MCV 95.2 80.0 - 100.0 fL   MCH 30.8 26.0 - 34.0 pg   MCHC 32.3 30.0 - 36.0 g/dL   RDW 16.9 (H) 11.5 - 15.5 %   Platelets 79 (L) 150 - 400 K/uL    Comment: REPEATED TO VERIFY Immature Platelet Fraction may be clinically indicated, consider ordering this additional test [id number] CONSISTENT WITH PREVIOUS RESULT    nRBC 6.3 (H) 0.0 - 0.2 %    Comment: Performed at [facility], [place] [address] [place], [place] [address]  Heparin level (unfractionated)     Status: None   Collection Time: 01/31/19  4:35 PM  Result Value Ref Range   Heparin Unfractionated 0.30 0.30 - 0.70 IU/mL    Comment: (NOTE) If heparin results are below expected values, and patient dosage has  been confirmed, suggest follow up testing of antithrombin III levels. Performed at [facility], [place] [address], [place], [place] [address]    Glucose, capillary     Status: Abnormal   Collection Time: 01/31/19  4:39 PM  Result Value Ref Range   Glucose-Capillary 166 (H) 70 - 99 mg/dL  I-STAT 7, (LYTES, BLD GAS, ICA,  H+H)     Status: Abnormal   Collection Time: 01/31/19  5:11 PM  Result Value Ref Range   pH, Arterial 7.432 7.350 - 7.450   pCO2 arterial 40.2 32.0 - 48.0 mmHg   pO2, Arterial 68.0 (L) 83.0 - 108.0 mmHg   Bicarbonate 26.8 20.0 - 28.0 mmol/L   TCO2 28 22 - 32 mmol/L   O2 Saturation 94.0 %   Acid-Base Excess 2.0 0.0 - 2.0 mmol/L   Sodium 147 (H) 135 - 145 mmol/L   Potassium 3.9 3.5 - 5.1 mmol/L   Calcium, Ion 1.04 (L) 1.15 - 1.40 mmol/L   HCT 21.0 (L) 39.0 - 52.0 %   Hemoglobin 7.1 (L) 13.0 - 17.0 g/dL   Patient temperature HIDE    Sample type ARTERIAL   Glucose, capillary     Status: Abnormal   Collection Time: 01/31/19  8:12 PM  Result Value Ref Range   Glucose-Capillary 146 (H) 70 - 99 mg/dL  CBC     Status: Abnormal   Collection Time: 01/31/19 11:07 PM  Result Value Ref Range   WBC 19.0 (H) 4.0 - 10.5 K/uL   RBC 2.75 (L) 4.22 - 5.81 MIL/uL   Hemoglobin 8.5 (L) 13.0 - 17.0 g/dL   HCT 26.0 (L) 39.0 - 52.0 %   MCV 94.5 80.0 - 100.0 fL   MCH 30.9 26.0 - 34.0 pg   MCHC 32.7 30.0 - 36.0 g/dL   RDW 17.3 (H) 11.5 - 15.5 %   Platelets 96 (L) 150 - 400 K/uL    Comment: REPEATED TO VERIFY SPECIMEN CHECKED FOR CLOTS Immature Platelet Fraction may be clinically indicated, consider ordering this additional test [id number] CONSISTENT WITH PREVIOUS RESULT    nRBC 5.5 (H) 0.0 - 0.2 %    Comment: Performed at [facility], [address] [place], [place] [address]  Heparin level (unfractionated)     Status: Abnormal   Collection Time: 01/31/19 11:07 PM  Result Value Ref Range   Heparin Unfractionated 0.24 (L) 0.30 - 0.70 IU/mL    Comment: (NOTE) If heparin results are below expected values, and patient dosage has  been confirmed, suggest follow up testing of antithrombin III levels. Performed at  [facility], [place] [address] [place], [place] [address]   Protime-INR     Status: Abnormal   Collection Time: 01/31/19 11:07 PM  Result Value Ref Range   Prothrombin Time 16.4 (H) 11.4 - 15.2 seconds   INR 1.3 (H) 0.8 - 1.2    Comment: (NOTE) INR goal varies based on device and disease states. Performed at [facility], [place] [address], [place], [place] [address]   APTT     Status: Abnormal   Collection Time: 01/31/19 11:07 PM  Result Value Ref Range   aPTT 62 (H) 24 - 36 seconds    Comment:        IF BASELINE aPTT IS ELEVATED, SUGGEST PATIENT RISK ASSESSMENT BE USED TO DETERMINE APPROPRIATE ANTICOAGULANT THERAPY. Performed at [facility], [place] [address], [place], [place] [address]   Glucose, capillary     Status: Abnormal   Collection Time: 01/31/19 11:15 PM  Result Value Ref Range   Glucose-Capillary 159 (H) 70 - 99 mg/dL  .Cooxemetry Panel (carboxy, met, total hgb, O2 sat)     Status: Abnormal   Collection Time: 01/29/2019  4:59 AM  Result Value Ref Range   Total hemoglobin 11.8 (L) 12.0 - 16.0 g/dL   O2  Saturation 77.7 %   Carboxyhemoglobin 2.1 (H) 0.5 - 1.5 %   Methemoglobin 1.2 0.0 - 1.5 %    Comment: Performed at [facility] [address] [place], [place] [address]  Comprehensive metabolic panel     Status: Abnormal   Collection Time: 01/22/2019  5:00 AM  Result Value Ref Range   Sodium 144 135 - 145 mmol/L   Potassium 4.1 3.5 - 5.1 mmol/L   Chloride 105 98 - 111 mmol/L   CO2 25 22 - 32 mmol/L   Glucose, Bld 259 (H) 70 - 99 mg/dL   BUN 89 (H) 8 - 23 mg/dL   Creatinine, Ser 2.75 (H) 0.61 - 1.24 mg/dL   Calcium 7.7 (L) 8.9 - 10.3 mg/dL   Total Protein 4.5 (L) 6.5 - 8.1 g/dL   Albumin 2.0 (L) 3.5 - 5.0 g/dL   AST 102 (H) 15 - 41 U/L   ALT 46 (H) 0 - 44 U/L   Alkaline Phosphatase 102 38 - 126 U/L   Total Bilirubin 1.5 (H) 0.3 - 1.2 mg/dL   GFR calc non Af Amer 23 (L) >60 mL/min   GFR calc Af Amer 26 (L) >60 mL/min   Anion gap 14 5 -  15    Comment: Performed at [facility], [place] [address], [place], [place] [address]  Lactate dehydrogenase     Status: Abnormal   Collection Time: 01/28/2019  5:00 AM  Result Value Ref Range   LDH 400 (H) 98 - 192 U/L    Comment: Performed at [facility], [place] [address], [place], [place] [address]  Protime-INR     Status: Abnormal   Collection Time: 01/19/2019  5:00 AM  Result Value Ref Range   Prothrombin Time 16.7 (H) 11.4 - 15.2 seconds   INR 1.4 (H) 0.8 - 1.2    Comment: (NOTE) INR goal varies based on device and disease states. Performed at [facility], [place] [address], [place], [place] [address]   Magnesium     Status: None   Collection Time: 02/08/2019  5:00 AM  Result Value Ref Range   Magnesium 2.3 1.7 - 2.4 mg/dL    Comment: Performed at [facility] [facility], [place] [address], [place], [place] [address]  CBC     Status: Abnormal   Collection Time: 02/04/2019  5:00 AM  Result Value Ref Range   WBC 18.6 (H) 4.0 - 10.5 K/uL   RBC 2.57 (L) 4.22 - 5.81 MIL/uL   Hemoglobin 8.0 (L) 13.0 - 17.0 g/dL   HCT 24.6 (L) 39.0 - 52.0 %   MCV 95.7 80.0 - 100.0 fL   MCH 31.1 26.0 - 34.0 pg   MCHC 32.5 30.0 - 36.0 g/dL   RDW 17.6 (H) 11.5 - 15.5 %   Platelets 97 (L) 150 - 400 K/uL    Comment: REPEATED TO VERIFY SPECIMEN CHECKED FOR CLOTS Immature Platelet Fraction may be clinically indicated, consider ordering this additional test [id number] CONSISTENT WITH PREVIOUS RESULT    nRBC 4.0 (H) 0.0 - 0.2 %    Comment: Performed at [facility], [place] [address], [place], [place] [address]  APTT     Status: Abnormal   Collection Time: 02/16/2019  5:00 AM  Result Value Ref Range   aPTT 57 (H) 24 - 36 seconds    Comment:        IF BASELINE aPTT IS ELEVATED, SUGGEST PATIENT RISK ASSESSMENT BE USED  TO DETERMINE APPROPRIATE ANTICOAGULANT THERAPY. Performed at [facility], [place] [address] [place], [place] [address]   Glucose, capillary     Status: Abnormal    Collection Time: 01/31/2019  5:09 AM  Result Value Ref Range   Glucose-Capillary 181 (H) 70 - 99 mg/dL  I-STAT 7, (LYTES, BLD GAS, ICA, H+H)     Status: Abnormal   Collection Time: 01/29/2019  5:10 AM  Result Value Ref Range   pH, Arterial 7.444 7.350 - 7.450   pCO2 arterial 40.4 32.0 - 48.0 mmHg   pO2, Arterial 85.0 83.0 - 108.0 mmHg   Bicarbonate 27.7 20.0 - 28.0 mmol/L   TCO2 29 22 - 32 mmol/L   O2 Saturation 97.0 %   Acid-Base Excess 3.0 (H) 0.0 - 2.0 mmol/L   Sodium 145 135 - 145 mmol/L   Potassium 4.0 3.5 - 5.1 mmol/L   Calcium, Ion 1.15 1.15 - 1.40 mmol/L   HCT 20.0 (L) 39.0 - 52.0 %   Hemoglobin 6.8 (LL) 13.0 - 17.0 g/dL   Patient temperature 37.0 C    Collection site ARTERIAL LINE    Drawn by RT    Sample type ARTERIAL    Comment NOTIFIED PHYSICIAN   Lactic acid, plasma     Status: None   Collection Time: 02/04/2019  5:36 AM  Result Value Ref Range   Lactic Acid, Venous 1.5 0.5 - 1.9 mmol/L    Comment: Performed at [facility], [address] [place], [place] [address]  I-STAT 7, (LYTES, BLD GAS, ICA, H+H)     Status: Abnormal   Collection Time: 01/19/2019  6:24 AM  Result Value Ref Range   pH, Arterial 7.437 7.350 - 7.450   pCO2 arterial 37.0 32.0 - 48.0 mmHg   pO2, Arterial 81.0 (L) 83.0 - 108.0 mmHg   Bicarbonate 25.0 20.0 - 28.0 mmol/L   TCO2 26 22 - 32 mmol/L   O2 Saturation 96.0 %   Acid-Base Excess 1.0 0.0 - 2.0 mmol/L   Sodium 148 (H) 135 - 145 mmol/L   Potassium 3.8 3.5 - 5.1 mmol/L   Calcium, Ion 1.08 (L) 1.15 - 1.40 mmol/L   HCT 20.0 (L) 39.0 - 52.0 %   Hemoglobin 6.8 (LL) 13.0 - 17.0 g/dL   Patient temperature 37.0 C    Collection site ARTERIAL LINE    Drawn by RT    Sample type ARTERIAL    Comment NOTIFIED PHYSICIAN   Glucose, capillary     Status: Abnormal   Collection Time: 02/02/2019  7:43 AM  Result Value Ref Range   Glucose-Capillary 173 (H) 70 - 99 mg/dL  Heparin level     Status: Abnormal   Collection Time: 01/19/2019  8:08 AM  Result  Value Ref Range   Heparin Unfractionated 0.27 (L) 0.30 - 0.70 IU/mL    Comment: (NOTE) If heparin results are below expected values, and patient dosage has  been confirmed, suggest follow up testing of antithrombin III levels. Performed at [facility], [place] [address] [place], [place] [address]   Prepare RBC     Status: None   Collection Time: 02/13/2019  8:13 AM  Result Value Ref Range   Order Confirmation      ORDER PROCESSED BY BLOOD BANK Performed at [facility], [place] [address] [place], [place] [address]   CBC     Status: Abnormal   Collection Time: 01/31/2019 12:14 PM  Result Value Ref Range  WBC 20.2 (H) 4.0 - 10.5 K/uL   RBC 2.82 (L) 4.22 - 5.81 MIL/uL   Hemoglobin 8.6 (L) 13.0 - 17.0 g/dL   HCT 27.2 (L) 39.0 - 52.0 %   MCV 96.5 80.0 - 100.0 fL   MCH 30.5 26.0 - 34.0 pg   MCHC 31.6 30.0 - 36.0 g/dL   RDW 16.6 (H) 11.5 - 15.5 %   Platelets 96 (L) 150 - 400 K/uL    Comment: REPEATED TO VERIFY Immature Platelet Fraction may be clinically indicated, consider ordering this additional test [id number] CONSISTENT WITH PREVIOUS RESULT    nRBC 3.0 (H) 0.0 - 0.2 %    Comment: Performed at [facility], [place] [address], [place], [place] [address]     ROS:  Pertinent items noted in HPI and remainder of comprehensive ROS otherwise negative.  Physical Exam: Vitals:   02/05/2019 1300 01/25/2019 1400  BP:    Pulse: 90 81  Resp: (!) 25 (!) 26  Temp: 97.9 F (36.6 C) 98.2 F (36.8 C)  SpO2: 94% 99%     General exam: Intubated, sedated, on mechanical ventilation, has anasarca Respiratory system: Coarse breath sound bilateral  cardiovascular system: S1 & S2 heard, RRR.  Diffuse edema. Gastrointestinal system: Abdomen is distended, soft  Central nervous system: Sedated Extremities: Bilateral lower extremity pitting edema, anasarca Skin: Started having a skin blister with fluid overload Psychiatry: Sedated.  Assessment/Plan:  #Acute kidney injury,  nonoliguric: Multifactorial etiology including contrast nephropathy, cardiogenic shock/arrest. -CT scan showed no hydronephrosis however has exophytic lesion on right kidneys. -Starting CRRT for fluid overload to IV Lasix. -I have discussed with the patient's wife regarding need for dialysis/CRRT and plan of care. -Left subclavian was already placed by surgeon on 12/16. -CRRT with 4K bath.  On IV heparin for systemic anticoagulation. -Monitor electrolytes, potassium, calcium.  #Anasarca: Failed IV diuretics.  Try to attempt UF 150- 200 cc an hour.  Discontinue Lasix.  Discussed with ICU nurse.  #Cardiogenic shock: On multiple pressors.  Maintain acceptable MAP.  #Massive pulmonary embolism with cor pulmonale: Status post TPA and thrombectomy.  On IV heparin.  As per pulmonary team.  Thank you for the consult.  Will follow with you.   Crystol Walpole Tanna Furry 01/31/2019, 2:38 PM  [facility].

## 2019-02-01 NOTE — Progress Notes (Signed)
eLink Physician-Brief Progress Note Patient Name: Philip Wolfe DOB: 01-17-1951 MRN: [id number]   Date of Service  02-19-19  HPI/Events of Note  Hypotension - BP = 95/44 with MAP = 55 by A-line. CVP = 8-10. Albumin = 2.2 Hgb = 8.5.   eICU Interventions  Will order: 1. Hold fluid pull on CRRT. 2. Bolus with 5% albumin 12.5 gm IV now.  3. Wean sedation as tolerated.      Intervention Category Major Interventions: Hypotension - evaluation and management  Philip Wolfe 19-Feb-2019, 10:48 PM

## 2019-02-01 NOTE — Progress Notes (Signed)
ANTICOAGULATION CONSULT NOTE   Pharmacy Consult for Heparin  Indication: pulmonary embolus + ECMO  No Known Allergies  Patient Measurements: Height: 5\' 9"  (175.3 cm) Weight: (!) 335 lb 15.7 oz (152.4 kg) IBW/kg (Calculated) : 70.7 Heparin Dosing Weight: 103 kg  Vital Signs: Temp: 97.7 F (36.5 C) (12/16 1200) Temp Source: Bladder (12/16 1200) BP: 98/44 (12/16 1140) Pulse Rate: 93 (12/16 1244)  Labs: Recent Labs    01/31/19 0331 01/31/19 0332 01/31/19 1012 01/31/19 1450 01/31/19 1634 01/31/19 1635 01/31/19 2307 02/14/2019 0500 02/06/2019 0510 01/18/2019 0624 02/13/2019 0808 01/17/2019 1214  HGB 7.9*   < >   < >  --  8.4*  --  8.5* 8.0* 6.8* 6.8*  --  8.6*  HCT 23.6*   < >   < >  --  26.0*  --  26.0* 24.6* 20.0* 20.0*  --  27.2*  PLT 90*   < >  --   --  79*  --  96* 97*  --   --   --  96*  APTT 84*  --   --   --   --   --  62* 57*  --   --   --   --   LABPROT 16.9*  --   --   --   --   --  16.4* 16.7*  --   --   --   --   INR 1.4*  --   --   --   --   --  1.3* 1.4*  --   --   --   --   HEPARINUNFRC  --   --   --   --   --  0.30 0.24*  --   --   --  0.27*  --   CREATININE 2.42*  --   --  2.59* 2.66*  --   --  2.75*  --   --   --   --    < > = values in this interval not displayed.    Estimated Creatinine Clearance: 38.1 mL/min (A) (by C-G formula based on SCr of 2.75 mg/dL (H)).  Assessment: 5 yoM admitted with massive PE and RV failure. Pt received 150mg  total of systemic tPA with continued shock so ECMO started 12/8. EKOS started along with IV heparin, then repeated 12/10 with significant clot burden. RV function improving on ECHO 12/11 - holding off further tPA/thrombectomy.   Heparin level remains below goal this morning at 0.27 units/mL after small adjustment overnight. Hgb 8>8.6 postop after 2 units given. Pltc stable 90s. Oozing from cental line, improved this am after being addressed by surgery.   Patient de-cannulated this morning, no complications noted per nursing.  Verified with RN that heparin is still running at previous rate. Level low this morning prior to surgery, boluses received in OR. Will hold off on adjustments for now and will recheck level this evening, if still low and bleeding stable will consider adjustments.   Goal of Therapy:  Heparin level 0.3-0.5 units/mL for the first 24 hours post decannulation Monitor platelets by anticoagulation protocol: Yes    Plan:  Continue heparin 1500 units/hr Check heparin level this evening Monitor for bleeding  Erin Hearing PharmD., BCPS Clinical Pharmacist 02/07/2019 1:00 PM

## 2019-02-02 ENCOUNTER — Encounter: Payer: Self-pay | Admitting: *Deleted

## 2019-02-02 ENCOUNTER — Inpatient Hospital Stay: Payer: Self-pay

## 2019-02-02 ENCOUNTER — Inpatient Hospital Stay (HOSPITAL_COMMUNITY): Payer: BC Managed Care – PPO

## 2019-02-02 DIAGNOSIS — I468 Cardiac arrest due to other underlying condition: Secondary | ICD-10-CM

## 2019-02-02 DIAGNOSIS — Z66 Do not resuscitate: Secondary | ICD-10-CM

## 2019-02-02 DIAGNOSIS — J989 Respiratory disorder, unspecified: Secondary | ICD-10-CM

## 2019-02-02 DIAGNOSIS — Z9281 Personal history of extracorporeal membrane oxygenation (ECMO): Secondary | ICD-10-CM

## 2019-02-02 DIAGNOSIS — I2699 Other pulmonary embolism without acute cor pulmonale: Secondary | ICD-10-CM

## 2019-02-02 LAB — CBC
HCT: 25.5 % — ABNORMAL LOW (ref 39.0–52.0)
HCT: 26.1 % — ABNORMAL LOW (ref 39.0–52.0)
HCT: 26.3 % — ABNORMAL LOW (ref 39.0–52.0)
HCT: 26.5 % — ABNORMAL LOW (ref 39.0–52.0)
Hemoglobin: 8.2 g/dL — ABNORMAL LOW (ref 13.0–17.0)
Hemoglobin: 8.3 g/dL — ABNORMAL LOW (ref 13.0–17.0)
Hemoglobin: 8.4 g/dL — ABNORMAL LOW (ref 13.0–17.0)
Hemoglobin: 8.5 g/dL — ABNORMAL LOW (ref 13.0–17.0)
MCH: 30.5 pg (ref 26.0–34.0)
MCH: 30.5 pg (ref 26.0–34.0)
MCH: 30.7 pg (ref 26.0–34.0)
MCH: 30.9 pg (ref 26.0–34.0)
MCHC: 31.7 g/dL (ref 30.0–36.0)
MCHC: 31.8 g/dL (ref 30.0–36.0)
MCHC: 32.2 g/dL (ref 30.0–36.0)
MCHC: 32.3 g/dL (ref 30.0–36.0)
MCV: 94.8 fL (ref 80.0–100.0)
MCV: 95.6 fL (ref 80.0–100.0)
MCV: 96 fL (ref 80.0–100.0)
MCV: 96.7 fL (ref 80.0–100.0)
Platelets: 119 10*3/uL — ABNORMAL LOW (ref 150–400)
Platelets: 124 10*3/uL — ABNORMAL LOW (ref 150–400)
Platelets: 134 10*3/uL — ABNORMAL LOW (ref 150–400)
Platelets: 134 10*3/uL — ABNORMAL LOW (ref 150–400)
RBC: 2.69 MIL/uL — ABNORMAL LOW (ref 4.22–5.81)
RBC: 2.72 MIL/uL — ABNORMAL LOW (ref 4.22–5.81)
RBC: 2.74 MIL/uL — ABNORMAL LOW (ref 4.22–5.81)
RBC: 2.75 MIL/uL — ABNORMAL LOW (ref 4.22–5.81)
RDW: 17.3 % — ABNORMAL HIGH (ref 11.5–15.5)
RDW: 17.6 % — ABNORMAL HIGH (ref 11.5–15.5)
RDW: 17.8 % — ABNORMAL HIGH (ref 11.5–15.5)
RDW: 17.9 % — ABNORMAL HIGH (ref 11.5–15.5)
WBC: 24.9 10*3/uL — ABNORMAL HIGH (ref 4.0–10.5)
WBC: 25.5 10*3/uL — ABNORMAL HIGH (ref 4.0–10.5)
WBC: 25.8 10*3/uL — ABNORMAL HIGH (ref 4.0–10.5)
WBC: 26.2 10*3/uL — ABNORMAL HIGH (ref 4.0–10.5)
nRBC: 1.6 % — ABNORMAL HIGH (ref 0.0–0.2)
nRBC: 1.6 % — ABNORMAL HIGH (ref 0.0–0.2)
nRBC: 1.7 % — ABNORMAL HIGH (ref 0.0–0.2)
nRBC: 2.3 % — ABNORMAL HIGH (ref 0.0–0.2)

## 2019-02-02 LAB — COMPREHENSIVE METABOLIC PANEL
ALT: 43 U/L (ref 0–44)
ALT: 44 U/L (ref 0–44)
AST: 85 U/L — ABNORMAL HIGH (ref 15–41)
AST: 79 U/L — ABNORMAL HIGH (ref 15–41)
Albumin: 2.3 g/dL — ABNORMAL LOW (ref 3.5–5.0)
Albumin: 2.3 g/dL — ABNORMAL LOW (ref 3.5–5.0)
Alkaline Phosphatase: 101 U/L (ref 38–126)
Alkaline Phosphatase: 94 U/L (ref 38–126)
Anion gap: 12 (ref 5–15)
Anion gap: 12 (ref 5–15)
BUN: 67 mg/dL — ABNORMAL HIGH (ref 8–23)
BUN: 58 mg/dL — ABNORMAL HIGH (ref 8–23)
CO2: 23 mmol/L (ref 22–32)
CO2: 22 mmol/L (ref 22–32)
Calcium: 7.6 mg/dL — ABNORMAL LOW (ref 8.9–10.3)
Calcium: 7.9 mg/dL — ABNORMAL LOW (ref 8.9–10.3)
Chloride: 107 mmol/L (ref 98–111)
Chloride: 108 mmol/L (ref 98–111)
Creatinine, Ser: 2.09 mg/dL — ABNORMAL HIGH (ref 0.61–1.24)
Creatinine, Ser: 1.9 mg/dL — ABNORMAL HIGH (ref 0.61–1.24)
GFR calc Af Amer: 37 mL/min — ABNORMAL LOW (ref >60)
GFR calc Af Amer: 41 mL/min — ABNORMAL LOW (ref >60)
GFR calc non Af Amer: 32 mL/min — ABNORMAL LOW (ref >60)
GFR calc non Af Amer: 36 mL/min — ABNORMAL LOW (ref >60)
Glucose, Bld: 133 mg/dL — ABNORMAL HIGH (ref 70–99)
Glucose, Bld: 158 mg/dL — ABNORMAL HIGH (ref 70–99)
Potassium: 4.3 mmol/L (ref 3.5–5.1)
Potassium: 4.7 mmol/L (ref 3.5–5.1)
Sodium: 142 mmol/L (ref 135–145)
Sodium: 142 mmol/L (ref 135–145)
Total Bilirubin: 2.4 mg/dL — ABNORMAL HIGH (ref 0.3–1.2)
Total Bilirubin: 2.9 mg/dL — ABNORMAL HIGH (ref 0.3–1.2)
Total Protein: 4.9 g/dL — ABNORMAL LOW (ref 6.5–8.1)
Total Protein: 5.3 g/dL — ABNORMAL LOW (ref 6.5–8.1)

## 2019-02-02 LAB — COOXEMETRY PANEL
Carboxyhemoglobin: 1.8 % — ABNORMAL HIGH (ref 0.5–1.5)
Methemoglobin: 1.8 % — ABNORMAL HIGH (ref 0.0–1.5)
O2 Saturation: 69.8 %
Total hemoglobin: 8.8 g/dL — ABNORMAL LOW (ref 12.0–16.0)

## 2019-02-02 LAB — POCT I-STAT 7, (LYTES, BLD GAS, ICA,H+H)
Acid-Base Excess: 1 mmol/L (ref 0.0–2.0)
Acid-base deficit: 1 mmol/L (ref 0.0–2.0)
Acid-base deficit: 2 mmol/L (ref 0.0–2.0)
Acid-base deficit: 1 mmol/L (ref 0.0–2.0)
Bicarbonate: 25.6 mmol/L (ref 20.0–28.0)
Bicarbonate: 23.7 mmol/L (ref 20.0–28.0)
Bicarbonate: 22.6 mmol/L (ref 20.0–28.0)
Bicarbonate: 22.4 mmol/L (ref 20.0–28.0)
Calcium, Ion: 1.14 mmol/L — ABNORMAL LOW (ref 1.15–1.40)
Calcium, Ion: 1.08 mmol/L — ABNORMAL LOW (ref 1.15–1.40)
Calcium, Ion: 1.09 mmol/L — ABNORMAL LOW (ref 1.15–1.40)
Calcium, Ion: 1.07 mmol/L — ABNORMAL LOW (ref 1.15–1.40)
HCT: 22 % — ABNORMAL LOW (ref 39.0–52.0)
HCT: 24 % — ABNORMAL LOW (ref 39.0–52.0)
HCT: 24 % — ABNORMAL LOW (ref 39.0–52.0)
HCT: 23 % — ABNORMAL LOW (ref 39.0–52.0)
Hemoglobin: 7.5 g/dL — ABNORMAL LOW (ref 13.0–17.0)
Hemoglobin: 8.2 g/dL — ABNORMAL LOW (ref 13.0–17.0)
Hemoglobin: 8.2 g/dL — ABNORMAL LOW (ref 13.0–17.0)
Hemoglobin: 7.8 g/dL — ABNORMAL LOW (ref 13.0–17.0)
O2 Saturation: 97 %
O2 Saturation: 91 %
O2 Saturation: 91 %
O2 Saturation: 92 %
Patient temperature: 35.7
Patient temperature: 35.4
Patient temperature: 35.2
Patient temperature: 35.9
Potassium: 4.2 mmol/L (ref 3.5–5.1)
Potassium: 4.3 mmol/L (ref 3.5–5.1)
Potassium: 4.3 mmol/L (ref 3.5–5.1)
Potassium: 4.4 mmol/L (ref 3.5–5.1)
Sodium: 142 mmol/L (ref 135–145)
Sodium: 142 mmol/L (ref 135–145)
Sodium: 141 mmol/L (ref 135–145)
Sodium: 141 mmol/L (ref 135–145)
TCO2: 27 mmol/L (ref 22–32)
TCO2: 25 mmol/L (ref 22–32)
TCO2: 24 mmol/L (ref 22–32)
TCO2: 23 mmol/L (ref 22–32)
pCO2 arterial: 39.7 mmHg (ref 32.0–48.0)
pCO2 arterial: 34.1 mmHg (ref 32.0–48.0)
pCO2 arterial: 32.8 mmHg (ref 32.0–48.0)
pCO2 arterial: 28.5 mmHg — ABNORMAL LOW (ref 32.0–48.0)
pH, Arterial: 7.412 (ref 7.350–7.450)
pH, Arterial: 7.445 (ref 7.350–7.450)
pH, Arterial: 7.439 (ref 7.350–7.450)
pH, Arterial: 7.501 — ABNORMAL HIGH (ref 7.350–7.450)
pO2, Arterial: 82 mmHg — ABNORMAL LOW (ref 83.0–108.0)
pO2, Arterial: 53 mmHg — ABNORMAL LOW (ref 83.0–108.0)
pO2, Arterial: 52 mmHg — ABNORMAL LOW (ref 83.0–108.0)
pO2, Arterial: 54 mmHg — ABNORMAL LOW (ref 83.0–108.0)

## 2019-02-02 LAB — RENAL FUNCTION PANEL
Albumin: 2.3 g/dL — ABNORMAL LOW (ref 3.5–5.0)
Anion gap: 12 (ref 5–15)
BUN: 63 mg/dL — ABNORMAL HIGH (ref 8–23)
CO2: 23 mmol/L (ref 22–32)
Calcium: 7.6 mg/dL — ABNORMAL LOW (ref 8.9–10.3)
Chloride: 107 mmol/L (ref 98–111)
Creatinine, Ser: 2.15 mg/dL — ABNORMAL HIGH (ref 0.61–1.24)
GFR calc Af Amer: 36 mL/min — ABNORMAL LOW (ref >60)
GFR calc non Af Amer: 31 mL/min — ABNORMAL LOW (ref >60)
Glucose, Bld: 129 mg/dL — ABNORMAL HIGH (ref 70–99)
Phosphorus: 4.3 mg/dL (ref 2.5–4.6)
Potassium: 4.1 mmol/L (ref 3.5–5.1)
Sodium: 142 mmol/L (ref 135–145)

## 2019-02-02 LAB — HEPARIN LEVEL (UNFRACTIONATED)
Heparin Unfractionated: 0.26 IU/mL — ABNORMAL LOW (ref 0.30–0.70)
Heparin Unfractionated: 0.23 IU/mL — ABNORMAL LOW (ref 0.30–0.70)
Heparin Unfractionated: 0.36 IU/mL (ref 0.30–0.70)

## 2019-02-02 LAB — MAGNESIUM
Magnesium: 2.5 mg/dL — ABNORMAL HIGH (ref 1.7–2.4)
Magnesium: 2.5 mg/dL — ABNORMAL HIGH (ref 1.7–2.4)
Magnesium: 2.4 mg/dL (ref 1.7–2.4)

## 2019-02-02 LAB — GLUCOSE, CAPILLARY
Glucose-Capillary: 147 mg/dL — ABNORMAL HIGH (ref 70–99)
Glucose-Capillary: 138 mg/dL — ABNORMAL HIGH (ref 70–99)
Glucose-Capillary: 158 mg/dL — ABNORMAL HIGH (ref 70–99)
Glucose-Capillary: 163 mg/dL — ABNORMAL HIGH (ref 70–99)
Glucose-Capillary: 159 mg/dL — ABNORMAL HIGH (ref 70–99)

## 2019-02-02 LAB — PROTIME-INR
INR: 1.4 — ABNORMAL HIGH (ref 0.8–1.2)
Prothrombin Time: 17.4 seconds — ABNORMAL HIGH (ref 11.4–15.2)

## 2019-02-02 LAB — PHOSPHORUS
Phosphorus: 4.3 mg/dL (ref 2.5–4.6)
Phosphorus: 4.5 mg/dL (ref 2.5–4.6)

## 2019-02-02 LAB — LACTATE DEHYDROGENASE
LDH: 404 U/L — ABNORMAL HIGH (ref 98–192)
LDH: 364 U/L — ABNORMAL HIGH (ref 98–192)

## 2019-02-02 LAB — APTT: aPTT: 80 seconds — ABNORMAL HIGH (ref 24–36)

## 2019-02-02 LAB — LACTIC ACID, PLASMA: Lactic Acid, Venous: 1.4 mmol/L (ref 0.5–1.9)

## 2019-02-02 MED ORDER — SODIUM CHLORIDE 0.9% FLUSH: 10.0000 mL | INTRAVENOUS | Status: DC | PRN

## 2019-02-02 MED ORDER — B COMPLEX-C PO TABS
1.0000 | ORAL_TABLET | Freq: Every day | ORAL | Status: DC
  Administered 2019-02-02 – 2019-02-03 (×2): 1 via ORAL
  Filled 2019-02-02 (×2): qty 1

## 2019-02-02 MED ORDER — VASOPRESSIN 20 UNIT/ML IV SOLN
0.0100 [IU]/min | INTRAVENOUS | Status: DC
  Administered 2019-02-02: 0.01 [IU]/min via INTRAVENOUS
  Administered 2019-02-02 – 2019-02-03 (×2): 0.03 [IU]/min via INTRAVENOUS
  Filled 2019-02-02 (×2): qty 2

## 2019-02-02 MED ORDER — PANTOPRAZOLE SODIUM 40 MG PO PACK
40.0000 mg | PACK | Freq: Two times a day (BID) | ORAL | Status: DC
  Administered 2019-02-02 – 2019-02-03 (×2): 40 mg
  Filled 2019-02-02 (×2): qty 20

## 2019-02-02 MED ORDER — SODIUM CHLORIDE 0.9% FLUSH: 10.0000 mL | Freq: Two times a day (BID) | INTRAVENOUS | Status: DC

## 2019-02-02 MED ORDER — VITAL HIGH PROTEIN PO LIQD
1000.0000 mL | ORAL | Status: DC
  Administered 2019-02-02: 1000 mL

## 2019-02-02 MED FILL — Heparin Sodium (Porcine) Inj 1000 Unit/ML: INTRAMUSCULAR | Qty: 2500 | Status: CN

## 2019-02-02 MED FILL — Magnesium Sulfate Inj 50%: INTRAMUSCULAR | Qty: 10 | Status: AC

## 2019-02-02 MED FILL — Heparin Sodium (Porcine) Inj 1000 Unit/ML: INTRAMUSCULAR | Qty: 30 | Status: CN

## 2019-02-02 MED FILL — Heparin Sodium (Porcine) Inj 1000 Unit/ML: INTRAMUSCULAR | Qty: 30 | Status: AC

## 2019-02-02 MED FILL — Tranexamic Acid IV Soln 1000 MG/10ML (100 MG/ML): INTRAVENOUS | Qty: 3000 | Status: CN

## 2019-02-02 MED FILL — Potassium Chloride Inj 2 mEq/ML: INTRAVENOUS | Qty: 40 | Status: CN

## 2019-02-02 MED FILL — Cefuroxime Sodium For Inj 750 MG: INTRAMUSCULAR | Qty: 750 | Status: CN

## 2019-02-02 MED FILL — Heparin Sodium (Porcine) Inj 1000 Unit/ML: INTRAMUSCULAR | Qty: 2500 | Status: AC

## 2019-02-02 MED FILL — Cefuroxime Sodium For Inj 750 MG: INTRAMUSCULAR | Qty: 750 | Status: AC

## 2019-02-02 MED FILL — Magnesium Sulfate Inj 50%: INTRAMUSCULAR | Qty: 10 | Status: CN

## 2019-02-02 MED FILL — Tranexamic Acid IV Soln 1000 MG/10ML (100 MG/ML): INTRAVENOUS | Qty: 3000 | Status: AC

## 2019-02-02 MED FILL — Potassium Chloride Inj 2 mEq/ML: INTRAVENOUS | Qty: 40 | Status: AC

## 2019-02-02 NOTE — Significant Event (Signed)
PCCM INTERVAL PROGRESS NOTE  I was made aware by the palliative medicine team that the patient's family has found paperwork outlining the patient's wishes under the circumstances and daily have elected to transition to comfort care.  I was present here this given how well the patient has responded to therapy and has been been improving significantly in the last 2 days.  He was decannulated from ECMO yesterday.  Today he is weaning from pressors and is waking up as we wean sedation.  I have discussed his case with Dr. Marigene Ehlers of cardiology and Dr. Elsworth Soho PCCM who are in agreement with me that the patient has responded well to therapy and we expect him to progress quite a bit more in the next few days.  I called the patient's wife Jaecion Dempster and was also able to speak to the patient's daughter.  I expressed to them our pleasure with the patient's progress over the past few days and how he has a legitimate chance of recovery to a meaningful quality of life.  They are concerned that he will not progress as far as he would like to, at which point he would be stuck with a miserable quality of life.  I have asked him to give [place] a couple more days to see how quickly he can progress from a pulmonary and vasopressor standpoint.  It is not unreasonable to expect him to be near extubation in the next 48 hours.  They will consider this and call me back at some point later today.  For the time being they would like to put a hold on transition to comfort care.   Georgann Housekeeper, AGACNP-BC [place]  See Amion for personal pager PCCM on call pager [phone]  02/02/2019 2:23 PM

## 2019-02-02 NOTE — Progress Notes (Signed)
Nutrition Follow up  DOCUMENTATION CODES:   Morbid obesity  INTERVENTION:   Add b complex with vitamin C to account for losses with CRRT  Continue tube feeding:  -Vital 1.5 @ 45 ml/hr (1080 ml) via OGT -60 ml Prostat TID  Provides: 2220 kcals, 163 grams protein, 825 ml free water. Meets 100% of needs.   NUTRITION DIAGNOSIS:   Inadequate oral intake related to inability to eat as evidenced by NPO status.  Ongoing  GOAL:   Patient will meet greater than or equal to 90% of their needs   Addressed via TF  MONITOR:   Vent status, Labs, Weight trends, TF tolerance, Skin, I & O's  REASON FOR ASSESSMENT:   Consult, Ventilator Enteral/tube feeding initiation and management  ASSESSMENT:   68 y.o. male with PMH of Arthritis, Hypertension, Sleep apnea, s/p gastric bypass. Presented to ED with progressive SOB. O2 sats in 60s, in respiratory distress on BiPAP in ED, intubated by PCCM. On CT pulmonary emboli identified, and right heart strain noted. Pt has passed several large blood clots from nose and mouth, has significant clot burden per NP.   12/8- s/p IR EKOS cath TPA, cannulation [place] ECMO, nasal packing per ENT 12/10- s/p IR EKOS cath TPA, nasal packing replaced with foley 12/11- Cortrak attempt unsuccessful, OGT placed by CMM- TF started  12/14- s/p TEE showed EF 65% with RV function near normal 12/16- s/p decannulation, chest tube placement   Pt discussed during ICU rounds and with RN.   Started on CRRT yesterday- pulling 50-100 ml/hr. Pressor requirement decreasing. Nasal packing pulled this am. Discussed switching from OGT to Cortrak tomorrow with ENT, they would like hold off. Tolerating Vital 1.5 @ 20 ml/hr via OGT. Plan titration back to goal rate. RN reports pt with ongoing diarrhea due to aggressive bowel regimen (5 days without prior).    Admission weight: 137 kg  Current weight: 139.7 kg- trending down over the last week  Patient is currently intubated on  ventilator support MV: 11.1 L/min Temp (24hrs), Avg:96.9 F (36.1 C), Min:95.5 F (35.3 C), Max:99 F (37.2 C)    I/O: +9,456 ml since admit UOP: 2.123 ml x 24 hrs CRRT: 2,748 ml x 24 hrs Rectal tube: 150 ml x 24 hrs Chest tubes: 910 ml x 24 hrs   Drips: fentanyl, levophed, vasopressin Medications: ss novolog, lantus Labs: Mg 2.5 (H) corrected calcium 9 (wdl) Cr 2.15-slight increase this am CBG 105-259   Diet Order:   Diet Order    None      EDUCATION NEEDS:   Not appropriate for education at this time  Skin:  Skin Assessment: Skin Integrity Issues: Skin Integrity Issues:: Incisions, Stage II, Other (Comment) Stage II: buttocks Incisions: R chest, R groin, R axilla Other: non-pressure gluteal fold  Last BM:  12/16 via rectal tube  Height:   Ht Readings from Last 1 Encounters:  01/28/19 5\' 9"  (1.753 m)     Weight:   Wt Readings from Last 1 Encounters:  02/02/19 (!) 139.7 kg    Ideal Body Weight:  72.7 kg  BMI:  Body mass index is 45.48 kg/m.  Estimated Nutritional Needs:   Kcal:  2221 kcal  Protein:  155-185 grams  Fluid:  >/= 1.6 L  Mariana Single RD, LDN Clinical Nutrition Pager # [phone]

## 2019-02-02 NOTE — Progress Notes (Signed)
Peripherally Inserted Central Catheter/Midline Placement  The IV Nurse has discussed with the patient and/or persons authorized to consent for the patient, the purpose of this procedure and the potential benefits and risks involved with this procedure.  The benefits include less needle sticks, lab draws from the catheter, and the patient may be discharged home with the catheter. Risks include, but not limited to, infection, bleeding, blood clot (thrombus formation), and puncture of an artery; nerve damage and irregular heartbeat and possibility to perform a PICC exchange if needed/ordered by physician.  Alternatives to this procedure were also discussed.  Bard Power PICC patient education guide, fact sheet on infection prevention and patient information card has been provided to patient /or left at bedside.    PICC/Midline Placement Documentation  PICC Double Lumen 02/02/19 PICC Right Brachial 42 cm 0 cm (Active)  Indication for Insertion or Continuance of Line Prolonged intravenous therapies 02/02/19 1300  Exposed Catheter (cm) 0 cm 02/02/19 1300  Site Assessment Clean;Dry;Intact 02/02/19 1300  Lumen #1 Status Flushed;Blood return noted 02/02/19 1300  Lumen #2 Status Flushed;Blood return noted 02/02/19 1300  Dressing Type Transparent 02/02/19 1300  Dressing Status Clean;Dry;Intact;Antimicrobial disc in place 02/02/19 1300  Dressing Change Due 02/09/19 02/02/19 1300       Philip Wolfe 02/02/2019, 1:27 PM

## 2019-02-02 NOTE — Progress Notes (Signed)
ANTICOAGULATION CONSULT NOTE   Pharmacy Consult for Heparin  Indication: pulmonary embolus (S/P ECMO)  No Known Allergies  Patient Measurements: Height: 5\' 9"  (175.3 cm) Weight: (!) 307 lb 15.7 oz (139.7 kg) IBW/kg (Calculated) : 70.7 Heparin Dosing Weight: 103 kg  Vital Signs: Temp: 96.1 F (35.6 C) (12/17 0800) Temp Source: Bladder (12/17 0400) BP: 102/46 (12/17 0740) Pulse Rate: 73 (12/17 0800)  Labs: Recent Labs    01/31/19 2307 02/06/2019 0500 02/11/2019 0808 01/27/2019 0950 02/02/2019 1608 02/06/2019 1937 01/18/2019 2359 02/02/19 0207 02/02/19 0517 02/02/19 0521  HGB 8.5* 8.0*  --   --  8.9*  --  8.2*  --  8.3* 7.5*  HCT 26.0* 24.6*  --   --  27.4*  --  25.5*  --  26.1* 22.0*  PLT 96* 97*  --    < > 115*  --  119*  --  124*  --   APTT 62* 57*  --   --   --   --   --   --  80*  --   LABPROT 16.4* 16.7*  --   --   --   --   --   --  17.4*  --   INR 1.3* 1.4*  --   --   --   --   --   --  1.4*  --   HEPARINUNFRC 0.24*  --  0.27*  --   --  0.34  --  0.26*  --   --   CREATININE  --  2.75*  --   --  2.92*  --   --   --  2.09*  --    < > = values in this interval not displayed.    Estimated Creatinine Clearance: 47.7 mL/min (A) (by C-G formula based on SCr of 2.09 mg/dL (H)).  Assessment: 68 yr old male admitted with massive PE and RV failure. Pt received 150 mg total of systemic tPA with continued shock, so ECMO started 12/8. EKOS started along with IV heparin, then repeated 12/10 with significant clot burden. RV function improving on ECHO 12/11 - holding off further tPA/thrombectomy.  Pt de-cannulated 83/41 without complications.  Heparin level drawn this morning is still low at 0.23 units/ml. Per RN, no issues with IV or bleeding observed. H/H 8.2/24.0, platelets 134.  Goal of Therapy:  Heparin level 0.3-0.7 Monitor platelets by anticoagulation protocol: Yes    Plan:  Heparin to 1800 units/hr Re-check heparin level in 8 hours Monitor daily heparin level, CBC Monitor  for signs/symptoms of bleeding  Erin Hearing PharmD., BCPS Clinical Pharmacist 02/02/2019 8:48 AM

## 2019-02-02 NOTE — Progress Notes (Signed)
Spoke with Dr. Carolin Sicks with [facility]. Ok to place PICC line.

## 2019-02-02 NOTE — Progress Notes (Signed)
Palliative:  HPI: 68 yo male with PMH HTN, arthritis, sleep apnea, obesity admitted 02/04/2019 with hypoxia respiratory failure with massive bilateral PE leading to PEA arrest in ED. Intubated and found to have cardiogenic shock with RV failure. Began [place] ECMO 02/12/2019 and decannulated 02/16/2019. 12/17 CRRT started and continues on vent support; maintaining well off ECMO.    Discussed status with RN Misha at bedside. Down to 50% FIO2 and tolerating CRRT well. Attempting to wean sedation and do wake up assessment.   I met today via zoom call with wife, Lelon Frohlich, and children, Ebony Hail and Thurmond Butts, per request. We had a long discussion where I provided updates on current status and with input from other disciplines that he is progressing well. There has been some confusion with wording and terminology. I explained that he does continue to be critical needing much support but that this is to be expected coming off of ECMO as his body heals and adjusts. We discussed the goal of dialysis to assist with fluid removal and that he is in a level of kidney failure but not complete kidney failure. No indication at this time that he would need dialysis long term.   Family have many concern with his overall critical status and prognosis. They worry about putting him through a long trajectory of care and that at the end of this he will not have an acceptable QOL. They have spent time reviewing his Living Will and feel his wishes are clear that he would not desire to continue this level of care. They are concerned with his overall poor health over the past months/year and his ability to recover to the QOL he would desire.   We discussed at length the struggle with not knowing what this will look like him in 1 day, 1 week, or 1 month. I was clear that team is optimistic at this time and that he could potentially return to an acceptable QOL but this is also not guaranteed.   Family tell me that they have been speaking and considering  options and feel that he would not desire this level of care and that the right thing to do for [place] (and what he would want) is to focus on comfort and let him die peacefully. I explained that I will respect their decision whatever it may be and will discuss this with the entirety of his medical team. They wish to proceed with transition to comfort care today.   I relayed this information to Dr. Prescott Gum, Dr. Aundra Dubin, NP Georgann Housekeeper. They had some concerns about transition to comfort at this stage and I encouraged them to call and speak with family.   I did follow up with Lelon Frohlich and she has had a chance to speak with Eddie Dibbles and wish to speak with Dr. Prescott Gum and Dr. Aundra Dubin as well so they can weigh their input and recommendations. I agreed that this is a good idea and that at any stage we have the option to focus on comfort and not pursue life prolonging measures and I went them to be sure this is what they want for him. Family are still considering options and would like to touch base with palliative care again tomorrow. We agreed to plan for DNR and no escalation to further aggressive measures but to continue with current measures for now.   All questions/concerns addressed to the best of my ability. Emotional support provided.   Exam: Sedated on vent. FiO2 50%. Breathing regular, unlabored, no  vent dyssynchrony. Abd soft. JP charged and CT in place to suction drainage system. CRRT running.   Plan: - Family has concerns with his ability to improve to acceptable QOL largely d/t concern for poor health and decline over the past months/years. They question the benefit of continued aggressive care and the life that this could bring him back too. They also question if they have been abiding by his wishes outlined in Living Will and discussions they have previously had.  - We all agreed with DNR and no escalation to additional aggressive/invasive interventions.  - Palliative to f/u daily for continued  support to family as they gather information/recommendations from all services to further make decisions.   [id number] 370 min  Vinie Sill, NP Palliative Medicine Team Pager [phone] (Please see [email or web address] for schedule) Team Phone [phone]    Greater than 50%  of this time was spent counseling and coordinating care related to the above assessment and plan

## 2019-02-02 NOTE — Progress Notes (Signed)
NAME:  Philip Wolfe, MRN:  [id number], DOB:  [birth year], LOS: 80 ADMISSION DATE:  02/14/2019, CONSULTATION DATE:  01/17/2019 REFERRING MD:  Dr. Ralene Bathe EDP, CHIEF COMPLAINT:  Hypoxia   Brief History   68 year old male admitted 12/7 with massive PE s/p thrombolytics and catheter directed lysis.  Has cardiac arrest, significant RV failure requiring initiation of ECMO  Past Medical History   has a past medical history of Arthritis, Hypertension, Sleep apnea, Ulcer (left medial ankle), and Venous stasis ulcer ([place]).   Significant Hospital Events   12/7-Admit, intubated coded, given TPA 800 mg and then 50 mg 6 hours later 12/8- Cannulated for VA ECMO 01/27/2019 and underwent EKOS directed catheter placement.  Inhaled NO started 12/9- Significant epistaxis requiring 3 units PRBC and nasal packing by ENT, bronchoscopy with no evidence of pulmonary bleed 12/10- Second round of EKOS directed catheter TPA, nasal packing replaced by [facility] catheter by ENT 12/11-reviewed echo with cardiology.  RV function is improving.  Held off further lytics/thrombectomy 12/13-requiring daily blood transfusions.  No evidence of overt bleeding.  Pressors weaning down 12/14 TEE with flow wean 12/15 giapreza added.  12/17 going to OR for TEE and possible decannulation  Consults:  PCCM, cardiology, cardiothoracic surgery  Procedures:  R axillary arterial catheter, R femoral venous catheter, RIJ introducer, LIJ MML, L radial artery  Significant Diagnostic Tests:  CTA 12/7-massive bilateral central pulmonary emboli with significant decrease in pulmonary artery flow, patchy groundglass infiltrate, rib fractures on the right.  CT head 12/7-generalized atrophy, chronic ischemic microangiopathy.  Echo 12/9-severe reduction in RV systolic function with RV volume and pressure overload.  LVEF 50-55%  Micro Data:  Blood cultures 12/7-negative Blood culture 12/8-negative MRSA PCR 12/8-negative Respiratory culture  12/10-negative  Antimicrobials:  Cefepime 12/10 > Vanco 12/9 >> 12/13  Interim history/subjective:  Decannulated 12/16 Remains on norepi and Giaprezza Remains CRRT Family has requested meeting with PMT, scheduled for today at 1300  Objective   Blood pressure (!) 102/46, pulse 70, temperature (!) 96.4 F (35.8 C), resp. rate (!) 24, height 5\' 9"  (1.753 m), weight (!) 139.7 kg, SpO2 98 %. CVP:  [5 mmHg-15 mmHg] 5 mmHg  Vent Mode: PRVC FiO2 (%):  [50 %-100 %] 50 % Set Rate:  [15 bmp-18 bmp] 18 bmp Vt Set:  [560 mL] 560 mL PEEP:  [5 cmH20-12 cmH20] 12 cmH20 Plateau Pressure:  [25 cmH20-30 cmH20] 27 cmH20   Intake/Output Summary (Last 24 hours) at 02/02/2019 0806 Last data filed at 02/02/2019 0800 Gross per 24 hour  Intake 6149.87 ml  Output 7336 ml  Net -1186.13 ml   Filed Weights   01/31/19 0500 02/08/19 0500 02/02/19 0500  Weight: (!) 149.9 kg (!) 152.4 kg (!) 139.7 kg   Examination General 68 year old white male remains on full ventilatory support & VA ECMO. Massively volume overloaded.  HENT orally intubated. Has foley catheter in right nare to tamponade bleeding Pulm rhonchus equal chest rise. occ cough  Card/chest: RRR axillary ECMO lines abd hypoactive  Ext massive anasarca w/ weeping extremities  Neuro sedated  GU decreased UOP  Resolved Hospital Problem list     Assessment & Plan:  Acute cor pulmonale due to massive PE- S/p TPA x 2 and EKOS X 2, on heparin drip and VA ECMO.  Suspect this is PE on CTEPH given clinical history and response to therapy. Plan Heparin per pharmacy Will need life long AC if survives.   Circulatory shock. Likely more vasodilatory/distributive than cardiogenic  w/ SVR of 295; but does have RV failure 2/2 PE. VA ECMO discontinued 12/16. Plan Cont current vasoactive gtts (norepi and giapreza) Wean giaprezza first as tolerated for MAP > 65 Heart failure team following.  ECMO per HF team.  Inhaled nitric per CVTS  Acute hypoxic,  hypercarbic respiratory failure-  Plan Back to full vent support s/p ECMO decannulation PO2 80 today. Keep FIO2 and PEEP no change Check ABG in MA VAP bundle SBT at tolerated Versed/fentanyl for RASS goal -1 to -2.   AKI 2/2 cardiorenal syndrome and ATN w/ Diffuse anasarca- related to reactive hypo-osmolar state, immobility, and fluid administration, probable mild volume overloaded state of heart  Plan Nephrology following CRRT started 12/16  Ongoing transfusion-dependent anemia- LDH stable but low grade hemolysis inevitable, had significant nasopharyngeal bleeding but this now under control Plan Trend CBC Transfuse for hemoglobin < 7.   Leukocytosis- stable, reactive, no obvious infection. Cultures negative Plan  Monitor clinically  Thrombocytopenia- reactive and some consumption due to ECMO inevitable, looks okay today Plan Cont to trend  Some improvement now that ECMO stopped  Epistaxis- packed by ENT with good response, appreciate help Plan Packing per ENT Cefepime day 8: to continue as long as nasal packing in place.  Packing removed 12/17, will stop cefepime tomorrow 12/18.   Hyperglyemia Plan SSI  Best practice Nutrition: tubefeeds PUD: PPI VTE: IV heparin Sedation protocol Family: PMT meeting today at 1300 VAP protocol   Critical care time 38 mins   Joneen Roach, AGACNP-BC [facility]  See Amion for personal pager PCCM on call pager [phone]  02/02/2019 8:18 AM

## 2019-02-02 NOTE — Progress Notes (Signed)
[facility] NEPHROLOGY PROGRESS NOTE  Assessment/ Plan: Pt is a 68 y.o. yo male  who was admitted on 12/7 with massive pulmonary embolism status post thrombolytics and catheter directed lysis, had a cardiac arrest with significant right ventricular failure requiring ECMO since 12/8-12/16, seen as a consultation for AKI and anasarca.  #Acute kidney injury, nonoliguric: Multifactorial etiology including contrast nephropathy, cardiogenic shock/arrest. -CT scan showed no hydronephrosis however has exophytic lesion on right kidneys. -Started CRRT on 12/16 for fluid overload refractory to IV diuretics. -Left subclavian was placed on 12/16. -CRRT with 4K bath.  Unable to UF overnight because of hypotension.  Try to ultrafiltrate if tolerated by blood pressure, discussed with ICU nurse. On IV heparin for systemic anticoagulation. -Monitor electrolytes, potassium, calcium.  #Anasarca: Failed IV diuretics.  Try to attempt UF as tolerated.   #Cardiogenic shock: On multiple pressors.  Maintain acceptable MAP.  #Massive pulmonary embolism with cor pulmonale: Status post TPA and thrombectomy.  On IV heparin.  As per pulmonary team.  Subjective: Seen and examined at bedside.  Overnight patient became hypotensive therefore did not get much UF.  Remains sedated and requiring maximum dose of levophed and angiotensin. Objective Vital signs in last 24 hours: Vitals:   02/02/19 0400 02/02/19 0405 02/02/19 0500 02/02/19 0600  BP: (!) 111/48     Pulse: 73 73 73 74  Resp: (!) 26 (!) 23 (!) 25 (!) 25  Temp: (!) 96.6 F (35.9 C) (!) 96.6 F (35.9 C) (!) 96.3 F (35.7 C) (!) 96.4 F (35.8 C)  TempSrc: Bladder     SpO2: 96% 96% 96% 97%  Weight:   (!) 139.7 kg   Height:       Weight change: -12.7 kg  Intake/Output Summary (Last 24 hours) at 02/02/2019 0743 Last data filed at 02/02/2019 0700 Gross per 24 hour  Intake 6182.62 ml  Output 7521 ml  Net -1338.38 ml       Labs: Basic  Metabolic Panel: Recent Labs  Lab 01/29/19 1549 01/29/19 1556 02/02/2019 0455 02/02/2019 0510 02/05/2019 0500 01/31/2019 1608 02/10/2019 1707 02/02/19 0517 02/02/19 0521  NA 144  --  143  --  144 149* 147* 142 142  K 4.1  --  4.2  --  4.1 4.4 4.3 4.3 4.2  CL 107   < > 107   < > 105 112*  --  107  --   CO2 26   < > 26   < > 25 24  --  23  --   GLUCOSE 170*   < > 185*   < > 259* 142*  --  133*  --   BUN 52*   < > 60*   < > 89* 96*  --  67*  --   CREATININE 1.76*   < > 1.84*   < > 2.75* 2.92*  --  2.09*  --   CALCIUM 7.0*   < > 7.0*   < > 7.7* 7.8*  --  7.6*  --   PHOS 2.8  --  4.5  --   --  6.6*  --   --   --    < > = values in this interval not displayed.   Liver Function Tests: Recent Labs  Lab 01/22/2019 0500 02/10/2019 1608 02/02/19 0517  AST 102* 93* 85*  ALT 46* 42 43  ALKPHOS 102 96 101  BILITOT 1.5* 2.3* 2.4*  PROT 4.5* 4.7* 4.9*  ALBUMIN 2.0* 2.2* 2.3*   No  results for input(s): LIPASE, AMYLASE in the last 168 hours. No results for input(s): AMMONIA in the last 168 hours. CBC: Recent Labs  Lab 01/19/2019 0455 01/21/2019 0510 01/18/2019 0500 02/04/2019 1214 01/22/2019 1608 02/11/2019 2359 02/02/19 0517 02/02/19 0521  WBC 24.3*   < > 18.6* 20.2* 21.5* 26.2* 24.9*  --   NEUTROABS 16.7*  --   --   --   --   --   --   --   HGB 8.3*  --  8.0* 8.6* 8.9* 8.2* 8.3* 7.5*  HCT 24.4*  --  24.6* 27.2* 27.4* 25.5* 26.1* 22.0*  MCV 92.1   < > 95.7 96.5 95.8 94.8 96.0  --   PLT 72*   < > 97* 96* 115* 119* 124*  --    < > = values in this interval not displayed.   Cardiac Enzymes: No results for input(s): CKTOTAL, CKMB, CKMBINDEX, TROPONINI in the last 168 hours. CBG: Recent Labs  Lab 01/22/2019 1221 02/07/2019 1615 02/07/2019 2015 01/20/2019 2358 02/02/19 0520  GLUCAP 158* 143* 105* 140* 147*    Iron Studies: No results for input(s): IRON, TIBC, TRANSFERRIN, FERRITIN in the last 72 hours. Studies/Results: DG Chest 1 View  Result Date: 02/14/2019 CLINICAL DATA:  Chest tube, intubated,  on ECMO. EXAM: CHEST  1 VIEW COMPARISON:  01/31/2019 and CT chest 01/19/2019. FINDINGS: Endotracheal tube terminates 3.6 cm above the carina. Nasogastric tube terminates in the stomach with the side port at/or just beyond the gastroesophageal junction. Left IJ central line tip is at the junction with the left subclavian vein. ECMO catheter terminates in the SVC. Lungs are low in volume with left lower lobe collapse/consolidation and bibasilar airspace haziness. Probable small bilateral pleural effusions. IMPRESSION: 1. Low lung volumes with bibasilar airspace opacification, left greater than right, which may be due to atelectasis and/or pneumonia. Findings are similar to 01/31/2019. 2. Small bilateral pleural effusions. Electronically Signed   By: Lorin Picket M.D.   On: 02/13/2019 09:31   DG Chest Port 1 View  Result Date: 01/20/2019 CLINICAL DATA:  Status post ECMO cannula removal EXAM: PORTABLE CHEST 1 VIEW COMPARISON:  01/17/2019 FINDINGS: Cardiac shadow remains enlarged. Endotracheal tube and bilateral thoracostomy catheters are noted in satisfactory position. No pneumothorax is seen. Left jugular central line is noted and stable. Previously seen ECMO cannula has been withdrawn. No new focal abnormality is noted. Left subclavian temporary dialysis catheter is seen extending into the superior vena cava. IMPRESSION: No pneumothorax with bilateral chest tubes. Tubes and lines as described above. Electronically Signed   By: Inez Catalina M.D.   On: 01/21/2019 12:47    Medications: Infusions: .  prismasol BGK 4/2.5 500 mL/hr at 02/14/2019 1519  .  prismasol BGK 4/2.5 400 mL/hr at 02/15/2019 1519  . sodium chloride 10 mL/hr at 02/04/2019 0800  . angiotensin II (GIAPREZA) infusion 40 ng/kg/min (02/02/19 0700)  . ceFEPime (MAXIPIME) IV Stopped (02/12/2019 2219)  . feeding supplement (VITAL 1.5 CAL) Stopped (01/22/2019 0759)  . fentaNYL infusion INTRAVENOUS 350 mcg/hr (02/02/19 0700)  . furosemide (LASIX)  infusion Stopped (01/26/2019 1429)  . heparin 1,600 Units/hr (02/02/19 0700)  . midazolam 4 mg/hr (02/02/19 0700)  . norepinephrine (LEVOPHED) Adult infusion 50 mcg/min (02/02/19 0700)  . prismasol BGK 4/2.5 2,000 mL/hr at 01/29/2019 1610    Scheduled Medications: . sodium chloride   Intravenous Once  . sodium chloride   Intravenous Once  . bisacodyl  10 mg Oral Daily   Or  . bisacodyl  10  mg Rectal Daily  . chlorhexidine gluconate (MEDLINE KIT)  15 mL Mouth Rinse BID  . Chlorhexidine Gluconate Cloth  6 each Topical Daily  . docusate sodium  200 mg Oral Daily  . feeding supplement (PRO-STAT SUGAR FREE 64)  30 mL Per Tube TID  . insulin aspart  0-20 Units Subcutaneous Q4H  . insulin glargine  14 Units Subcutaneous Q24H  . mouth rinse  15 mL Mouth Rinse 10 times per day  . pantoprazole (PROTONIX) IV  40 mg Intravenous Q12H  . sildenafil  20 mg Per Tube TID  . sodium chloride flush  3 mL Intravenous Q12H    have reviewed scheduled and prn medications.  Physical Exam: General: Critically ill male, intubated, sedated, anasarca Heart:RRR, s1s2 nl Lungs: Coarse breath sound bilateral Abdomen:soft, Non-tender, non-distended Extremities: Gross anasarca.  No leg cyanosis or clubbing Dialysis Access: Left subclavian catheter placed on 12/16.  Tamla Winkels Prasad Clovis Warwick 02/02/2019,7:43 AM  LOS: 10 days  Pager: [phone]

## 2019-02-02 NOTE — Progress Notes (Signed)
Patient is still breathing over 30 breaths over the ventilator. Versed infusion was increased to 4mg /hr. Patient's blood pressure is very labile (95/44). CVP was 10. Patient is maxed out on pressors. CRRT was kept even for the past hour. [facility] notified. Orders were to give a one time dose of albumin and keep CRRT even for the night. Due to soft BP, further sedation will not be given. Was reinforced by MD to call if RR becomes >40. Will continue to monitor for any changes.

## 2019-02-02 NOTE — Progress Notes (Signed)
Rn called to report noted skin tear/skin breakdown noted under the ETT holder on upper lip, per RN she is placing wound dressing over the noted area.

## 2019-02-02 NOTE — Progress Notes (Signed)
Patient ID: LOWEN BARRINGER, male   DOB: [birth year], 68 y.o.   MRN: [id number] ]    Advanced Heart Failure Rounding Note  PCP-Cardiologist: No primary care provider on file.   Subjective:    - Massive PE with cardiogenic shock: VA ECMO initiated 12/8. Patient then had PA catheter-directed tPA until 8 am 12/9.  Left leg DVT.  - 12/9 ENT Merocel packing for severe epistaxis.  - PA catheter-directed tPA 12/10 again.   - TEE-guided ECMO wean on 12/14, initially did well with flow as low as 1 L/min, left at 2.5 L/min.  However, overnight drop in MAP with increase NE so increased flow back to 4.5.  - ECMO decannulated 12/16.  RV appeared normal in size with normal systolic function on TEE. CVVH started 12/16.   Nasopharyngeal packing removed this morning.   Now on norepinephrine 50 + Giapreza 40.  MAP stable.  CVP 8.  CVVH running net negative UF up to 100 cc/hr.  Heparin gtt ongoing with therapeutic level. iNO 20 currently and no sildenafil as OGT was lost.  Co-ox 70% this morning.   Empiric cefepime ongoing, afebrile, cultures remain negative.  WBCs 25 today.   Sedated on Versed/Fentanyl. Will open eyes and respond to pain with sedation wean.    Objective:   Weight Range: (!) 139.7 kg Body mass index is 45.48 kg/m.   Vital Signs:   Temp:  [96.3 F (35.7 C)-99 F (37.2 C)] 96.4 F (35.8 C) (12/17 0600) Pulse Rate:  [70-95] 70 (12/17 0740) Resp:  [21-31] 24 (12/17 0740) BP: (95-113)/(44-52) 102/46 (12/17 0740) SpO2:  [91 %-99 %] 98 % (12/17 0740) Arterial Line BP: (91-135)/(43-60) 108/47 (12/17 0600) FiO2 (%):  [50 %-100 %] 50 % (12/17 0740) Weight:  [139.7 kg] 139.7 kg (12/17 0500) Last BM Date: 01/25/2019  Weight change: Filed Weights   01/31/19 0500 01/20/2019 0500 02/02/19 0500  Weight: (!) 149.9 kg (!) 152.4 kg (!) 139.7 kg    Intake/Output:   Intake/Output Summary (Last 24 hours) at 02/02/2019 0806 Last data filed at 02/02/2019 0800 Gross per 24 hour  Intake 6149.87 ml    Output 7336 ml  Net -1186.13 ml      Physical Exam    General: Intubated/sedated.  Neck: Thick, JVP difficult, no thyromegaly or thyroid nodule.  Lungs: Decreased at bases.  CV: Nondisplaced PMI.  Heart regular S1/S2, no S3/S4, no murmur.  Anasarca.   Abdomen: Soft, nontender, no hepatosplenomegaly, no distention.  Skin: Chronic venous stasis skin changes.   Neurologic: Sedated Extremities: No clubbing or cyanosis.  HEENT: Normal.    Telemetry   NSR 80s-90s (personally reviewed)  Labs    CBC Recent Labs    02/04/2019 2359 02/02/19 0517 02/02/19 0521  WBC 26.2* 24.9*  --   HGB 8.2* 8.3* 7.5*  HCT 25.5* 26.1* 22.0*  MCV 94.8 96.0  --   PLT 119* 124*  --    Basic Metabolic Panel Recent Labs    02/02/2019 0500 02/09/2019 1608 02/02/19 0517 02/02/19 0521  NA 144 149* 142 142  K 4.1 4.4 4.3 4.2  CL 105 112* 107  --   CO2 '25 24 23  ' --   GLUCOSE 259* 142* 133*  --   BUN 89* 96* 67*  --   CREATININE 2.75* 2.92* 2.09*  --   CALCIUM 7.7* 7.8* 7.6*  --   MG 2.3  --  2.5*  --   PHOS  --  6.6*  --   --  Liver Function Tests Recent Labs    01/21/2019 1608 02/02/19 0517  AST 93* 85*  ALT 42 43  ALKPHOS 96 101  BILITOT 2.3* 2.4*  PROT 4.7* 4.9*  ALBUMIN 2.2* 2.3*   No results for input(s): LIPASE, AMYLASE in the last 72 hours. Cardiac Enzymes No results for input(s): CKTOTAL, CKMB, CKMBINDEX, TROPONINI in the last 72 hours.  BNP: BNP (last 3 results) Recent Labs    02/13/2019 1505  BNP 57.6    ProBNP (last 3 results) No results for input(s): PROBNP in the last 8760 hours.   D-Dimer No results for input(s): DDIMER in the last 72 hours. Hemoglobin A1C No results for input(s): HGBA1C in the last 72 hours. Fasting Lipid Panel No results for input(s): CHOL, HDL, LDLCALC, TRIG, CHOLHDL, LDLDIRECT in the last 72 hours. Thyroid Function Tests No results for input(s): TSH, T4TOTAL, T3FREE, THYROIDAB in the last 72 hours.  Invalid input(s): FREET3  Other  results:   Imaging    DG Chest 1 View  Result Date: 02/02/2019 CLINICAL DATA:  Chest tube present EXAM: CHEST  1 VIEW COMPARISON:  Radiograph yesterday.  Chest CT from 3 days ago FINDINGS: Endotracheal tube tip is between the clavicular heads and carina. 2 left-sided central lines with tip at the left brachiocephalic and brachiocephalic SVC confluence. Chest tubes in place. Lung volumes are low and there is hazy opacity likely reflecting atelectasis. No definite pneumothorax. Stable heart size. IMPRESSION: 1. New left subclavian line with tip at the brachiocephalic SVC confluence. 2. Pre-existing hardware in stable position. 3. Unchanged low volume chest with atelectasis/pleural fluid at the bases. Electronically Signed   By: Monte Fantasia M.D.   On: 02/02/2019 07:43   DG Chest Port 1 View  Result Date: 01/29/2019 CLINICAL DATA:  Status post ECMO cannula removal EXAM: PORTABLE CHEST 1 VIEW COMPARISON:  01/27/2019 FINDINGS: Cardiac shadow remains enlarged. Endotracheal tube and bilateral thoracostomy catheters are noted in satisfactory position. No pneumothorax is seen. Left jugular central line is noted and stable. Previously seen ECMO cannula has been withdrawn. No new focal abnormality is noted. Left subclavian temporary dialysis catheter is seen extending into the superior vena cava. IMPRESSION: No pneumothorax with bilateral chest tubes. Tubes and lines as described above. Electronically Signed   By: Inez Catalina M.D.   On: 02/15/2019 12:47     Medications:     Scheduled Medications: . sodium chloride   Intravenous Once  . sodium chloride   Intravenous Once  . bisacodyl  10 mg Oral Daily   Or  . bisacodyl  10 mg Rectal Daily  . chlorhexidine gluconate (MEDLINE KIT)  15 mL Mouth Rinse BID  . Chlorhexidine Gluconate Cloth  6 each Topical Daily  . docusate sodium  200 mg Oral Daily  . feeding supplement (PRO-STAT SUGAR FREE 64)  30 mL Per Tube TID  . insulin aspart  0-20 Units  Subcutaneous Q4H  . insulin glargine  14 Units Subcutaneous Q24H  . mouth rinse  15 mL Mouth Rinse 10 times per day  . pantoprazole (PROTONIX) IV  40 mg Intravenous Q12H  . sildenafil  20 mg Per Tube TID  . sodium chloride flush  3 mL Intravenous Q12H    Infusions: .  prismasol BGK 4/2.5 500 mL/hr at 02/11/2019 1519  .  prismasol BGK 4/2.5 400 mL/hr at 01/17/2019 1519  . sodium chloride 10 mL/hr at 01/29/2019 0800  . ceFEPime (MAXIPIME) IV Stopped (01/22/2019 2219)  . feeding supplement (VITAL 1.5 CAL) Stopped (  01/31/2019 0759)  . fentaNYL infusion INTRAVENOUS 350 mcg/hr (02/02/19 0800)  . furosemide (LASIX) infusion Stopped (02/14/2019 1429)  . heparin 1,600 Units/hr (02/02/19 0800)  . midazolam 2 mg/hr (02/02/19 0800)  . norepinephrine (LEVOPHED) Adult infusion 50 mcg/min (02/02/19 0800)  . prismasol BGK 4/2.5 2,000 mL/hr at 02/02/2019 1852  . vasopressin (PITRESSIN) infusion - *FOR SHOCK*      PRN Medications: sodium chloride, calcium chloride, dextrose, fentaNYL, fentaNYL, heparin, metoprolol tartrate, midazolam, midazolam, morphine injection, ondansetron (ZOFRAN) IV, oxyCODONE, sodium chloride flush, traMADol   Assessment/Plan   1.  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  VA ECMO initiated 12/8 and decannulated 12/16.  TEE in OR showed normal RV size and systolic function on 50/27, LVEF 60-65%. Still with significant pressor requirement, he remains on norepinephrine 50 and Giapreza 40.  He is now on CVVH, pulling up to 100 cc/hr net negative.  Still making some urine.  CVP today 8.  - Wean pressors as tolerated, will make transition from [place] to vasopressin today.   - Continue iNO for now, eventually back to sildenafil when he has OGT.      - He has diffuse anasarca but CVP is not particularly high. Would like to keep him gently net negative but would not be too aggressive to allow renal function recovery.  Let's aim for net 50 cc/hr UF today.     2. AKI:  Suspect due to shock as well as contrast nephropathy with several CTAs. - CVVH ongoing as above.  3. Acute hypoxic/hypoxemic respiratory failure: FiO2 0.5, per CCM.  4. ID: WBCs elevated but afebrile.  No PNA on CXR.  PCT not elevated initially.  Blood and trach aspirate cultures so far negative.  - Empiric coverage with cefepime .  5. Pulmonary embolus: Massive bilateral PE with left leg DVT.  He got 150 mg IV total tPA initially, then catheter directed tPA to both PAs on 12/9 and again on 12/10. CTA 12/14 showed some improvement but still with high clot burden in right PA.  - IR-guided thrombectomy is possible eventual strategy if needed (Dr. Jacqualyn Posey with IR aware of patient).   - Continue heparin gtt, level therapeutic.  6. ENT: ENT bleeding, has had nasal packing by ENT, resolved. Packing removed today.  7. Acute blood loss anemia: ENT bleeding initially but this has slowed.  Had some bleeding around arterial cannula with ECMO.  Transfuse hgb < 8. He had 1 more unit PRBCs yesterday in OR.  - Continue IV Protonix.  8. Thrombocytopenia: Plts 124 K this morning, improving.  Suspect due to critical illness.  9. FEN: Replace OGT today.  Will need to continue TFs.   10. Neuro: Lighten sedation to see if we can wake up this morning.   CRITICAL CARE Performed by: Loralie Champagne  Total critical care time: 40 minutes  Critical care time was exclusive of separately billable procedures and treating other patients.  Critical care was necessary to treat or prevent imminent or life-threatening deterioration.  Critical care was time spent personally by me on the following activities: development of treatment plan with patient and/or surrogate as well as nursing, discussions with consultants, evaluation of patient's response to treatment, examination of patient, obtaining history from patient or surrogate, ordering and performing treatments and interventions, ordering and review of laboratory  studies, ordering and review of radiographic studies, pulse oximetry and re-evaluation of patient's condition.   Length of Stay: [phone],  MD  02/02/2019, 8:06 AM  Advanced Heart Failure Team Pager [phone] (M-F; 7a - 4p)  Please contact [facility] for night-coverage after hours (4p -7a ) and weekends on [email or web address]

## 2019-02-02 NOTE — Progress Notes (Signed)
ANTICOAGULATION CONSULT NOTE   Pharmacy Consult for Heparin  Indication: pulmonary embolus (S/P ECMO)  No Known Allergies  Patient Measurements: Height: 5\' 9"  (175.3 cm) Weight: (!) 307 lb 15.7 oz (139.7 kg) IBW/kg (Calculated) : 70.7 Heparin Dosing Weight: 103 kg  Vital Signs: Temp: 96.8 F (36 C) (12/17 2000) Temp Source: Bladder (12/17 2000) BP: 120/57 (12/17 2000) Pulse Rate: 86 (12/17 2000)  Labs: Recent Labs    01/31/19 2307 02-06-2019 0500 02/06/19 0510 02/02/19 0207 02/02/19 0517 02/02/19 0850 02/02/19 1041 02/02/19 1206 02/02/19 1554 02/02/19 1900 02/02/19 1948  HGB 8.5* 8.0*  --   --  8.3*  --  8.4* 8.2* 8.5* 7.8*  --   HCT 26.0* 24.6*  --   --  26.1*  --  26.5* 24.0* 26.3* 23.0*  --   PLT 96* 97*   < >  --  124*  --  134*  --  134*  --   --   APTT 62* 57*  --   --  80*  --   --   --   --   --   --   LABPROT 16.4* 16.7*  --   --  17.4*  --   --   --   --   --   --   INR 1.3* 1.4*  --   --  1.4*  --   --   --   --   --   --   HEPARINUNFRC 0.24*  --    < > 0.26*  --   --  0.23*  --   --   --  0.36  CREATININE  --  2.75*   < >  --  2.09* 2.15*  --   --  1.90*  --   --    < > = values in this interval not displayed.    Estimated Creatinine Clearance: 52.5 mL/min (A) (by C-G formula based on SCr of 1.9 mg/dL (H)).  Assessment: 68 yr old male admitted with massive PE and RV failure. Pt received 150 mg total of systemic tPA with continued shock, so ECMO started 12/8. EKOS started along with IV heparin, then repeated 12/10 with significant clot burden. RV function improving on ECHO 12/11 - holding off further tPA/thrombectomy. Patient was de-cannulated from ECMO on 66/59 without complications.  Heparin level this PM is therapeutic after increase to 1800 units/hr.  No bleeding or issues with infusion noted.   Goal of Therapy:  Heparin level 0.3-0.7 Monitor platelets by anticoagulation protocol: Yes    Plan:  Continue Heparin at 1800 units/hr Monitor daily  heparin level, CBC Monitor for signs/symptoms of bleeding  Sloan Leiter, PharmD, BCPS, BCCCP Clinical Pharmacist Please refer to [facility] for [place] numbers 02/02/2019 8:30 PM

## 2019-02-02 NOTE — Progress Notes (Signed)
RN called d/t pt desat.  RR noted 38 bpm, pt appears to be uncomfortable (grimace) and belly breathing, sat 86-82%.  fio2 increased to 100%, sat now 92%.  Suctioned for moderate amount of secretions.  RN giving sedation.

## 2019-02-02 NOTE — Progress Notes (Signed)
NO tank changed out, previous tank reading 250 psi.  New tank in use, 1900 psi.

## 2019-02-02 NOTE — Progress Notes (Signed)
Subjective: Sedated. On vent. Off ECMO. Nasal packing removed.  Objective: Vital signs in last 24 hours: Temp:  [95.9 F (35.5 C)-99 F (37.2 C)] 95.9 F (35.5 C) (12/17 1000) Pulse Rate:  [69-95] 72 (12/17 1000) Resp:  [21-31] 28 (12/17 1000) BP: (95-113)/(44-52) 102/46 (12/17 0740) SpO2:  [90 %-99 %] 90 % (12/17 1022) Arterial Line BP: (91-140)/(43-61) 118/61 (12/17 1000) FiO2 (%):  [50 %-100 %] 60 % (12/17 1022) Weight:  [139.7 kg] 139.7 kg (12/17 0500)  Physical exam: General appearance:Morbidly obese andintubated. Sedated. Not responsive. Head:Normocephalic, without obvious abnormality, atraumatic Ears: Examination of the ears shows normal auricles and external auditory canals bilaterally.  Nose: Old blood clots in nasal cavities. No active bleeding. Face: Facial examination shows no asymmetry.  Mouth:ET tube in place.No more pharyngeal bleeding. Neck:Palpation of the neck reveals no lymphadenopathy or mass. The trachea is midline. The thyroid is not significantly enlarged.  Neuro:Sedated. Not responsive.  Recent Labs    02/12/2019 2359 02/02/19 0517 02/02/19 0521  WBC 26.2* 24.9*  --   HGB 8.2* 8.3* 7.5*  HCT 25.5* 26.1* 22.0*  PLT 119* 124*  --    Recent Labs    02/02/19 0517 02/02/19 0521 02/02/19 0850  NA 142 142 142  K 4.3 4.2 4.1  CL 107  --  107  CO2 23  --  23  GLUCOSE 133*  --  129*  BUN 67*  --  63*  CREATININE 2.09*  --  2.15*  CALCIUM 7.6*  --  7.6*    Medications:  I have reviewed the patient's current medications. Scheduled: . sodium chloride   Intravenous Once  . sodium chloride   Intravenous Once  . bisacodyl  10 mg Oral Daily   Or  . bisacodyl  10 mg Rectal Daily  . chlorhexidine gluconate (MEDLINE KIT)  15 mL Mouth Rinse BID  . Chlorhexidine Gluconate Cloth  6 each Topical Daily  . docusate sodium  200 mg Oral Daily  . feeding supplement (PRO-STAT SUGAR FREE 64)  30 mL Per Tube TID  . insulin aspart  0-20 Units Subcutaneous  Q4H  . insulin glargine  14 Units Subcutaneous Q24H  . mouth rinse  15 mL Mouth Rinse 10 times per day  . pantoprazole (PROTONIX) IV  40 mg Intravenous Q12H  . sildenafil  20 mg Per Tube TID  . sodium chloride flush  3 mL Intravenous Q12H   Continuous: .  prismasol BGK 4/2.5 500 mL/hr at 02/14/2019 1519  .  prismasol BGK 4/2.5 400 mL/hr at 01/17/2019 1519  . sodium chloride 10 mL/hr at 02/14/2019 0800  . ceFEPime (MAXIPIME) IV 200 mL/hr at 02/02/19 1000  . feeding supplement (VITAL 1.5 CAL) 1,000 mL (02/02/19 0955)  . fentaNYL infusion INTRAVENOUS 300 mcg/hr (02/02/19 1000)  . furosemide (LASIX) infusion Stopped (02/13/2019 1429)  . heparin 1,600 Units/hr (02/02/19 1000)  . midazolam Stopped (02/02/19 0835)  . norepinephrine (LEVOPHED) Adult infusion 38 mcg/min (02/02/19 1000)  . prismasol BGK 4/2.5 2,000 mL/hr at 02/02/19 1047  . vasopressin (PITRESSIN) infusion - *FOR SHOCK* 0.04 Units/min (02/02/19 1000)    Assessment/Plan: Recent severe bilateralepistaxiss/p TPA treatment for massive pulmonary emboli. - Bilateral nasal packing was removed by nursing staff earlier today. -No recurrent bleeding. -Will sign off.    LOS: 10 days   Philip Wolfe W Courtlyn Aki 02/02/2019, 10:42 AM

## 2019-02-02 NOTE — Progress Notes (Signed)
ANTICOAGULATION CONSULT NOTE   Pharmacy Consult for Heparin  Indication: pulmonary embolus (S/P ECMO)  No Known Allergies  Patient Measurements: Height: 5\' 9"  (175.3 cm) Weight: (!) 335 lb 15.7 oz (152.4 kg) IBW/kg (Calculated) : 70.7 Heparin Dosing Weight: 103 kg  Vital Signs: Temp: 96.8 F (36 C) (12/17 0300) Temp Source: Bladder (12/17 0000) BP: 113/51 (12/17 0000) Pulse Rate: 78 (12/17 0300)  Labs: Recent Labs    01/31/19 0331 01/31/19 0332 01/31/19 1634 01/31/19 1635 01/31/19 2307 2019-02-15 0500 2019-02-15 0808 02-15-2019 1214 Feb 15, 2019 1608 2019-02-15 1707 Feb 15, 2019 1937 15-Feb-2019 2359 02/02/19 0207  HGB 7.9*   < > 8.4*   < > 8.5* 8.0*  --  8.6* 8.9* 8.5*  --  8.2*  --   HCT 23.6*   < > 26.0*   < > 26.0* 24.6*  --  27.2* 27.4* 25.0*  --  25.5*  --   PLT 90*   < > 79*  --  96* 97*  --  96* 115*  --   --  119*  --   APTT 84*  --   --   --  62* 57*  --   --   --   --   --   --   --   LABPROT 16.9*  --   --   --  16.4* 16.7*  --   --   --   --   --   --   --   INR 1.4*  --   --   --  1.3* 1.4*  --   --   --   --   --   --   --   HEPARINUNFRC  --   --   --   --  0.24*  --  0.27*  --   --   --  0.34  --  0.26*  CREATININE 2.42*   < > 2.66*  --   --  2.75*  --   --  2.92*  --   --   --   --    < > = values in this interval not displayed.    Estimated Creatinine Clearance: 35.9 mL/min (A) (by C-G formula based on SCr of 2.92 mg/dL (H)).  Assessment: 68 yr old male admitted with massive PE and RV failure. Pt received 150 mg total of systemic tPA with continued shock, so ECMO started 12/8. EKOS started along with IV heparin, then repeated 12/10 with significant clot burden. RV function improving on ECHO 12/11 - holding off further tPA/thrombectomy.  Pt de-cannulated this AM without complications.  Heparin level drawn this evening was 0.34 units/ml, which is within the goal range for this patient. Per RN, no issues with IV or bleeding observed. H/H 8.5/25.0, platelets  115.  12/17 AM update:  Heparin level below goal this AM No issues per RN  Goal of Therapy:  Heparin level 0.3-0.5 units/mL for the first 24 hours post de-cannulation Monitor platelets by anticoagulation protocol: Yes    Plan:  Inc heparin to 1600 units/hr Re-check heparin level in 6-8 hours Monitor daily heparin level, CBC Monitor for signs/symptoms of bleeding  Narda Bonds, PharmD, BCPS Clinical Pharmacist Phone: [phone]

## 2019-02-02 NOTE — Progress Notes (Signed)
Patient was breathing 30 breaths per minute over the ventilator. Patient is maxed out on Fentanyl and Versed. PRN boluses for Fentanyl and Versed were given with no change. Homer notified. Orders were to increase titration of Versed up to 4mg /hr if needed. Will continue to monitor.

## 2019-02-02 NOTE — Progress Notes (Signed)
1 Day Post-Op Procedure(s) (LRB): DECANNULATION FOR ECMO (EXTRACORPOREAL MEMBRANE OXYGENATION); BILATERAL CHEST TUBE PLACEMENT; INSERTION OF TEMPORARY DIALYSIS CATHETER (N/A) TRANSESOPHAGEAL ECHOCARDIOGRAM (TEE) (N/A) CHEST TUBE INSERTION (Bilateral) Subjective: Patient hemodynamically stable after decannulation from [facility] ECMO yesterday Chest x-ray is clear Minimal serosanguineous drainage from axillary artery surgical site Overall edema is improved with CVVH No evidence of irreversible major organ dysfunction Objective: Vital signs in last 24 hours: Temp:  [95.5 F (35.3 C)-99 F (37.2 C)] 95.5 F (35.3 C) (12/17 1300) Pulse Rate:  [58-95] 72 (12/17 1300) Cardiac Rhythm: Normal sinus rhythm (12/17 1200) Resp:  [21-31] 27 (12/17 1300) BP: (95-113)/(46-52) 99/50 (12/17 1105) SpO2:  [89 %-99 %] 99 % (12/17 1300) Arterial Line BP: (91-140)/(43-61) 110/54 (12/17 1300) FiO2 (%):  [50 %-100 %] 70 % (12/17 1200) Weight:  [139.7 kg] 139.7 kg (12/17 0500)  Hemodynamic parameters for last 24 hours: CVP:  [5 mmHg-15 mmHg] 8 mmHg  Intake/Output from previous day: 12/16 0701 - 12/17 0700 In: 6182.6 [I.V.:4308.3; Blood:630; NG/GT:44.3; IV Piggyback:1200.1] Out: 1062 [IRSWN:4627; Drains:90; Stool:150; Blood:1500; Chest Tube:910] Intake/Output this shift: Total I/O In: 1024.8 [I.V.:768.6; NG/GT:156.3; IV Piggyback:99.9] Out: 1911 [Urine:80; Drains:10; Other:1731; Chest Tube:90]  Surgical site clean and dry Extremity edema improving Sedation being weaned slowly  Lab Results: Recent Labs    02/02/19 0517 02/02/19 1041 02/02/19 1050 02/02/19 1206  WBC 24.9* 25.8*  --   --   HGB 8.3* 8.4* 8.2* 8.2*  HCT 26.1* 26.5* 24.0* 24.0*  PLT 124* 134*  --   --    BMET:  Recent Labs    02/02/19 0517 02/02/19 0850 02/02/19 1050 02/02/19 1206  NA 142 142 142 141  K 4.3 4.1 4.3 4.3  CL 107 107  --   --   CO2 23 23  --   --   GLUCOSE 133* 129*  --   --   BUN 67* 63*  --   --   CREATININE  2.09* 2.15*  --   --   CALCIUM 7.6* 7.6*  --   --     PT/INR:  Recent Labs    02/02/19 0517  LABPROT 17.4*  INR 1.4*   ABG    Component Value Date/Time   PHART 7.439 02/02/2019 1206   HCO3 22.6 02/02/2019 1206   TCO2 24 02/02/2019 1206   ACIDBASEDEF 2.0 02/02/2019 1206   O2SAT 91.0 02/02/2019 1206   CBG (last 3)  Recent Labs    02/04/19 2358 02/02/19 0520 02/02/19 0730  GLUCAP 140* 147* 138*    Assessment/Plan: S/P Procedure(s) (LRB): DECANNULATION FOR ECMO (EXTRACORPOREAL MEMBRANE OXYGENATION); BILATERAL CHEST TUBE PLACEMENT; INSERTION OF TEMPORARY DIALYSIS CATHETER (N/A) TRANSESOPHAGEAL ECHOCARDIOGRAM (TEE) (N/A) CHEST TUBE INSERTION (Bilateral) Patient continues to show improvement with realistic chance of regaining good functional recovery   LOS: 10 days    Philip Wolfe 02/02/2019

## 2019-02-03 ENCOUNTER — Inpatient Hospital Stay (HOSPITAL_COMMUNITY): Payer: BC Managed Care – PPO

## 2019-02-03 DIAGNOSIS — I5023 Acute on chronic systolic (congestive) heart failure: Secondary | ICD-10-CM

## 2019-02-03 DIAGNOSIS — Z515 Encounter for palliative care: Secondary | ICD-10-CM

## 2019-02-03 DIAGNOSIS — R579 Shock, unspecified: Secondary | ICD-10-CM

## 2019-02-03 LAB — TYPE AND SCREEN
ABO/RH(D): A POS
Antibody Screen: NEGATIVE
Unit division: 0
Unit division: 0
Unit division: 0
Unit division: 0
Unit division: 0
Unit division: 0
Unit division: 0
Unit division: 0
Unit division: 0

## 2019-02-03 LAB — BPAM RBC
Blood Product Expiration Date: 202101052359
Blood Product Expiration Date: 202101052359
Blood Product Expiration Date: 202101052359
Blood Product Expiration Date: 202101082359
Blood Product Expiration Date: 202101132359
Blood Product Expiration Date: 202101142359
Blood Product Expiration Date: 202101152359
Blood Product Expiration Date: 202101152359
Blood Product Expiration Date: 202101152359
ISSUE DATE / TIME: 202012121713
ISSUE DATE / TIME: 202012121717
ISSUE DATE / TIME: 202012141236
ISSUE DATE / TIME: 202012150417
ISSUE DATE / TIME: 202012151252
ISSUE DATE / TIME: 202012160926
ISSUE DATE / TIME: 202012160926
Unit Type and Rh: 6200
Unit Type and Rh: 6200
Unit Type and Rh: 6200
Unit Type and Rh: 6200
Unit Type and Rh: 6200
Unit Type and Rh: 6200
Unit Type and Rh: 6200
Unit Type and Rh: 6200
Unit Type and Rh: 6200

## 2019-02-03 LAB — POCT I-STAT 7, (LYTES, BLD GAS, ICA,H+H)
Acid-Base Excess: 1 mmol/L (ref 0.0–2.0)
Bicarbonate: 25.1 mmol/L (ref 20.0–28.0)
Bicarbonate: 25.5 mmol/L (ref 20.0–28.0)
Calcium, Ion: 1.08 mmol/L — ABNORMAL LOW (ref 1.15–1.40)
Calcium, Ion: 1.17 mmol/L (ref 1.15–1.40)
HCT: 24 % — ABNORMAL LOW (ref 39.0–52.0)
HCT: 25 % — ABNORMAL LOW (ref 39.0–52.0)
Hemoglobin: 8.2 g/dL — ABNORMAL LOW (ref 13.0–17.0)
Hemoglobin: 8.5 g/dL — ABNORMAL LOW (ref 13.0–17.0)
O2 Saturation: 96 %
O2 Saturation: 97 %
Patient temperature: 36.2
Patient temperature: 36.5
Potassium: 4.4 mmol/L (ref 3.5–5.1)
Potassium: 4.8 mmol/L (ref 3.5–5.1)
Sodium: 138 mmol/L (ref 135–145)
Sodium: 139 mmol/L (ref 135–145)
TCO2: 26 mmol/L (ref 22–32)
TCO2: 27 mmol/L (ref 22–32)
pCO2 arterial: 36.6 mmHg (ref 32.0–48.0)
pCO2 arterial: 38.1 mmHg (ref 32.0–48.0)
pH, Arterial: 7.424 (ref 7.350–7.450)
pH, Arterial: 7.449 (ref 7.350–7.450)
pO2, Arterial: 74 mmHg — ABNORMAL LOW (ref 83.0–108.0)
pO2, Arterial: 85 mmHg (ref 83.0–108.0)

## 2019-02-03 LAB — CBC
HCT: 25.9 % — ABNORMAL LOW (ref 39.0–52.0)
HCT: 26.1 % — ABNORMAL LOW (ref 39.0–52.0)
HCT: 26.3 % — ABNORMAL LOW (ref 39.0–52.0)
Hemoglobin: 8 g/dL — ABNORMAL LOW (ref 13.0–17.0)
Hemoglobin: 8.4 g/dL — ABNORMAL LOW (ref 13.0–17.0)
Hemoglobin: 8.4 g/dL — ABNORMAL LOW (ref 13.0–17.0)
MCH: 30.5 pg (ref 26.0–34.0)
MCH: 30.8 pg (ref 26.0–34.0)
MCH: 31.1 pg (ref 26.0–34.0)
MCHC: 30.9 g/dL (ref 30.0–36.0)
MCHC: 31.9 g/dL (ref 30.0–36.0)
MCHC: 32.2 g/dL (ref 30.0–36.0)
MCV: 96.3 fL (ref 80.0–100.0)
MCV: 96.7 fL (ref 80.0–100.0)
MCV: 98.9 fL (ref 80.0–100.0)
Platelets: 131 10*3/uL — ABNORMAL LOW (ref 150–400)
Platelets: 137 10*3/uL — ABNORMAL LOW (ref 150–400)
Platelets: 144 10*3/uL — ABNORMAL LOW (ref 150–400)
RBC: 2.62 MIL/uL — ABNORMAL LOW (ref 4.22–5.81)
RBC: 2.7 MIL/uL — ABNORMAL LOW (ref 4.22–5.81)
RBC: 2.73 MIL/uL — ABNORMAL LOW (ref 4.22–5.81)
RDW: 17.7 % — ABNORMAL HIGH (ref 11.5–15.5)
RDW: 17.7 % — ABNORMAL HIGH (ref 11.5–15.5)
RDW: 17.9 % — ABNORMAL HIGH (ref 11.5–15.5)
WBC: 24.7 10*3/uL — ABNORMAL HIGH (ref 4.0–10.5)
WBC: 25.5 10*3/uL — ABNORMAL HIGH (ref 4.0–10.5)
WBC: 26.7 10*3/uL — ABNORMAL HIGH (ref 4.0–10.5)
nRBC: 1.7 % — ABNORMAL HIGH (ref 0.0–0.2)
nRBC: 2 % — ABNORMAL HIGH (ref 0.0–0.2)
nRBC: 2 % — ABNORMAL HIGH (ref 0.0–0.2)

## 2019-02-03 LAB — APTT: aPTT: 76 seconds — ABNORMAL HIGH (ref 24–36)

## 2019-02-03 LAB — PROTIME-INR
INR: 1.4 — ABNORMAL HIGH (ref 0.8–1.2)
Prothrombin Time: 17.3 seconds — ABNORMAL HIGH (ref 11.4–15.2)

## 2019-02-03 LAB — COMPREHENSIVE METABOLIC PANEL
ALT: 44 U/L (ref 0–44)
AST: 73 U/L — ABNORMAL HIGH (ref 15–41)
Albumin: 2.3 g/dL — ABNORMAL LOW (ref 3.5–5.0)
Alkaline Phosphatase: 92 U/L (ref 38–126)
Anion gap: 11 (ref 5–15)
BUN: 53 mg/dL — ABNORMAL HIGH (ref 8–23)
CO2: 22 mmol/L (ref 22–32)
Calcium: 8 mg/dL — ABNORMAL LOW (ref 8.9–10.3)
Chloride: 106 mmol/L (ref 98–111)
Creatinine, Ser: 1.89 mg/dL — ABNORMAL HIGH (ref 0.61–1.24)
GFR calc Af Amer: 42 mL/min — ABNORMAL LOW (ref >60)
GFR calc non Af Amer: 36 mL/min — ABNORMAL LOW (ref >60)
Glucose, Bld: 154 mg/dL — ABNORMAL HIGH (ref 70–99)
Potassium: 4.5 mmol/L (ref 3.5–5.1)
Sodium: 139 mmol/L (ref 135–145)
Total Bilirubin: 2.9 mg/dL — ABNORMAL HIGH (ref 0.3–1.2)
Total Protein: 5.7 g/dL — ABNORMAL LOW (ref 6.5–8.1)

## 2019-02-03 LAB — COOXEMETRY PANEL
Carboxyhemoglobin: 1.7 % — ABNORMAL HIGH (ref 0.5–1.5)
Methemoglobin: 2.1 % — ABNORMAL HIGH (ref 0.0–1.5)
O2 Saturation: 64.7 %
Total hemoglobin: 7.4 g/dL — ABNORMAL LOW (ref 12.0–16.0)

## 2019-02-03 LAB — GLUCOSE, CAPILLARY
Glucose-Capillary: 135 mg/dL — ABNORMAL HIGH (ref 70–99)
Glucose-Capillary: 141 mg/dL — ABNORMAL HIGH (ref 70–99)
Glucose-Capillary: 153 mg/dL — ABNORMAL HIGH (ref 70–99)
Glucose-Capillary: 180 mg/dL — ABNORMAL HIGH (ref 70–99)

## 2019-02-03 LAB — LACTATE DEHYDROGENASE: LDH: 404 U/L — ABNORMAL HIGH (ref 98–192)

## 2019-02-03 LAB — MAGNESIUM: Magnesium: 2.7 mg/dL — ABNORMAL HIGH (ref 1.7–2.4)

## 2019-02-03 LAB — PHOSPHORUS: Phosphorus: 4.2 mg/dL (ref 2.5–4.6)

## 2019-02-03 LAB — PROCALCITONIN: Procalcitonin: 4.71 ng/mL

## 2019-02-03 LAB — HEPARIN LEVEL (UNFRACTIONATED): Heparin Unfractionated: 0.37 IU/mL (ref 0.30–0.70)

## 2019-02-03 LAB — LACTIC ACID, PLASMA: Lactic Acid, Venous: 1.5 mmol/L (ref 0.5–1.9)

## 2019-02-03 MED ORDER — DIPHENHYDRAMINE HCL 50 MG/ML IJ SOLN: 25.0000 mg | INTRAMUSCULAR | Status: DC | PRN

## 2019-02-03 MED ORDER — LORAZEPAM 2 MG/ML PO CONC: 1.0000 mg | ORAL | Status: DC | PRN

## 2019-02-03 MED ORDER — ONDANSETRON HCL 4 MG/2ML IJ SOLN: 4.0000 mg | Freq: Four times a day (QID) | INTRAMUSCULAR | Status: DC | PRN

## 2019-02-03 MED ORDER — POLYVINYL ALCOHOL 1.4 % OP SOLN
1.0000 [drp] | Freq: Four times a day (QID) | OPHTHALMIC | Status: DC | PRN
  Filled 2019-02-03: qty 15

## 2019-02-03 MED ORDER — GLYCOPYRROLATE 0.2 MG/ML IJ SOLN: 0.2000 mg | INTRAMUSCULAR | Status: DC | PRN

## 2019-02-03 MED ORDER — FENTANYL 2500MCG IN NS 250ML (10MCG/ML) PREMIX INFUSION: 150.0000 ug/h | INTRAVENOUS | Status: DC

## 2019-02-03 MED ORDER — HALOPERIDOL LACTATE 2 MG/ML PO CONC
0.5000 mg | ORAL | Status: DC | PRN
  Filled 2019-02-03: qty 0.3

## 2019-02-03 MED ORDER — DEXTROSE 5 % IV SOLN: INTRAVENOUS | Status: DC

## 2019-02-03 MED ORDER — LORAZEPAM 1 MG PO TABS: 1.0000 mg | ORAL_TABLET | ORAL | Status: DC | PRN

## 2019-02-03 MED ORDER — GLYCOPYRROLATE 0.2 MG/ML IJ SOLN
0.4000 mg | Freq: Once | INTRAMUSCULAR | Status: AC
  Administered 2019-02-03: 0.4 mg via INTRAVENOUS
  Filled 2019-02-03: qty 2

## 2019-02-03 MED ORDER — MIDAZOLAM BOLUS VIA INFUSION
2.0000 mg | INTRAVENOUS | Status: DC | PRN
  Filled 2019-02-03: qty 4

## 2019-02-03 MED ORDER — IOHEXOL 350 MG/ML SOLN
100.0000 mL | Freq: Once | INTRAVENOUS | Status: AC | PRN
  Administered 2019-02-03: 100 mL via INTRAVENOUS

## 2019-02-03 MED ORDER — GLYCOPYRROLATE 1 MG PO TABS
1.0000 mg | ORAL_TABLET | ORAL | Status: DC | PRN
  Filled 2019-02-03: qty 1

## 2019-02-03 MED ORDER — LORAZEPAM 2 MG/ML IJ SOLN
1.0000 mg | INTRAMUSCULAR | Status: DC | PRN
  Administered 2019-02-03 (×2): 1 mg via INTRAVENOUS
  Filled 2019-02-03 (×2): qty 1

## 2019-02-03 MED ORDER — HALOPERIDOL LACTATE 5 MG/ML IJ SOLN: 0.5000 mg | INTRAMUSCULAR | Status: DC | PRN

## 2019-02-03 MED ORDER — LEVALBUTEROL HCL 1.25 MG/0.5ML IN NEBU
1.2500 mg | INHALATION_SOLUTION | Freq: Four times a day (QID) | RESPIRATORY_TRACT | Status: DC
  Administered 2019-02-03: 1.25 mg via RESPIRATORY_TRACT
  Filled 2019-02-03: qty 0.5

## 2019-02-03 MED ORDER — IPRATROPIUM-ALBUTEROL 0.5-2.5 (3) MG/3ML IN SOLN: 3.0000 mL | RESPIRATORY_TRACT | Status: DC | PRN

## 2019-02-03 MED ORDER — FENTANYL BOLUS VIA INFUSION
100.0000 ug | INTRAVENOUS | Status: DC | PRN
  Filled 2019-02-03: qty 200

## 2019-02-03 MED ORDER — HALOPERIDOL 0.5 MG PO TABS
0.5000 mg | ORAL_TABLET | ORAL | Status: DC | PRN
  Filled 2019-02-03: qty 1

## 2019-02-03 MED ORDER — ONDANSETRON 4 MG PO TBDP
4.0000 mg | ORAL_TABLET | Freq: Four times a day (QID) | ORAL | Status: DC | PRN
  Filled 2019-02-03: qty 1

## 2019-02-03 MED ORDER — BIOTENE DRY MOUTH MT LIQD: 15.0000 mL | OROMUCOSAL | Status: DC | PRN

## 2019-02-03 MED ORDER — FENTANYL BOLUS VIA INFUSION: 100.0000 ug | INTRAVENOUS | Status: DC | PRN

## 2019-02-06 ENCOUNTER — Telehealth: Payer: Self-pay

## 2019-02-06 LAB — ECHO INTRAOPERATIVE TEE
Height: 69 in
Weight: 5375.7 oz

## 2019-02-06 NOTE — Telephone Encounter (Signed)
02/06/19 Recd original DC from [employer]. Sent to [facility] for Dr. Tamala Julian to sign.  02/09/19 Recd signed DC from Dr. Tamala Julian. Faxed copy to [facility] [employer] and called them to pick up.

## 2019-02-17 NOTE — Progress Notes (Signed)
NAME:  Philip Wolfe, MRN:  [id number], DOB:  October 21, 1950, LOS: 90 ADMISSION DATE:  01/31/2019, CONSULTATION DATE:  02/02/2019 REFERRING MD:  Dr. Ralene Bathe EDP, CHIEF COMPLAINT:  Hypoxia   Brief History   69 year old male admitted 12/7 with massive PE s/p thrombolytics and catheter directed lysis.  Has cardiac arrest, significant RV failure requiring initiation of ECMO  Past Medical History   has a past medical history of Arthritis, Hypertension, Sleep apnea, Ulcer (left medial ankle), and Venous stasis ulcer ([place]).   Significant Hospital Events   12/7-Admit, intubated coded, given TPA 800 mg and then 50 mg 6 hours later 12/8- Cannulated for [facility] ECMO 02/11/2019 and underwent EKOS directed catheter placement.  Inhaled NO started 12/9- Significant epistaxis requiring 3 units PRBC and nasal packing by ENT, bronchoscopy with no evidence of pulmonary bleed 12/10- Second round of EKOS directed catheter TPA, nasal packing replaced by [facility] catheter by ENT 12/11-reviewed echo with cardiology.  RV function is improving.  Held off further lytics/thrombectomy 12/13-requiring daily blood transfusions.  No evidence of overt bleeding.  Pressors weaning down 12/14 TEE with flow wean 12/15 giapreza added.  12/17 going to OR for TEE and possible decannulation  12/18 Higher FiO2 demands today  Consults:  PCCM, cardiology, cardiothoracic surgery  Procedures:  R axillary arterial catheter, R femoral venous catheter, RIJ introducer, LIJ MML, L radial artery  Significant Diagnostic Tests:  CTA 12/7-massive bilateral central pulmonary emboli with significant decrease in pulmonary artery flow, patchy groundglass infiltrate, rib fractures on the right.  CT head 12/7-generalized atrophy, chronic ischemic microangiopathy.  Echo 12/9-severe reduction in RV systolic function with RV volume and pressure overload.  LVEF 50-55%  Micro Data:  Blood cultures 12/7-negative Blood culture 12/8-negative MRSA PCR  12/8-negative Respiratory culture 12/10-negative  Antimicrobials:  Cefepime 12/10 > Vanco 12/9 >> 12/13  Interim history/subjective:  Remains on vent, nitric, pressors. Will somewhat wake up and follow simple commands. Oxygen requirements up this morning.   Objective   Blood pressure 129/87, pulse 81, temperature (!) 97 F (36.1 C), resp. rate (!) 29, height 5\' 9"  (1.753 m), weight (!) 145.8 kg, SpO2 96 %. CVP:  [6 mmHg] 6 mmHg  Vent Mode: PRVC FiO2 (%):  [60 %-100 %] 90 % Set Rate:  [18 bmp] 18 bmp Vt Set:  [560 mL] 560 mL PEEP:  [12 cmH20] 12 cmH20 Plateau Pressure:  [24 cmH20-33 cmH20] 33 cmH20   Intake/Output Summary (Last 24 hours) at 02/11/2019 0840 Last data filed at 01/27/2019 0800 Gross per 24 hour  Intake 3161.95 ml  Output 6347 ml  Net -3185.05 ml   Filed Weights   01-Mar-2019 0500 02/02/19 0500 02/10/2019 0400  Weight: (!) 152.4 kg (!) 139.7 kg (!) 145.8 kg   Examination  General: critically ill male on vent.  HENT: Orally intubated Pulm: Coarse bilateral breath sounds Card/chest: RRR, no MRG Abd: soft, non-tender, hypoactive Ext: anasarca Neuro sedated: will arouse to verbal and nod head in understanding.  GU: Poor urine output, but is on CRRT.   Resolved Hospital Problem list     Assessment & Plan:   Acute cor pulmonale due to massive PE- S/p TPA x 2 and EKOS X 2, on heparin drip and VA ECMO.  Suspect this is PE on CTEPH given clinical history and response to therapy. Plan Heparin per pharmacy Will need life long AC if survives  Circulatory shock. Likely more vasodilatory/distributive than cardiogenic w/ low SVR initially but does have RV failure 2/2 PE.  VA ECMO discontinued 12/16. Plan Levophed for MAP goal 65 Inhaled nitric per CVTS Heart failure team following.   Acute hypoxic, hypercarbic respiratory failure: CXR largely clear. Hypoxia seems to be related to shunting at this point.  Plan Back to full vent support  Requiring increased  FiO2/PEEP. Wean as able to keep Sats > 90% VAP bundle Not a candidate for SVT Versed/fentanyl for RASS goal -1 to -2.   AKI 2/2 cardiorenal syndrome and ATN w/ Diffuse anasarca- related to reactive hypo-osmolar state, immobility, RV failure, and fluid administration. Plan Nephrology following CRRT started 12/16, pulling volume.   Ongoing transfusion-dependent anemia- LDH stable but low grade hemolysis inevitable, had significant nasopharyngeal bleeding but this now under control Plan Trend CBC Transfuse for hemoglobin < 7.   Leukocytosis- stable, reactive, no obvious infection. Cultures negative Plan  Monitor clinically  Thrombocytopenia- reactive and some consumption due to ECMO inevitable, looks okay today Plan Cont to trend  Some improvement now that ECMO stopped  Epistaxis- packed by ENT with good response, appreciate help Plan Packing per ENT Cefepime : to continue as long as nasal packing in place. Out on 12/17. DC cefepime    Hyperglyemia Plan SSI  Best practice Nutrition: tubefeeds PUD: PPI VTE: IV heparin Sedation protocol Family: Family updated at length.  VAP protocol   Critical care time 45 mins   Joneen Roach, AGACNP-BC [place] [facility]  See Amion for personal pager PCCM on call pager [phone]  February 16, 2019 8:40 AM

## 2019-02-17 NOTE — Progress Notes (Signed)
Transported PT to CT on vent.   

## 2019-02-17 NOTE — Progress Notes (Signed)
   02/12/2019 1700  Clinical Encounter Type  Visited With Patient;Health care provider  Visit Type Initial;Spiritual support;Social support;Patient actively dying  Referral From Palliative care team  Spiritual Encounters  Spiritual Needs Emotional;Prayer  Stress Factors  Patient Stress Factors Major life changes   Present at request of family via PMT, prayed several times at bedside, present at extubation and stayed for a bit as compassionate presence.  Let RN know I remain available and also let night chaplain on call know.  Temple Pacini [place], [phone]

## 2019-02-17 NOTE — Procedures (Signed)
Extubation Procedure Note  Patient Details:   Name: Philip Wolfe DOB: 05-03-1950 MRN: [id number]   Airway Documentation:    Vent end date: 01/19/2019 Vent end time: 1548   Evaluation  O2 sats: transiently fell during during procedure Complications: Complications of sats falling Patient did not tolerate procedure well. Bilateral Breath Sounds: Rhonchi   No  Was a terminal extubation   Rishabh Rinkenberger, Leonie Douglas 02/10/2019, 3:49 PM

## 2019-02-17 NOTE — Progress Notes (Addendum)
Daily Progress Note   Patient Name: Philip Wolfe       Date: 02/08/2019 DOB: 1950/08/05  Age: 69 y.o. MRN#: [id number] Attending Physician: Philip Glass, MD Primary Care Physician: Philip Rud, MD Admit Date: 01/26/2019  Reason for Consultation/Follow-up: Establishing goals of care and Psychosocial/spiritual support  Received call from RN, Philip Wolfe that wife requested I call.   Briefly discussed patient with Dr. Donata Clay, [facility] ECMO Coordinator and bedside RN Philip Wolfe prior to speaking with family.  Subjective: Spoke with Philip Wolfe (Wife), Philip Wolfe (Son) and Philip Wolfe (Dtr) over the phone.  Philip Wolfe explained that the family made a decision for comfort yesterday but received multiple calls from MDs and they were persuaded to continue care for 48 hours and then reassess.  Philip Wolfe said the family has had a change of heart they can not in good conscious continue care for Philip Wolfe.  Philip Wolfe would not have wanted intubation or aggressive medical can.  He was clear to Philip Wolfe and the family that he wanted a natural death and he wanted to be comfortable.  Philip Wolfe feels that she is already "in trouble" with Philip Wolfe because she pushed his care past what he would have wanted.  Each of the family members in turn explained that over the past 12 - 18 months Philip Wolfe had declined from a functional perspective and his personality had changed.  He was living a bed to chair existence at home.  Philip Wolfe stated that she always had to push Philip Wolfe to care for himself - he simply was not motivated to and would not care for himself without tremendous effort from Philip Wolfe.   He made arrangements for Philip Wolfe to have access to financial issues that he normally handled.  He had become severely depressed.  It was as though he was preparing to die.  Philip Wolfe described listening to  Philip Wolfe in pain in bed at night.   Philip Wolfe implored him to seek medical attention but he refused.  We discussed his current CT scan and the continued clot burden in his right lung.  They only want to improve his situation is with another procedure either here (IR - flow retriever) or at [facility] (open thrombectomy).  They clot is chronic and very adherent.  The family did not wish to proceed with further procedures.  The three family members were  in agreement and firm that Philip Wolfe's goal of care needed to be shifted to comfort as soon as possible and he be allowed to finally rest.  The family will not come to the hospital.  They feel they have already said their goodbyes.   Philip Wolfe asked for a Chaplain to visit.  Philip Wolfe liked Christmas music and it would be nice if that was playing.  The family expressed repeatedly that they were very grateful for the excellent care Philip Wolfe received at [facility].    Assessment:  Patient currently intubated on pressors and CVVHD.  Oxygenation worsening today.    Patient Profile/HPI:  Philip Wolfe is a 69 y.o. male with multiple medical problems including morbid obesity, who was admitted to the hospital 01/7019 with hypoxic respiratory failure and found to have massive bilateral PE.  Patient subsequently suffered PEA arrest in the ER and was intubated. He was found to have cardiogenic shock with RV failure. Patient is s/p TPA. He was started on [place] ECMO on 12/8.  He was able to be decannulated from ECMO but remained intubated and on CRRT.    Length of Stay: 11  Current Medications: Scheduled Meds:  . Chlorhexidine Gluconate Cloth  6 each Topical Daily  . mouth rinse  15 mL Mouth Rinse 10 times per day  . pantoprazole sodium  40 mg Per Tube BID  . sodium chloride flush  10-40 mL Intracatheter Q12H  . sodium chloride flush  3 mL Intravenous Q12H    Continuous Infusions: . dextrose    . fentaNYL infusion INTRAVENOUS    . midazolam 1 mg/hr (2019-02-09 1302)    PRN  Meds: antiseptic oral rinse, diphenhydrAMINE, fentaNYL, glycopyrrolate **OR** glycopyrrolate **OR** glycopyrrolate, glycopyrrolate **OR** glycopyrrolate **OR** glycopyrrolate, haloperidol **OR** haloperidol **OR** haloperidol lactate, ipratropium-albuterol, LORazepam **OR** [DISCONTINUED] LORazepam **OR** LORazepam, midazolam, ondansetron **OR** ondansetron (ZOFRAN) IV, polyvinyl alcohol, polyvinyl alcohol, sodium chloride flush, sodium chloride flush  Physical Exam        Well developed obese man intubated.  Eyelids flutter when I call his name and touch his shoulder. CV rrr resp intubated  Abdomen obese, distended but not tight  Vital Signs: BP 129/87   Pulse 98   Temp (!) 97.2 F (36.2 C)   Resp 17   Ht 5\' 9"  (1.753 m)   Wt (!) 145.8 kg   SpO2 (!) 57%   BMI 47.47 kg/m  SpO2: SpO2: (!) 57 % O2 Device: O2 Device: Ventilator O2 Flow Rate: O2 Flow Rate (L/min): 15 L/min  Intake/output summary:   Intake/Output Summary (Last 24 hours) at 2019-02-09 1727 Last data filed at 2019-02-09 1400 Gross per 24 hour  Intake 2628.32 ml  Output 5311 ml  Net -2682.68 ml   LBM: Last BM Date: 01/27/2019 Baseline Weight: Weight: (!) 137 kg Most recent weight: Weight: (!) 145.8 kg       Palliative Assessment/Data: 10%      Patient Active Problem List   Diagnosis Date Noted  . Shock ([place])   . Cardiac arrest due to respiratory disorder ([place]) 02/02/2019  . Personal history of ECMO   . DNR (do not resuscitate)   . Pressure injury of skin 01/31/2019  . PE (pulmonary thromboembolism) ([place])   . Acute saddle pulmonary embolism with acute cor pulmonale (HCC)   . Palliative care encounter   . Acute respiratory failure with hypoxia ([place]) 02/16/2019  . Ankle ulcer ([place]) 07/31/2010  . Leg pain 07/31/2010  . Varicose veins of lower extremities with other complications 93/79/0240  Palliative Care Plan    Recommendations/Plan:  Full comfort.  Will DC interventions not aimed at comfort and  add in comfort medications.  Discussed with Philip Wolfe.  Goals of Care and Additional Recommendations:  Limitations on Scope of Treatment: Full Comfort Care  Code Status:  DNR  Prognosis:   Hours - Days.  Patient passed away peacefully at 1423 today.  Discharge Planning:  Anticipated Hospital Death  Care plan was discussed with Drs. Donata ClayVan Trigt and Shirlee LatchMcLean, family, ECMO coordinator, ICU RN  Thank you for allowing the Palliative Medicine Team to assist in the care of this patient.  Total time spent:  60 min.     Greater than 50%  of this time was spent counseling and coordinating care related to the above assessment and plan.  Norvel RichardsMarianne Kamani Lewter, PA-C Palliative Medicine  Please contact Palliative MedicineTeam phone at [phone] for questions and concerns between 7 am - 7 pm.   Please see AMION for individual provider pager numbers.

## 2019-02-17 NOTE — Progress Notes (Signed)
Pt continues to breath over vent at 30 breaths per minute along with significant diaphragmatic breathing. Currently on 100% FiO2 & 12 PEEP. Maxed out on versed and fentanyl infusion at 250 mcg/min. ABG obtained and pO2 improved from 54 to 74. Dr. Orvan Seen notified as well as Dr. Emmit Alexanders.   No new orders at this time.   Shirl Harris, RN

## 2019-02-17 NOTE — Progress Notes (Signed)
2 Days Post-Op Procedure(s) (LRB): DECANNULATION FOR ECMO (EXTRACORPOREAL MEMBRANE OXYGENATION); BILATERAL CHEST TUBE PLACEMENT; INSERTION OF TEMPORARY DIALYSIS CATHETER (N/A) TRANSESOPHAGEAL ECHOCARDIOGRAM (TEE) (N/A) CHEST TUBE INSERTION (Bilateral) Patient has developed worsening oxygenation over the past 12 hours, FiO2 now up to 90% on 12 of PEEP with PO2 of 84. Coox satisfactory at 65% with minimal pressors Patient on lower doses of sedation breathing spontaneously and somewhat tachypneic Minimal chest tube output Chest x-ray today looks fairly clear with somewhat low volumes Drain from axillary artery incision with scant serosanguineous drainage Patient is afebrile with stable white count elevation 25,000, previous tracheal aspirate normal flora Objective: Vital signs in last 24 hours: Temp:  [95.5 F (35.3 C)-97.7 F (36.5 C)] 96.8 F (36 C) (12/18 0900) Pulse Rate:  [58-86] 83 (12/18 0900) Cardiac Rhythm: Normal sinus rhythm (12/18 0800) Resp:  [21-32] 26 (12/18 0900) BP: (99-129)/(50-87) 129/87 (12/18 0745) SpO2:  [89 %-100 %] 95 % (12/18 0900) Arterial Line BP: (92-130)/(48-64) 117/56 (12/18 0900) FiO2 (%):  [60 %-100 %] 90 % (12/18 0745) Weight:  [145.8 kg] 145.8 kg (12/18 0400)  Hemodynamic parameters for last 24 hours: CVP:  [6 mmHg] 6 mmHg  Intake/Output from previous day: 12/17 0701 - 12/18 0700 In: 3195.4 [I.V.:2104.5; NG/GT:891; IV Piggyback:199.9] Out: 6587 [Urine:160; Emesis/NG output:30; Drains:30; Chest Tube:210] Intake/Output this shift: Total I/O In: 324.9 [I.V.:174.9; NG/GT:150] Out: 425 [Urine:15; Other:410]  Sedated on ventilator Inspiratory wheezing Peripheral edema improved with CVVH Surgical dressing clean and dry over right axillary artery  Lab Results: Recent Labs    02/02/19 2351 02/12/2019 0317 02/06/2019 0418  WBC 25.5* 26.7*  --   HGB 8.4*  8.5* 8.4* 8.2*  HCT 26.1*  25.0* 26.3* 24.0*  PLT 137* 144*  --    BMET:  Recent Labs   02/02/19 1554 02/06/2019 0317 01/22/2019 0418  NA 142 139 138  K 4.7 4.5 4.4  CL 108 106  --   CO2 22 22  --   GLUCOSE 158* 154*  --   BUN 58* 53*  --   CREATININE 1.90* 1.89*  --   CALCIUM 7.9* 8.0*  --     PT/INR:  Recent Labs    02/13/2019 0317  LABPROT 17.3*  INR 1.4*   ABG    Component Value Date/Time   PHART 7.424 02/16/2019 0418   HCO3 25.1 02/02/2019 0418   TCO2 26 01/31/2019 0418   ACIDBASEDEF 1.0 02/02/2019 1900   O2SAT 97.0 01/18/2019 0418   CBG (last 3)  Recent Labs    02/02/19 2028 02/02/19 2351 02/16/2019 0326  GLUCAP 159* 135* 141*    Assessment/Plan: S/P Procedure(s) (LRB): DECANNULATION FOR ECMO (EXTRACORPOREAL MEMBRANE OXYGENATION); BILATERAL CHEST TUBE PLACEMENT; INSERTION OF TEMPORARY DIALYSIS CATHETER (N/A) TRANSESOPHAGEAL ECHOCARDIOGRAM (TEE) (N/A) CHEST TUBE INSERTION (Bilateral) With increasing oxygen demand and clear chest x-ray on therapeutic heparin,  concerning for recurrent pulmonary emboli.  Discussed with Dr. Aundra Dubin and CTA will be performed for assessment.   LOS: 11 days    Philip Wolfe 01/31/2019

## 2019-02-17 NOTE — Discharge Summary (Signed)
NAME:  Philip Wolfe, MRN:  [id number], DOB:  04-09-1950, LOS: 14 ADMISSION DATE:  01/22/2019, CONSULTATION DATE:  02/07/2019 REFERRING MD:  Dr. Ralene Bathe EDP, CHIEF COMPLAINT:  Hypoxia   Brief History   69 year old male admitted 12/7 with massive PE s/p thrombolytics and catheter directed lysis.  Had cardiac arrest, significant RV failure requiring initiation of ECMO  Past Medical History   has a past medical history of Arthritis, Hypertension, Sleep apnea, Ulcer (left medial ankle), and Venous stasis ulcer ([place]).   Significant Hospital Events   12/7-Admit, intubated coded, given TPA 800 mg and then 50 mg 6 hours later 12/8- Cannulated for [facility] ECMO 02/07/2019 and underwent EKOS directed catheter placement.  Inhaled NO started 12/9- Significant epistaxis requiring 3 units PRBC and nasal packing by ENT, bronchoscopy with no evidence of pulmonary bleed 12/10- Second round of EKOS directed catheter TPA, nasal packing replaced by [facility] catheter by ENT 12/11-reviewed echo with cardiology.  RV function is improving.  Held off further lytics/thrombectomy 12/13-requiring daily blood transfusions.  No evidence of overt bleeding.  Pressors weaning down 12/14 TEE with flow wean 12/15 giapreza added.  12/17 going to OR for TEE and possible decannulation  12/18 Higher FiO2 demands   Consults:  PCCM, cardiology, cardiothoracic surgery  Procedures:  R axillary arterial catheter, R femoral venous catheter, RIJ introducer, LIJ MML, L radial artery  Significant Diagnostic Tests:  CTA 12/7-massive bilateral central pulmonary emboli with significant decrease in pulmonary artery flow, patchy groundglass infiltrate, rib fractures on the right.  CT head 12/7-generalized atrophy, chronic ischemic microangiopathy.  Echo 12/9-severe reduction in RV systolic function with RV volume and pressure overload.  LVEF 50-55%  Micro Data:  Blood cultures 12/7-negative Blood culture 12/8-negative MRSA PCR  12/8-negative Respiratory culture 12/10-negative  Antimicrobials:  Cefepime 12/10 > Vanco 12/9 >> 12/13   Resolved Hospital Problem list     Assessment & Plan:   Acute cor pulmonale due to massive PE- S/p TPA x 2 and EKOS X 2, on heparin drip and [facility] ECMO.  Suspect this is PE on CTEPH given clinical history and response to therapy. Plan Heparin per pharmacy   Circulatory shock. Likely more vasodilatory/distributive than cardiogenic w/ low SVR initially but does have RV failure 2/2 PE. [facility] ECMO discontinued 12/16. Plan Levophed for MAP goal 65 Inhaled nitric per CVTS Heart failure team following.   Acute hypoxic, hypercarbic respiratory failure: CXR largely clear. Hypoxia seems to be related to shunting at this point.  Plan Back to full vent support  Requiring increased FiO2/PEEP. Wean as able to keep Sats > 90% Versed/fentanyl for RASS goal -1 to -2.   AKI 2/2 cardiorenal syndrome and ATN w/ Diffuse anasarca- related to reactive hypo-osmolar state, immobility, RV failure, and fluid administration. Plan Nephrology following CRRT started 12/16, pulling volume.    Thrombocytopenia- reactive and some consumption due to ECMO inevitable Plan Cont to trend  Some improvement now that ECMO stopped  Epistaxis- packed by ENT with good response, appreciate help Plan Packing per ENT Cefepime : to continue as long as nasal packing in place. Out on 12/17. DC cefepime  COURSE -he seemed to improve somewhat, came off ECMO and was on CRRT but then developed worsening hypoxia.  Family expressed that he would never want this level of care.  After multiple discussions with palliative care, all team members including cardiology and T CTS were involved.  Finally family opted to withdraw life support and he passed away soon after.  Cause of death-massive pulmonary embolism, acute cor pulmonale, obstructive shock, acute kidney injury   Javyn Havlin V. Vassie Loll MD 02/07/2019 4:15 PM

## 2019-02-17 NOTE — Progress Notes (Signed)
[facility] NEPHROLOGY PROGRESS NOTE  Assessment/ Plan: Pt is a 69 y.o. yo male  who was admitted on 12/7 with massive pulmonary embolism status post thrombolytics and catheter directed lysis, had a cardiac arrest with significant right ventricular failure requiring ECMO since 12/8-12/16, seen as a consultation for AKI and anasarca.  #Acute kidney injury, nonoliguric: Multifactorial etiology including contrast nephropathy, cardiogenic shock/arrest. -CT scan showed no hydronephrosis however has exophytic lesion on right kidneys. -Started CRRT on 12/16 for fluid overload refractory to IV diuretics. -Left subclavian was placed on 12/16. -CRRT with 4K bath.  Able to UF 150 to 200 cc an hour.  Still volume overload.  Continue CRRT. -Monitor electrolytes, potassium, calcium.  #Anasarca: Failed IV diuretics.  Try to attempt UF as tolerated.   #Cardiogenic shock: On multiple pressors.  Maintain acceptable MAP.  #Massive pulmonary embolism with cor pulmonale: Status post TPA and thrombectomy.  On IV heparin.  As per pulmonary team.  Subjective: Seen and examined at bedside.  Total UF around 6157 cc.  On 2 pressors and BP is acceptable.  No other event.  Objective Vital signs in last 24 hours: Vitals:   02/22/19 0600 2019-02-22 0700 22-Feb-2019 0745 2019-02-22 0800  BP:   129/87   Pulse: 66 64 81 81  Resp: (!) 21 (!) 25 (!) 24 (!) 29  Temp: (!) 97 F (36.1 C) (!) 97 F (36.1 C)  (!) 97 F (36.1 C)  TempSrc:    (P) Bladder  SpO2: 98% 100% 98% 96%  Weight:      Height:       Weight change: 6.1 kg  Intake/Output Summary (Last 24 hours) at 2019-02-22 0842 Last data filed at Feb 22, 2019 0800 Gross per 24 hour  Intake 3161.95 ml  Output 6533 ml  Net -3371.05 ml       Labs: Basic Metabolic Panel: Recent Labs  Lab 02/02/19 0850 02/02/19 0850 02/02/19 0903 02/02/19 1554 02/02/19 2351 Feb 22, 2019 0317 02-22-19 0418  NA 142   < >  --  142 139 139 138  K 4.1   < >  --   4.7 4.8 4.5 4.4  CL 107  --   --  108  --  106  --   CO2 23  --   --  22  --  22  --   GLUCOSE 129*  --   --  158*  --  154*  --   BUN 63*  --   --  58*  --  53*  --   CREATININE 2.15*  --   --  1.90*  --  1.89*  --   CALCIUM 7.6*  --   --  7.9*  --  8.0*  --   PHOS 4.3  --  4.3 4.5  --  4.2  --    < > = values in this interval not displayed.   Liver Function Tests: Recent Labs  Lab 02/02/19 0517 02/02/19 0850 02/02/19 1554 22-Feb-2019 0317  AST 85*  --  79* 73*  ALT 43  --  44 44  ALKPHOS 101  --  94 92  BILITOT 2.4*  --  2.9* 2.9*  PROT 4.9*  --  5.3* 5.7*  ALBUMIN 2.3* 2.3* 2.3* 2.3*   No results for input(s): LIPASE, AMYLASE in the last 168 hours. No results for input(s): AMMONIA in the last 168 hours. CBC: Recent Labs  Lab 01/31/2019 0455 02/02/2019 0510 02/02/19 0517 02/02/19 1041 02/02/19 1554 02/02/19 2351 02-22-2019 9326  02-24-19 0418  WBC 24.3*   < > 24.9* 25.8* 25.5* 25.5* 26.7*  --   NEUTROABS 16.7*  --   --   --   --   --   --   --   HGB 8.3*  --  8.3* 8.4* 8.5* 8.4*  8.5* 8.4* 8.2*  HCT 24.4*  --  26.1* 26.5* 26.3* 26.1*  25.0* 26.3* 24.0*  MCV 92.1   < > 96.0 96.7 95.6 96.7 96.3  --   PLT 72*   < > 124* 134* 134* 137* 144*  --    < > = values in this interval not displayed.   Cardiac Enzymes: No results for input(s): CKTOTAL, CKMB, CKMBINDEX, TROPONINI in the last 168 hours. CBG: Recent Labs  Lab 02/02/19 1138 02/02/19 1558 02/02/19 2028 02/02/19 2351 02-24-19 0326  GLUCAP 158* 163* 159* 135* 141*    Iron Studies: No results for input(s): IRON, TIBC, TRANSFERRIN, FERRITIN in the last 72 hours. Studies/Results: DG Chest 1 View  Result Date: 02/02/2019 CLINICAL DATA:  Chest tube present EXAM: CHEST  1 VIEW COMPARISON:  Radiograph yesterday.  Chest CT from 3 days ago FINDINGS: Endotracheal tube tip is between the clavicular heads and carina. 2 left-sided central lines with tip at the left brachiocephalic and brachiocephalic SVC confluence. Chest  tubes in place. Lung volumes are low and there is hazy opacity likely reflecting atelectasis. No definite pneumothorax. Stable heart size. IMPRESSION: 1. New left subclavian line with tip at the brachiocephalic SVC confluence. 2. Pre-existing hardware in stable position. 3. Unchanged low volume chest with atelectasis/pleural fluid at the bases. Electronically Signed   By: Monte Fantasia M.D.   On: 02/02/2019 07:43   DG Abd 1 View  Result Date: 02/02/2019 CLINICAL DATA:  Orogastric tube placement EXAM: ABDOMEN - 1 VIEW COMPARISON:  Portable exam 0842 hours compared to CT abdomen and pelvis 01/19/2019 FINDINGS: Tip of orogastric tube projects over proximal stomach. Laparoscopic gastric band oriented in expected 2:00 to 8:00 plane. BILATERAL thoracostomy tubes. Normal bowel gas pattern. Degenerative disc disease changes thoracolumbar spine. IMPRESSION: Tip of nasogastric tube projects over proximal stomach. Laparoscopic gastric band. Electronically Signed   By: Lavonia Dana M.D.   On: 02/02/2019 08:49   DG Chest Port 1 View  Result Date: 02/24/19 CLINICAL DATA:  Endotracheal tube and chest tube placement. CABG 2 days ago. EXAM: PORTABLE CHEST 1 VIEW COMPARISON:  One-view chest x-ray 02/02/2019 FINDINGS: Endotracheal tube remains in place, 3.4 cm above the carina. A left subclavian line is stable. NG tube courses off the inferior border of the film. Bilateral chest tubes remain in place. There is no pneumothorax. Small effusions and bibasilar airspace disease are stable. Left upper lobe airspace opacities are new. IMPRESSION: 1. New left upper lobe airspace disease. While this likely reflects atelectasis, infection is not excluded. This could represent focal atelectasis. 2. Stable bilateral chest tubes without pneumothorax. 3. Stable small bilateral pleural effusions and bibasilar airspace disease. Electronically Signed   By: San Morelle M.D.   On: 2019/02/24 08:01   DG Chest Port 1 View  Result  Date: 02/12/2019 CLINICAL DATA:  Status post ECMO cannula removal EXAM: PORTABLE CHEST 1 VIEW COMPARISON:  02/08/2019 FINDINGS: Cardiac shadow remains enlarged. Endotracheal tube and bilateral thoracostomy catheters are noted in satisfactory position. No pneumothorax is seen. Left jugular central line is noted and stable. Previously seen ECMO cannula has been withdrawn. No new focal abnormality is noted. Left subclavian temporary dialysis catheter is seen extending into  the superior vena cava. IMPRESSION: No pneumothorax with bilateral chest tubes. Tubes and lines as described above. Electronically Signed   By: Inez Catalina M.D.   On: 02/09/2019 12:47   [place] EKG SITE RITE  Result Date: 02/02/2019 If Site Rite image not attached, placement could not be confirmed due to current cardiac rhythm.   Medications: Infusions: .  prismasol BGK 4/2.5 500 mL/hr at Feb 11, 2019 1423  .  prismasol BGK 4/2.5 400 mL/hr at 11-Feb-2019 0738  . sodium chloride 10 mL/hr at 01/28/2019 0800  . ceFEPime (MAXIPIME) IV Stopped (02/02/19 2158)  . feeding supplement (VITAL 1.5 CAL) 45 mL/hr at 2019/02/11 0700  . fentaNYL infusion INTRAVENOUS 150 mcg/hr (2019-02-11 0700)  . heparin 1,800 Units/hr (11-Feb-2019 0800)  . midazolam 3 mg/hr (02-11-2019 0700)  . norepinephrine (LEVOPHED) Adult infusion 44 mcg/min (02-11-2019 0800)  . prismasol BGK 4/2.5 2,000 mL/hr at 02-11-19 0736  . vasopressin (PITRESSIN) infusion - *FOR SHOCK* 0.03 Units/min (02-11-2019 0800)    Scheduled Medications: . sodium chloride   Intravenous Once  . sodium chloride   Intravenous Once  . B-complex with vitamin C  1 tablet Oral Daily  . bisacodyl  10 mg Oral Daily   Or  . bisacodyl  10 mg Rectal Daily  . chlorhexidine gluconate (MEDLINE KIT)  15 mL Mouth Rinse BID  . Chlorhexidine Gluconate Cloth  6 each Topical Daily  . feeding supplement (PRO-STAT SUGAR FREE 64)  30 mL Per Tube TID  . insulin aspart  0-20 Units Subcutaneous Q4H  . insulin glargine  14 Units  Subcutaneous Q24H  . mouth rinse  15 mL Mouth Rinse 10 times per day  . pantoprazole sodium  40 mg Per Tube BID  . sildenafil  20 mg Per Tube TID  . sodium chloride flush  10-40 mL Intracatheter Q12H  . sodium chloride flush  3 mL Intravenous Q12H    have reviewed scheduled and prn medications.  Physical Exam: Unchanged General: Critically ill male, intubated, sedated, anasarca Heart:RRR, s1s2 nl Lungs: Coarse breath sound bilateral Abdomen:soft, Non-tender, non-distended Extremities: Gross anasarca.  No leg cyanosis or clubbing Dialysis Access: Left subclavian catheter placed on 12/16.  Lysa Livengood Prasad Chalmers Iddings 02-11-2019,8:42 AM  LOS: 11 days  Pager: [phone]

## 2019-02-17 NOTE — Progress Notes (Signed)
Patient ID: Philip Wolfe, male   DOB: August 21, 1950, 69 y.o.   MRN: [id number] ]    Advanced Heart Failure Rounding Note  PCP-Cardiologist: No primary care provider on file.   Subjective:    - Massive PE with cardiogenic shock: [facility] ECMO initiated 12/8. Patient then had PA catheter-directed tPA until 8 am 12/9.  Left leg DVT.  - 12/9 ENT Merocel packing for severe epistaxis.  - PA catheter-directed tPA 12/10 again.   - TEE-guided ECMO wean on 12/14, initially did well with flow as low as 1 L/min, left at 2.5 L/min.  However, overnight drop in MAP with increase NE so increased flow back to 4.5.  - ECMO decannulated 12/16.  RV appeared normal in size with normal systolic function on TEE. CVVH started 12/16.   Now on norepinephrine 32 + vasopression 0.03.  MAP around 80 (pressors increased with heavier sedate).  CVP 7.  CVVH running net negative UF 100-150 cc/hr, net negative 3.3 L.  Still makes about 10 cc/hr urine.  Heparin gtt ongoing with therapeutic level. iNO 20 currently.  Co-ox 65% this morning.   Overnight, FiO2 had to be increased, now on 0.9.  CXR with new LUL infiltrate.   Empiric cefepime ongoing, afebrile, cultures remain negative.  WBCs 26.7 today.   Sedated on Versed/Fentanyl. He will respond and follow commands when sedation weaned.    Objective:   Weight Range: (!) 145.8 kg Body mass index is 47.47 kg/m.   Vital Signs:   Temp:  [95.5 F (35.3 C)-97.7 F (36.5 C)] 96.8 F (36 C) (12/18 0900) Pulse Rate:  [58-86] 83 (12/18 0900) Resp:  [21-32] 26 (12/18 0900) BP: (99-129)/(50-87) 129/87 (12/18 0745) SpO2:  [89 %-100 %] 95 % (12/18 0900) Arterial Line BP: (92-130)/(48-64) 117/56 (12/18 0900) FiO2 (%):  [60 %-100 %] 90 % (12/18 0745) Weight:  [145.8 kg] 145.8 kg (12/18 0400) Last BM Date: 01/19/2019  Weight change: Filed Weights   01/29/2019 0500 02/02/19 0500 Mar 02, 2019 0400  Weight: (!) 152.4 kg (!) 139.7 kg (!) 145.8 kg    Intake/Output:   Intake/Output Summary  (Last 24 hours) at 03/02/19 0958 Last data filed at 2019/03/02 0934 Gross per 24 hour  Intake 3146.59 ml  Output 6497 ml  Net -3350.41 ml      Physical Exam    General: Intubated/sedated.  Neck: No JVD, no thyromegaly or thyroid nodule.  Lungs: Coarse BS bilaterally.  CV: Nondisplaced PMI.  Heart regular S1/S2, no S3/S4, no murmur.  Anasarca, some improvement.   Abdomen: Soft, nontender, no hepatosplenomegaly, no distention.  Skin: Intact without lesions or rashes.  Neurologic: Currently sedated but per nurse will awaken and follow commands.   Extremities: No clubbing or cyanosis.  HEENT: Normal.    Telemetry   NSR 80s-90s (personally reviewed)  Labs    CBC Recent Labs    02/02/19 2351 03/02/2019 0317 03/02/19 0418  WBC 25.5* 26.7*  --   HGB 8.4*  8.5* 8.4* 8.2*  HCT 26.1*  25.0* 26.3* 24.0*  MCV 96.7 96.3  --   PLT 137* 144*  --    Basic Metabolic Panel Recent Labs    02/02/19 1554 03-02-19 0317 2019-03-02 0418  NA 142 139 138  K 4.7 4.5 4.4  CL 108 106  --   CO2 22 22  --   GLUCOSE 158* 154*  --   BUN 58* 53*  --   CREATININE 1.90* 1.89*  --   CALCIUM 7.9* 8.0*  --  MG 2.4 2.7*  --   PHOS 4.5 4.2  --    Liver Function Tests Recent Labs    02/02/19 1554 02/09/19 0317  AST 79* 73*  ALT 44 44  ALKPHOS 94 92  BILITOT 2.9* 2.9*  PROT 5.3* 5.7*  ALBUMIN 2.3* 2.3*   No results for input(s): LIPASE, AMYLASE in the last 72 hours. Cardiac Enzymes No results for input(s): CKTOTAL, CKMB, CKMBINDEX, TROPONINI in the last 72 hours.  BNP: BNP (last 3 results) Recent Labs    02/08/2019 1505  BNP 57.6    ProBNP (last 3 results) No results for input(s): PROBNP in the last 8760 hours.   D-Dimer No results for input(s): DDIMER in the last 72 hours. Hemoglobin A1C No results for input(s): HGBA1C in the last 72 hours. Fasting Lipid Panel No results for input(s): CHOL, HDL, LDLCALC, TRIG, CHOLHDL, LDLDIRECT in the last 72 hours. Thyroid Function  Tests No results for input(s): TSH, T4TOTAL, T3FREE, THYROIDAB in the last 72 hours.  Invalid input(s): FREET3  Other results:   Imaging    DG Chest Port 1 View  Result Date: 02/09/2019 CLINICAL DATA:  Endotracheal tube and chest tube placement. CABG 2 days ago. EXAM: PORTABLE CHEST 1 VIEW COMPARISON:  One-view chest x-ray 02/02/2019 FINDINGS: Endotracheal tube remains in place, 3.4 cm above the carina. A left subclavian line is stable. NG tube courses off the inferior border of the film. Bilateral chest tubes remain in place. There is no pneumothorax. Small effusions and bibasilar airspace disease are stable. Left upper lobe airspace opacities are new. IMPRESSION: 1. New left upper lobe airspace disease. While this likely reflects atelectasis, infection is not excluded. This could represent focal atelectasis. 2. Stable bilateral chest tubes without pneumothorax. 3. Stable small bilateral pleural effusions and bibasilar airspace disease. Electronically Signed   By: San Morelle M.D.   On: February 09, 2019 08:01     Medications:     Scheduled Medications: . sodium chloride   Intravenous Once  . sodium chloride   Intravenous Once  . B-complex with vitamin C  1 tablet Oral Daily  . bisacodyl  10 mg Oral Daily   Or  . bisacodyl  10 mg Rectal Daily  . chlorhexidine gluconate (MEDLINE KIT)  15 mL Mouth Rinse BID  . Chlorhexidine Gluconate Cloth  6 each Topical Daily  . feeding supplement (PRO-STAT SUGAR FREE 64)  30 mL Per Tube TID  . insulin aspart  0-20 Units Subcutaneous Q4H  . insulin glargine  14 Units Subcutaneous Q24H  . mouth rinse  15 mL Mouth Rinse 10 times per day  . pantoprazole sodium  40 mg Per Tube BID  . sildenafil  20 mg Per Tube TID  . sodium chloride flush  10-40 mL Intracatheter Q12H  . sodium chloride flush  3 mL Intravenous Q12H    Infusions: .  prismasol BGK 4/2.5 500 mL/hr at 2019/02/09 0213  .  prismasol BGK 4/2.5 400 mL/hr at 02/09/2019 0738  . sodium  chloride 10 mL/hr at 01/19/2019 0800  . feeding supplement (VITAL 1.5 CAL) 45 mL/hr at 02-09-2019 0700  . fentaNYL infusion INTRAVENOUS 150 mcg/hr (Feb 09, 2019 0700)  . heparin 1,900 Units/hr (February 09, 2019 0958)  . midazolam 3 mg/hr (2019/02/09 0700)  . norepinephrine (LEVOPHED) Adult infusion 32 mcg/min (2019-02-09 0900)  . prismasol BGK 4/2.5 2,000 mL/hr at 2019-02-09 0736  . vasopressin (PITRESSIN) infusion - *FOR SHOCK* 0.03 Units/min (02/09/19 0900)    PRN Medications: sodium chloride, calcium chloride, dextrose, fentaNYL, fentaNYL, heparin,  metoprolol tartrate, midazolam, midazolam, morphine injection, ondansetron (ZOFRAN) IV, sodium chloride flush, sodium chloride flush   Assessment/Plan   1.  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  VA ECMO initiated 12/8 and decannulated 12/16.  TEE in OR showed normal RV size and systolic function on 33/43, LVEF 60-65%. Currently on norepinephrine 34 and vasopressin 0.03.  He is now on CVVH, pulling up to 100-150 cc/hr net negative.  Still making some urine.  CVP today 7.  - Should be able to wean pressors today, MAP in 80s.     - Continue iNO for now, eventually back to sildenafil.      - He has diffuse anasarca but CVP is not particularly high. Would like to keep him gently net negative but would not be too aggressive to allow renal function recovery.  Would not go above 100 cc/hr net UF today.    2. AKI:  Suspect due to shock as well as contrast nephropathy with several CTAs. - CVVH ongoing as above.  3. Acute hypoxic/hypoxemic respiratory failure: FiO2 0.9, higher requirement today. New LUL infiltrate.   - See below, for CTA today.  4. ID: WBCs elevated but afebrile.  Now with new LUL infiltrate.  Blood and trach aspirate cultures so far negative.  - Empiric coverage with cefepime per CCM.  - Resend procalcitonin.  5. Pulmonary embolus: Massive bilateral PE with left leg DVT.  He got 150 mg IV total tPA initially,  then catheter directed tPA to both PAs on 12/9 and again on 12/10. CTA 12/14 showed some improvement but still with high clot burden in right PA.  - Discussed with Dr. Prescott Gum, with higher FiO2 will repeat CTA chest for prognosis (and evaluation of LUL),.  - IR-guided thrombectomy is possible option (Dr. Jacqualyn Posey with IR aware of patient).   - Continue heparin gtt, level therapeutic.  6. ENT: ENT bleeding resolved and packing out.  7. Acute blood loss anemia: Stable.  Transfuse hgb < 8.  - Continue IV Protonix.  8. Thrombocytopenia: Plts 144 K this morning, improving.  Suspect due to critical illness.  9. FEN: Continue TFs.    10. Neuro: Daily lighten sedation.   CRITICAL CARE Performed by: Loralie Champagne  Total critical care time: 40 minutes  Critical care time was exclusive of separately billable procedures and treating other patients.  Critical care was necessary to treat or prevent imminent or life-threatening deterioration.  Critical care was time spent personally by me on the following activities: development of treatment plan with patient and/or surrogate as well as nursing, discussions with consultants, evaluation of patient's response to treatment, examination of patient, obtaining history from patient or surrogate, ordering and performing treatments and interventions, ordering and review of laboratory studies, ordering and review of radiographic studies, pulse oximetry and re-evaluation of patient's condition.   Length of Stay: 20  Loralie Champagne, MD  2019/02/16, 9:58 AM  Advanced Heart Failure Team Pager [phone] (M-F; 7a - 4p)  Please contact [facility] for night-coverage after hours (4p -7a ) and weekends on [email or web address]

## 2019-02-17 NOTE — Progress Notes (Signed)
ANTICOAGULATION CONSULT NOTE   Pharmacy Consult for Heparin  Indication: pulmonary embolus (S/P ECMO)  No Known Allergies  Patient Measurements: Height: 5\' 9"  (175.3 cm) Weight: (!) 321 lb 6.9 oz (145.8 kg) IBW/kg (Calculated) : 70.7 Heparin Dosing Weight: 103 kg  Vital Signs: Temp: 96.4 F (35.8 C) (12/18 1000) Temp Source: Bladder (12/18 0800) BP: 129/87 (12/18 0745) Pulse Rate: 75 (12/18 1117)  Labs: Recent Labs    01/27/2019 0500 02/08/2019 0510 02/02/19 0517 02/02/19 0517 02/02/19 0850 02/02/19 1041 02/02/19 1554 02/02/19 1554 02/02/19 1948 02/02/19 2351 02/07/2019 0317 01/21/2019 0418 01/30/2019 1012  HGB 8.0*  --  8.3*   < >  --  8.4* 8.5*   < >  --  8.4*  8.5* 8.4* 8.2* 8.0*  HCT 24.6*  --  26.1*   < >  --  26.5* 26.3*   < >  --  26.1*  25.0* 26.3* 24.0* 25.9*  PLT 97*   < > 124*  --   --  134* 134*  --   --  137* 144*  --  131*  APTT 57*  --  80*  --   --   --   --   --   --   --  76*  --   --   LABPROT 16.7*  --  17.4*  --   --   --   --   --   --   --  17.3*  --   --   INR 1.4*  --  1.4*  --   --   --   --   --   --   --  1.4*  --   --   HEPARINUNFRC  --    < >  --   --   --  0.23*  --   --  0.36  --  0.37  --   --   CREATININE 2.75*   < > 2.09*  --  2.15*  --  1.90*  --   --   --  1.89*  --   --    < > = values in this interval not displayed.    Estimated Creatinine Clearance: 54 mL/min (A) (by C-G formula based on SCr of 1.89 mg/dL (H)).  Assessment: 69 yr old male admitted with massive PE and RV failure. Pt received 150 mg total of systemic tPA with continued shock, so ECMO started 12/8. EKOS started along with IV heparin, then repeated 12/10 with significant clot burden. RV function improving on ECHO 12/11 - holding off further tPA/thrombectomy. Patient was de-cannulated from ECMO on 45/80 without complications.  Heparin level this am is therapeutic after increase to 1800 units/hr.  D/w surgery and they would like level over 0.4 so will increase rate.  No  bleeding or issues with infusion noted.   Goal of Therapy:  Heparin level 0.3-0.7 Monitor platelets by anticoagulation protocol: Yes    Plan:  Increase Heparin to 1900 units/hr Monitor daily heparin level, CBC Monitor for signs/symptoms of bleeding  Erin Hearing PharmD., BCPS Clinical Pharmacist 02/13/2019 11:45 AM

## 2019-02-17 DEATH — deceased

## 2021-01-01 IMAGING — CT CT ANGIO CHEST
2 of 6 series · 18 of 36 positions shown · IV contrast (omnipaque)
Comparison: Chest x-ray from earlier in the same day.

CLINICAL DATA: Shortness of breath and recent CPR

EXAM:
CT ANGIOGRAPHY CHEST WITH CONTRAST
TECHNIQUE: Multidetector CT imaging of the chest was performed using the
standard protocol during bolus administration of intravenous
contrast. Multiplanar CT image reconstructions and MIPs were
obtained to evaluate the vascular anatomy.
CONTRAST:  75mL OMNIPAQUE IOHEXOL 350 MG/ML SOLN

[Series 4: thins · axial · 0.98mm/px · z∈[-617,-340]mm · 17 of 309 slices shown]
[im 16/309  lung]
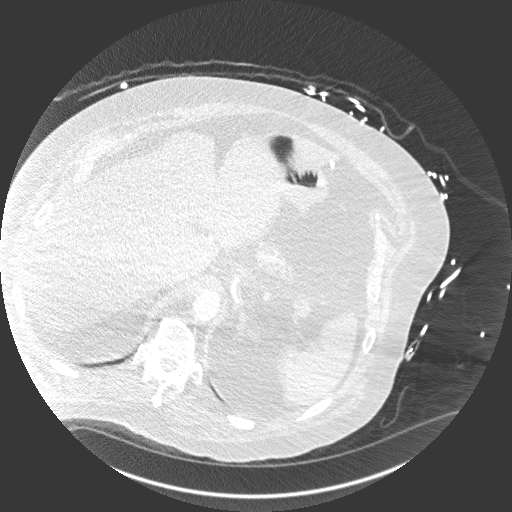
[im 31/309  mediastinal]
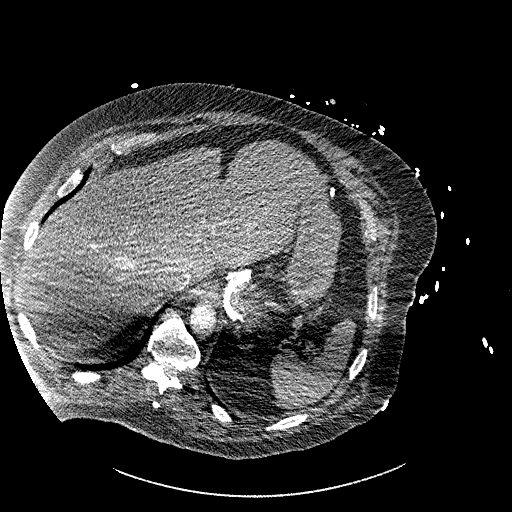
[im 47/309  lung]
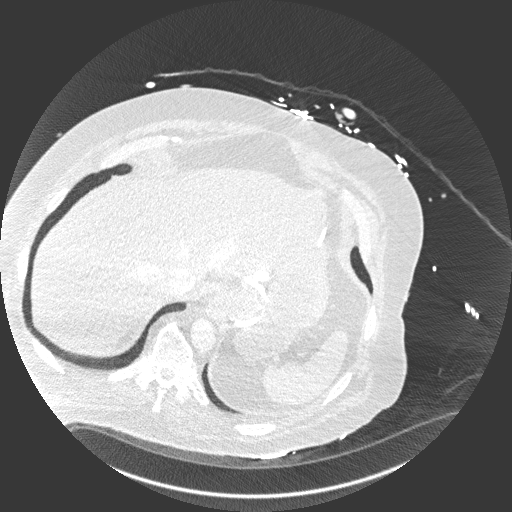
[im 62/309  mediastinal]
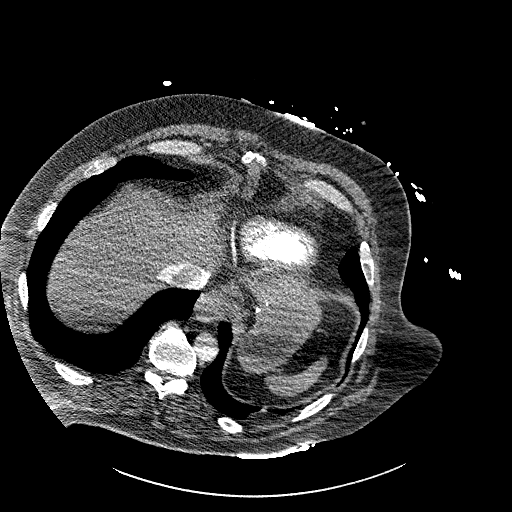
[im 93/309  lung]
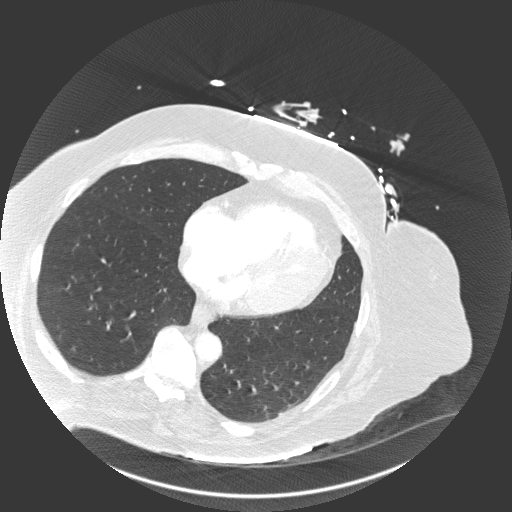
[im 108/309  mediastinal]
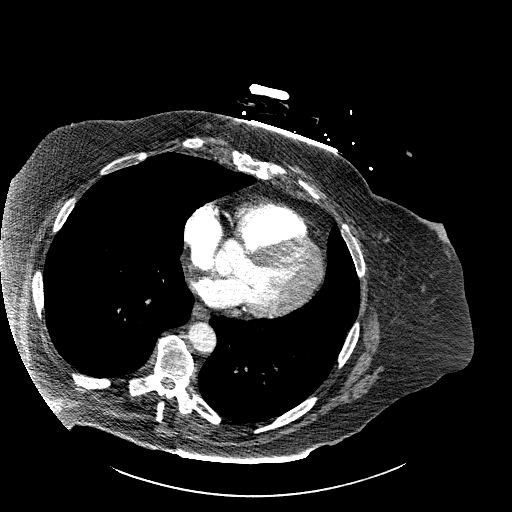
[im 124/309  lung]
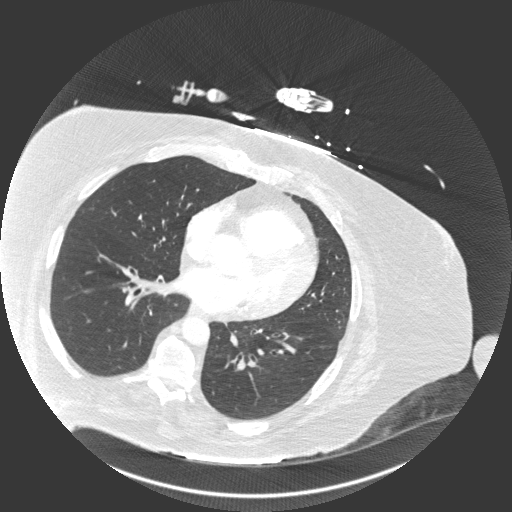
[im 139/309  mediastinal]
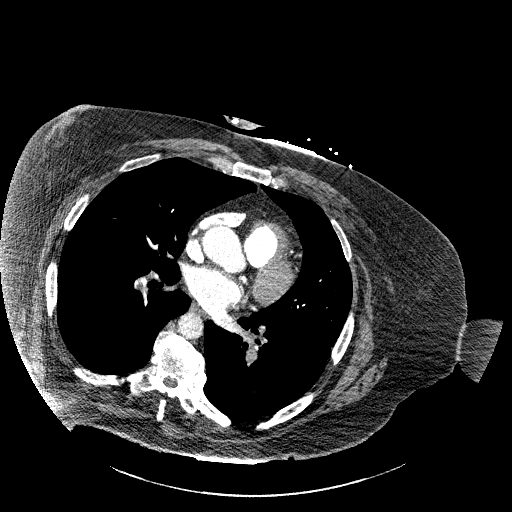
[im 155/309  lung]
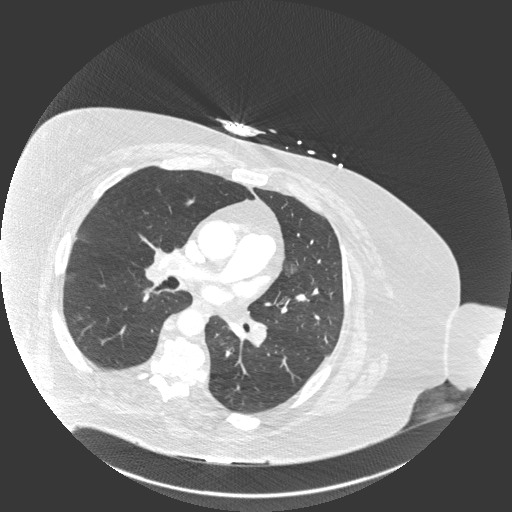
[im 170/309  mediastinal]
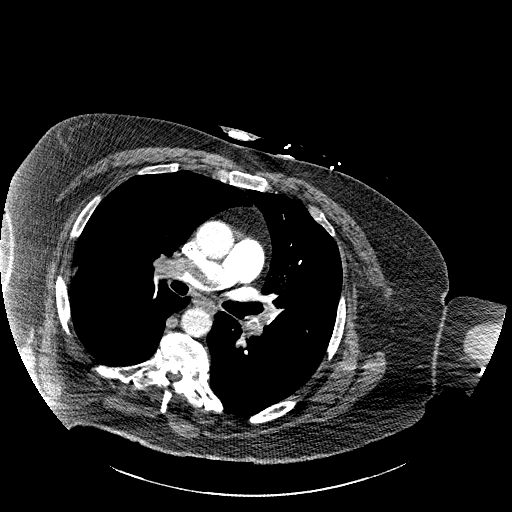
[im 185/309  lung]
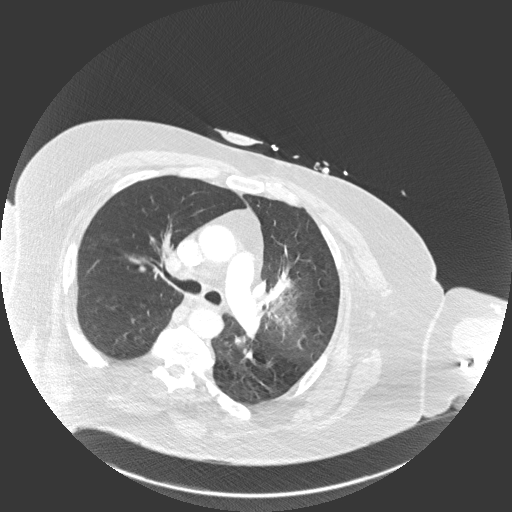
[im 201/309  mediastinal]
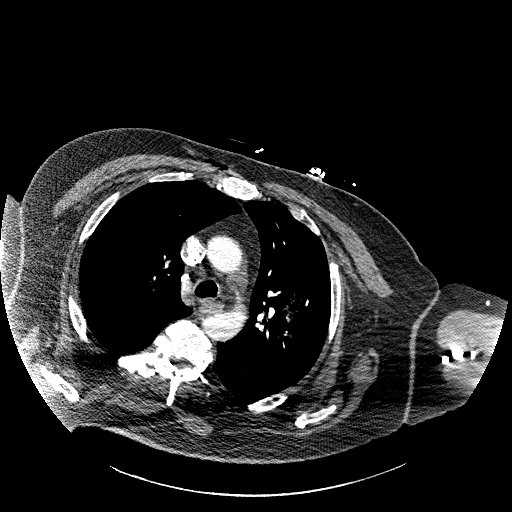
[im 216/309  lung]
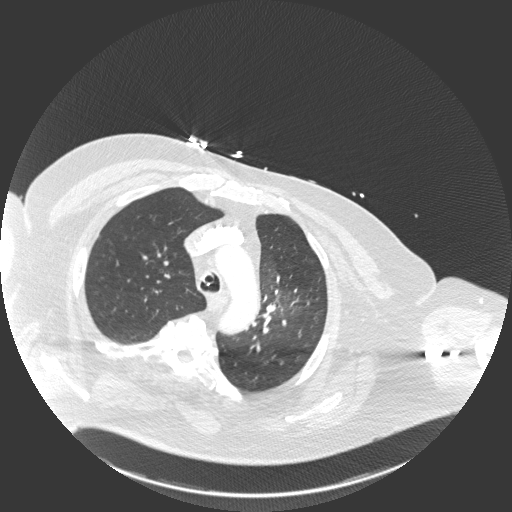
[im 247/309  mediastinal]
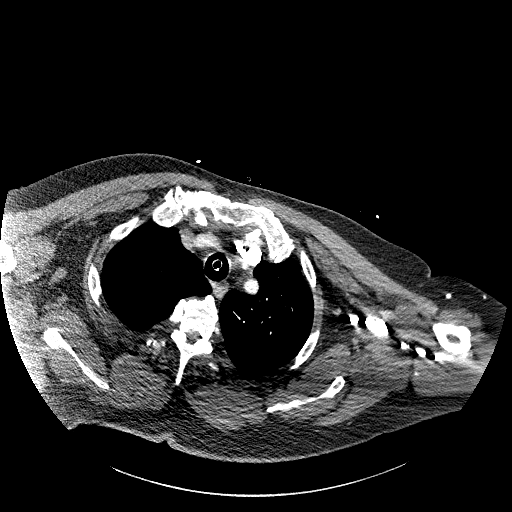
[im 262/309  lung]
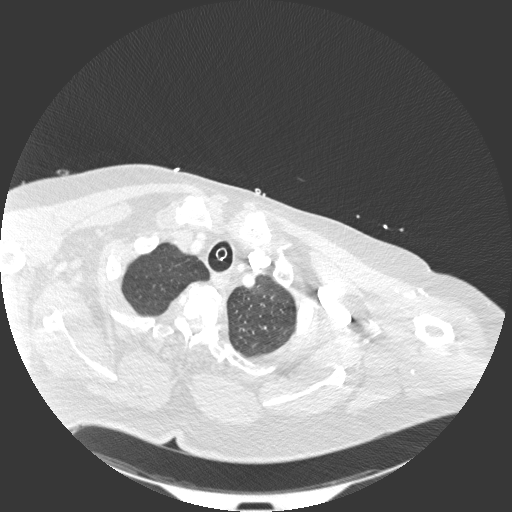
[im 278/309  mediastinal]
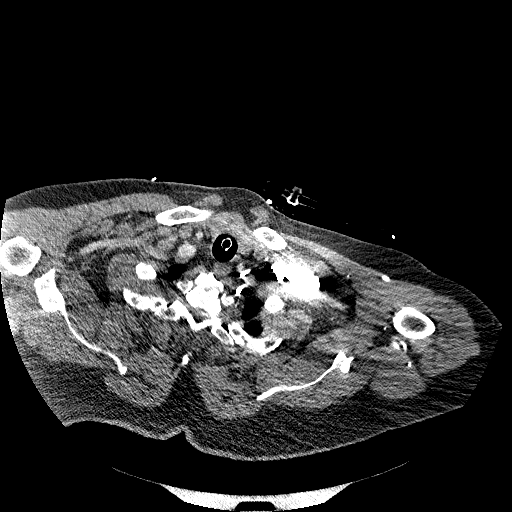
[im 293/309  lung]
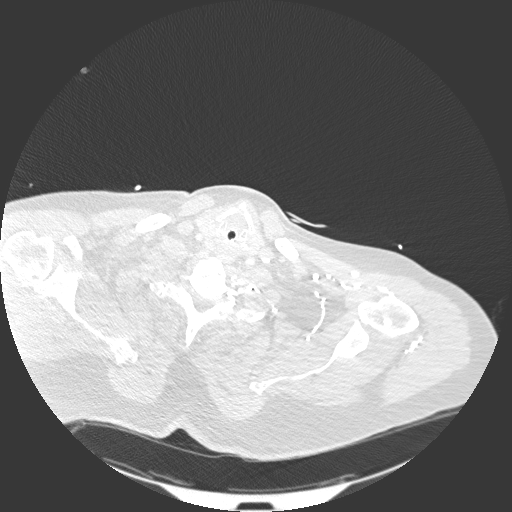

[Series 6: coronal mpr · coronal · 0.64mm/px · 1 of 191 slices shown]
[im 96/191  mediastinal]
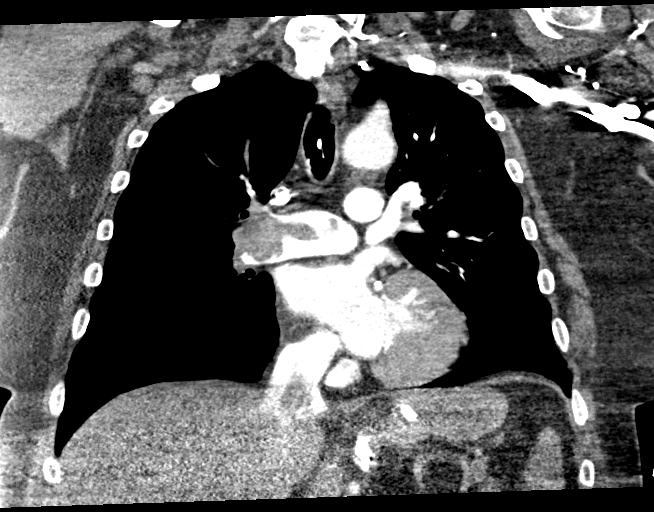

[18 of 36 positions shown; findings below may reference images not displayed]

FINDINGS: Cardiovascular: Thoracic aorta demonstrates mild atherosclerotic
calcifications. No aneurysmal dilatation or dissection is seen.
Pulmonary artery is well visualized within normal branching pattern.
Large bilateral pulmonary emboli are identified with near complete
loss of pulmonary arterial flow on the right and significant
decrease on the left. Changes consistent with right heart strain are
noted.

Mediastinum/Nodes: Thoracic inlet is within normal limits. No hilar
or mediastinal adenopathy is noted. The esophagus as visualized is
within normal limits. Endotracheal tube is noted in satisfactory
position.

Lungs/Pleura: Lungs are well aerated bilaterally. Patchy
ground-glass infiltrate is noted in the left upper lobe in a
perihilar distribution. No other focal infiltrate is seen. No
sizable effusion or pneumothorax is noted.

Upper Abdomen: Visualized upper abdomen shows gastric lap band in
satisfactory position. No other focal abnormality is noted.

Musculoskeletal: Degenerative changes of the thoracic spine are
seen. Some undisplaced rib fractures are noted anteriorly involving
the fourth, fifth and sixth ribs on the right.

Review of the MIP images confirms the above findings.
IMPRESSION: Massive bilateral central pulmonary emboli with significant decrease
in pulmonary arterial flow. Right heart strain is noted.

Patchy ground-glass infiltrate in the left upper lobe. Patient has
negative [id number] status and this may represent acute pneumonic
infiltrate or sequelae from the known pulmonary emboli.

Undisplaced rib fractures on the right as described without
pneumothorax.

No other focal abnormality is noted.

Aortic Atherosclerosis ([id number]).

Critical Value/emergent results were called by telephone at the time
of interpretation on 01/23/2019 at [DATE] to Dr. Don Lolito, who verbally
acknowledged these results.
